# Patient Record
Sex: Male | Born: 1960 | Race: White | Hispanic: No | Marital: Married | Smoking: Never smoker
Health system: Southern US, Community
[De-identification: ages and names within clinical notes are randomized; demographics above are authoritative.]

## PROBLEM LIST (undated history)

## (undated) DIAGNOSIS — M1990 Unspecified osteoarthritis, unspecified site: Secondary | ICD-10-CM

## (undated) DIAGNOSIS — F439 Reaction to severe stress, unspecified: Secondary | ICD-10-CM

## (undated) DIAGNOSIS — F419 Anxiety disorder, unspecified: Secondary | ICD-10-CM

## (undated) DIAGNOSIS — I10 Essential (primary) hypertension: Secondary | ICD-10-CM

## (undated) DIAGNOSIS — G4730 Sleep apnea, unspecified: Secondary | ICD-10-CM

## (undated) DIAGNOSIS — F32A Depression, unspecified: Secondary | ICD-10-CM

## (undated) DIAGNOSIS — I252 Old myocardial infarction: Secondary | ICD-10-CM

## (undated) DIAGNOSIS — E669 Obesity, unspecified: Secondary | ICD-10-CM

## (undated) DIAGNOSIS — F329 Major depressive disorder, single episode, unspecified: Secondary | ICD-10-CM

## (undated) DIAGNOSIS — E785 Hyperlipidemia, unspecified: Secondary | ICD-10-CM

## (undated) DIAGNOSIS — C61 Malignant neoplasm of prostate: Secondary | ICD-10-CM

## (undated) DIAGNOSIS — G4733 Obstructive sleep apnea (adult) (pediatric): Secondary | ICD-10-CM

## (undated) DIAGNOSIS — I2510 Atherosclerotic heart disease of native coronary artery without angina pectoris: Secondary | ICD-10-CM

## (undated) HISTORY — PX: TONSILLECTOMY: SUR1361

## (undated) HISTORY — DX: Essential (primary) hypertension: I10

## (undated) HISTORY — DX: Sleep apnea, unspecified: G47.30

## (undated) HISTORY — DX: Old myocardial infarction: I25.2

## (undated) HISTORY — PX: CORONARY STENT PLACEMENT: SHX1402

## (undated) HISTORY — DX: Obesity, unspecified: E66.9

## (undated) HISTORY — DX: Major depressive disorder, single episode, unspecified: F32.9

## (undated) HISTORY — DX: Unspecified osteoarthritis, unspecified site: M19.90

## (undated) HISTORY — DX: Atherosclerotic heart disease of native coronary artery without angina pectoris: I25.10

## (undated) HISTORY — DX: Hyperlipidemia, unspecified: E78.5

## (undated) HISTORY — DX: Obstructive sleep apnea (adult) (pediatric): G47.33

## (undated) HISTORY — DX: Depression, unspecified: F32.A

## (undated) HISTORY — DX: Anxiety disorder, unspecified: F41.9

## (undated) HISTORY — DX: Reaction to severe stress, unspecified: F43.9

---

## 1980-02-19 HISTORY — PX: KNEE ARTHROSCOPY: SUR90

## 1988-02-19 HISTORY — PX: ANTERIOR CRUCIATE LIGAMENT REPAIR: SHX115

## 2000-06-18 ENCOUNTER — Encounter: Admission: RE | Admit: 2000-06-18 | Discharge: 2000-06-18 | Payer: Self-pay | Admitting: Sports Medicine

## 2000-06-18 ENCOUNTER — Encounter: Payer: Self-pay | Admitting: Sports Medicine

## 2000-06-30 ENCOUNTER — Encounter: Payer: Self-pay | Admitting: Sports Medicine

## 2000-08-14 ENCOUNTER — Encounter: Admission: RE | Admit: 2000-08-14 | Discharge: 2000-08-14 | Payer: Self-pay | Admitting: Sports Medicine

## 2000-08-14 ENCOUNTER — Encounter: Payer: Self-pay | Admitting: Sports Medicine

## 2000-09-01 ENCOUNTER — Encounter: Payer: Self-pay | Admitting: Sports Medicine

## 2000-09-01 ENCOUNTER — Encounter: Admission: RE | Admit: 2000-09-01 | Discharge: 2000-09-01 | Payer: Self-pay | Admitting: Sports Medicine

## 2001-07-09 ENCOUNTER — Encounter: Payer: Self-pay | Admitting: Emergency Medicine

## 2001-07-09 ENCOUNTER — Emergency Department (HOSPITAL_COMMUNITY): Admission: EM | Admit: 2001-07-09 | Discharge: 2001-07-09 | Payer: Self-pay | Admitting: Emergency Medicine

## 2003-02-19 DIAGNOSIS — I252 Old myocardial infarction: Secondary | ICD-10-CM

## 2003-02-19 HISTORY — DX: Old myocardial infarction: I25.2

## 2003-02-23 ENCOUNTER — Inpatient Hospital Stay (HOSPITAL_COMMUNITY): Admission: EM | Admit: 2003-02-23 | Discharge: 2003-02-28 | Payer: Self-pay | Admitting: Emergency Medicine

## 2003-03-14 HISTORY — PX: US ECHOCARDIOGRAPHY: HXRAD669

## 2003-03-21 ENCOUNTER — Encounter (HOSPITAL_COMMUNITY): Admission: RE | Admit: 2003-03-21 | Discharge: 2003-04-19 | Payer: Self-pay | Admitting: Cardiology

## 2004-04-09 ENCOUNTER — Observation Stay (HOSPITAL_COMMUNITY): Admission: EM | Admit: 2004-04-09 | Discharge: 2004-04-10 | Payer: Self-pay | Admitting: Emergency Medicine

## 2006-07-23 ENCOUNTER — Inpatient Hospital Stay (HOSPITAL_BASED_OUTPATIENT_CLINIC_OR_DEPARTMENT_OTHER): Admission: RE | Admit: 2006-07-23 | Discharge: 2006-07-23 | Payer: Self-pay | Admitting: Cardiology

## 2006-07-29 ENCOUNTER — Observation Stay (HOSPITAL_COMMUNITY): Admission: RE | Admit: 2006-07-29 | Discharge: 2006-07-30 | Payer: Self-pay | Admitting: Cardiology

## 2007-10-08 ENCOUNTER — Inpatient Hospital Stay (HOSPITAL_COMMUNITY): Admission: AD | Admit: 2007-10-08 | Discharge: 2007-10-10 | Payer: Self-pay | Admitting: Cardiology

## 2007-10-08 ENCOUNTER — Inpatient Hospital Stay (HOSPITAL_BASED_OUTPATIENT_CLINIC_OR_DEPARTMENT_OTHER): Admission: RE | Admit: 2007-10-08 | Discharge: 2007-10-08 | Payer: Self-pay | Admitting: Cardiology

## 2007-10-09 HISTORY — PX: CARDIAC CATHETERIZATION: SHX172

## 2008-11-23 HISTORY — PX: CARDIOVASCULAR STRESS TEST: SHX262

## 2010-07-03 NOTE — H&P (Signed)
NAME:  [name] [name], LAUNER             ACCOUNT NO.:  [id number]   MEDICAL RECORD NO.:  [id number]           PATIENT TYPE:   LOCATION:                                 FACILITY:   PHYSICIAN:  Colleen Can. [name], M.D.    DATE OF BIRTH:   DATE OF ADMISSION:  10/08/2007  DATE OF DISCHARGE:                              HISTORY & PHYSICAL   CHIEF COMPLAINT:  Chest pain.   HISTORY OF PRESENT ILLNESS:  Mr. Methot is a 50 year old white male who  has known ischemic heart disease.  He has severe single vessel disease  that has been known and has had previous unsuccessful attempts at  revascularization.  He presents to our office after an approximate 14-  month absence with recurrent chest pain.  It is basically been occurring  over the course of the past week.  It is exertional in nature.  For the  past month or so, he has been off all of his medicines, primarily due to  cost reasons.  He has been under more in the way of situational stress.  His discomfort is described as tightness in the mid sternal region that  radiates throughout the precordium.  It is actually worse than his  previous chest pain syndrome.  He has used nitroglycerin with  questionable relief.  He was seen in the office.  Blood pressure was  grossly elevated.  Antihypertensives were reinitiated.  He was seen back  in followup on September 30, 2007 and had had some improvement in blood  pressure control, however, he continued to have some exertional angina.  In light of his ongoing symptomatology and known history of ischemic  heart disease, he is now referred for repeat cardiac catheterization.   PAST MEDICAL HISTORY:  1. Inferior MI in January 2005 and at that time he had stenting of the      right coronary with the 3.5 x 18 mm Cypher stent.  His last cath      was in June 2008 and at that time he had unsuccessful attempts at      angioplasty of the right coronary.  Other findings at that time      showed normal LV function and  30% narrowing in the LAD and left      circumflex.  In the right coronary, he does have left-to-right      collaterals for a large posterior descending and posterolateral      branch.  The right coronary artery itself is somewhat tortuous and      has a severe segmental stenosis prior to the stent and there is in-      stent stenosis.  The distal stent in the posterior descending      appears to be occluded at the origin of the posterior descending      and there were diffuse irregularities and tortuosity in the vessel      overall.  2. Hyperlipidemia.  3. Hypertension.  4. Obesity.  5. Significant situational stress.  6. Degenerative joint disease.  7. History of arthroscopy of the knee.  8. Suspected sleep apnea.  9. Anxiety and depression.   ALLERGIES:  None.   CURRENT MEDICATIONS:  1. Caduet 5/40 daily.  2. Metoprolol 50 mg b.i.d.  3. Aspirin 325 a day.  4. Tricor 145 a day.   FAMILY HISTORY:  Mother has had hypertension.  There are no reports of  myocardial infarction.   SOCIAL HISTORY:  He is married.  He does not smoke and only has rare  alcohol use.  Two of his children have had extensive drug problems.   REVIEW OF SYSTEMS:  Basically as noted above and is otherwise  unremarkable.   PHYSICAL EXAMINATION:  VITAL SIGNS:  His weight is 255, blood pressure  132/90 sitting, 130/90 standing, heart rate 64 and regular, respirations  18, and he is afebrile.  SKIN:  Warm and dry.  Color is unremarkable.  LUNGS:  Basically clear.  HEART:  Regular rhythm.  ABDOMEN:  Obese, soft, positive bowel sounds, and nontender.  EXTREMITIES:  Without edema.  NEUROLOGIC:  No gross focal deficits.   Pertinent labs are pending.   OVERALL IMPRESSION:  1. Unstable angina.  2. Known single-vessel coronary disease with previous unsuccessful      attempts at angioplasty in June 2008.  3. Hypertension.  4. Hyperlipidemia.  5. Obesity.  6. Significant situational stress.   PLAN:  We  will proceed on with diagnostic cardiac catheterization.  Procedure risks and benefits have all been reviewed with the patient and  he is willing to proceed on Thursday, October 08, 2007.      Sharlee Blew, N.P.      Colleen Can. [name], M.D.  Electronically Signed    LC/MEDQ  D:  09/30/2007  T:  10/01/2007  Job:  [id number]

## 2010-07-03 NOTE — Discharge Summary (Signed)
[name] [name], [name] [name]             ACCOUNT NO.:  [id number]   MEDICAL RECORD NO.:  [id number]          PATIENT TYPE:  INP   LOCATION:  2807                         FACILITY:  [facility]   PHYSICIAN:  Colleen Can. [name], M.D.DATE OF BIRTH:  Jan 05, 1961   DATE OF ADMISSION:  10/08/2007  DATE OF DISCHARGE:  10/10/2007                               DISCHARGE SUMMARY   DISCHARGE DIAGNOSES:  1. Chest pain with elective cardiac catheterization performed on      October 08, 2007, showing 95% severe mid lesion in the LAD,      irregularities of the left circumflex with left and right      collaterals, 100% occluded right coronary with an ejection fraction      of 45%-50% and mild inferior and anterior hyperkinesia.  Left main      was normal.  He subsequently was referred for stent placement of      the left anterior descending, which was performed on October 09, 2007.  He has a 3.0 x 15 mm PROMUS stent placed with an overall      satisfactory result obtained.  2. Known atherosclerotic cardiovascular disease with a previous      inferior myocardial infarction in January 2005, and at that time,      he had stenting of the right coronary.  This was subsequently      occluded.  3. Hyperlipidemia.  4. Hypertension.  5. Obesity.  6. Significant situational stress.  7. Degenerative joint disease.  8. Suspected sleep apnea.  9. Anxiety and depression.   HISTORY OF PRESENT ILLNESS:  The patient is a 50 year old white male who  has known ischemic heart disease.  He had severe single-vessel disease  that was known, and he had had prior unsuccessful attempts of  revascularization approximately 14 months ago.  He is visiting our  office after an approximate 1 year absence with recurrent chest pain  that had been occurring over the course of 1 week, it was exertional in  nature.  He had been off all of his medicines primarily due to cost  reasons and had been under more in the way of significant  situational  stress with family issues.  He was subsequently referred for outpatient  cardiac catheterization once his blood pressure was controlled.  He did  continue to have ongoing symptomatology.   Please see the history and physical for further patient presentation and  profile.   LABORATORY DATA:  On admission, CBC was normal.  PT and PTT were  unremarkable.  Chemistry showed a glucose of 106, total cholesterol of  168, triglycerides 235, HDL 43, and LDL of 77.   HOSPITAL COURSE:  The patient was admitted originally as an outpatient  in order to undergo diagnostic cardiac catheterization.  That procedure  was performed on October 08, 2007, without any no known complications.  Ejection fraction was noted to be 45%-50%.  The left main was normal.  The LAD had a severe 95% lesion, mid way.  The left circumflex has  irregularities and the right coronary artery is 100% occluded.  Given  the severity of the nature, the patient was subsequently admitted  overnight, and we proceeded on with attempted stenting of the LAD, the  following morning.  That procedure was also tolerated well without any  no known complications.  A 3.0 x 15 mm PROMUS stent was placed with an  overall excellent result obtained.  Postprocedure, he will be  transferred to 6500, and if he remains stable, he will be able to be  discharged in the morning with further outpatient followup care.   DISCHARGE CONDITION:  Stable.   DISCHARGE DIET:  Low-salt and heart healthy.   WOUND CARE:  He is to use an ice pack as needed to the __________.   DISCHARGE MEDICINES:  1. Plavix 75 mg a day, he is committed to this now for 1 year.  2. Caduet 5/40.  3. Metoprolol 50 mg a day.  4. TriCor 145 a day.  5. Nitroglycerin p.r.n.   We will plan on see him back in the office early next week; certainly,  sooner if any problems arise in the interim.      Sharlee Blew, N.P.      Colleen Can. [name], M.D.   Electronically Signed    LC/MEDQ  D:  10/09/2007  T:  10/09/2007  Job:  [id number]

## 2010-07-03 NOTE — Cardiovascular Report (Signed)
NAMEWILLFORD, [name]             ACCOUNT NO.:  [id number]   MEDICAL RECORD NO.:  [id number]          PATIENT TYPE:  INP   LOCATION:  6525                         FACILITY:  [facility]   PHYSICIAN:  Colleen Can. [name], M.D.DATE OF BIRTH:  27-Dec-1960   DATE OF PROCEDURE:  DATE OF DISCHARGE:                            CARDIAC CATHETERIZATION   PROCEDURE:  Unsuccessful angioplasty attempt of the right coronary  artery.   HISTORY OF PRESENT ILLNESS:  Mr. [name] [name] had had previous inferior  myocardial infarction in January of 2005 with stents to the right  coronary artery and ostial posterior descending.  He had an abnormal  stress Cardiolite study that led to catheterization which showed severe  stenosis in the right coronary artery with the left-to-right collaterals  distally.  He is referred for attempted recanalization of the vessel  leading into the stent.   PROCEDURE:  Attempted angioplasty of the right coronary artery  (unsuccessful).   TYPE AND SITE OF ENTRY:  Percutaneous right femoral artery.   Catheters JR-4 guide, initially without side holes and subsequent with  side holes, initially a Prowater guidewire, and then [employer]acle [employer] 3,  then an extra-support floppy, and subsequently a [place].   MEDICATIONS:  1. Valium 10 mg p.o.  2. Plavix.   MEDICATIONS GIVEN DURING PROCEDURE:  1. Versed total 5 mg IV.  2. AngioMax.  3. Atropine.  4. Dopamine.   COMMENTS:  In general, the patient tolerated the procedure well.  At the  end of the procedure, we had hypotension and bradycardia treated with  atropine and dopamine with resolution.  This was felt to be a  combination of slow flow in the right coronary artery versus possible  vagal reaction.  She did have a vagal reaction while starting an IV  prior to the procedure.   PROCEDURE:  The JR-4 guide provided satisfactory backup.  There was some  damping as procedure went on and as we were changing guidewires we did  change  the JR-4 guide with side holes.  That also provided adequate  backup.  We started with a Prowater guidewire.  However, we did not have  enough pushability to enter into the proximal part of the segmental  stenosis.  We then went with the Mir[employer] 3.  This provided  satisfactory stiffness and torqued ability.  However, as we entered into  the midportion of the stent which had approximately a right angle to it,  we were unable to make the distal turn.  We returned with an extra  support floppy, but that also had difficulty in passing into the more  proximal portions of the lesion with enough strength and stiffness to be  advanced distally.  We did use a 2.5 Maverick balloon to provide support  that did not result in any extra ability to advance guide wires.  We  finished with a mailman guidewire.  As we were advancing that wire and  having difficulty again with the right angle within the stented segment,  Mr. [name] [name] began to have bradycardia, hypotension and diaphoresis.  Angiographically, he had slow flow.  There was no perforation  noted.  We  terminated the procedure at this point in time.  The final  angiographic images show persistence of antegrade flow in a TIMI grade  II manner with distal collaterals present.  The right femoral artery  site was closed with AngioSeal.  The patient was transported to the  holding area in stable condition.      Colleen Can. [name], M.D.  Electronically Signed     SNT/MEDQ  D:  07/29/2006  T:  07/30/2006  Job:  [id number]

## 2010-07-03 NOTE — H&P (Signed)
NAME:  [name] [name], [name] [name]             ACCOUNT NO.:  [id number]   MEDICAL RECORD NO.:  [id number]           PATIENT TYPE:   LOCATION:                               FACILITY:  [facility]   PHYSICIAN:  Colleen Can. [name], M.D.DATE OF BIRTH:  [birth year]   DATE OF ADMISSION:  07/29/2006  DATE OF DISCHARGE:                              HISTORY & PHYSICAL   CHIEF COMPLAINT:  None.   HISTORY OF PRESENT ILLNESS:  Mr. Kudrna is a 50 year old white male who  has known ischemic heart disease.  He has underwent recent diagnostic  cardiac catheterization following an abnormal Cardiolite study.  This  demonstrated severe single vessel disease with severe stenosis in the  right coronary with left-to-right collaterals.  He is now presenting for  elective intervention.  Clinically he has had no complaints of chest  pain.   PAST MEDICAL HISTORY:  1. Inferior MI in January 2005, at that time he had stent placement to      the right coronary with a 3.5 x 18 mm Cypher stent.  His last      catheterization was on July 23, 2006 which showed essentially normal      LV function, severe single vessel disease with a severe stenosis in      the right coronary with left-to-right collaterals distally, but      with severe stenosis within the stented segment of the right      coronary, but also severe stenosis just prior to the origin of the      stent.  2. Hyperlipidemia.  3. Significant situational stress.  4. Obesity.  5. Hypertension.  6. Degenerative joint disease.  7. History of knee arthroscopy.  8. Suspected sleep apnea.   ALLERGIES:  Are none.   CURRENT MEDICATIONS:  1. Metoprolol 50 mg a day.  2. Lisinopril 20 mg a day.  3. Zocor 80 a day.  4. Hydrochlorothiazide 25 a day.  5. Wellbutrin 300 a day.  6. Tricor 145 a day.  7. Multivitamin daily.  8. Lexapro 10 mg a day.  9. Aspirin daily.   FAMILY HISTORY:  Unchanged.   SOCIAL HISTORY:  Unchanged.   REVIEW OF SYSTEMS:  Is unchanged.   PHYSICAL EXAMINATION:  VITAL SIGNS:  His weight 253-1/2, blood pressure  140/90, heart rate 84, respirations 18, afebrile.  Skin is warm and dry.  Color is unremarkable.  LUNGS: Clear.  HEART:  Shows a regular rhythm.  ABDOMEN:  Obese and soft.  Positive bowel sounds, nontender.  EXTREMITIES: Without edema.  NEUROLOGY:  Shows no gross focal deficit.   Repeat labs are pending.   OVERALL IMPRESSION:  1. Severe stenosis in the right coronary artery.  2. Recent abnormal stress Cardiolite.  3. Known ischemic heart disease with previous inferior myocardial      infarction in January 2005.  4. Hypertension.  5. Hyperlipidemia.  6. Obesity.  7. Situational stress.   PLAN:  We will proceed on with attempts at percutaneous coronary  intervention to the right coronary.  The patient has been started on  Plavix 75 mg a day.  Risks, procedure  and benefits have all been  explained and he is willing to proceed on Tuesday July 29, 2006.      Sharlee Blew, N.P.      Colleen Can. [name], M.D.  Electronically Signed    LC/MEDQ  D:  07/25/2006  T:  07/25/2006  Job:  [id number]

## 2010-07-03 NOTE — Cardiovascular Report (Signed)
NAMEAHMET, SCHANK             ACCOUNT NO.:  [id number]   MEDICAL RECORD NO.:  [id number]          PATIENT TYPE:  INP   LOCATION:  2807                         FACILITY:  [facility]   PHYSICIAN:  Colleen Can. [name], M.D.DATE OF BIRTH:  [birth year]   DATE OF PROCEDURE:  DATE OF DISCHARGE:                            CARDIAC CATHETERIZATION   PROCEDURE:  Stent placement in the mid left anterior descending  coronary.   TYPE AND SITE OF ENTRY:  Percutaneous right femoral artery.   CATHETERS:  6-French JL-4 left coronary catheter, 2.5 x 15 mm apex  balloon, Prowater guidewire, 3.0 x 15 mm Promus stent (drug-eluting).   ANTICOAGULANTS:  Aspirin, Plavix, and Angiomax.   SEDATION:  Valium 10 mg p.o. preop and 4 mg of Versed during the  procedure.   COMMENTS:  The patient tolerated the procedure well.  He did have his  classical angina after only short balloon inflations relieved with  deflating the balloon.   PROCEDURE:  Guidewires passed through the stenosis in the left anterior  descending.  We predilated with a 15-mm apex balloon and thought that a  15-mm stent would be satisfactory.  We returned with a Promus stent in  position and inflated it to 16 atmospheres maximum.  This resulted in  full expansion with what appeared to be an excellent and relatively  smooth angiographic result.  The stent was just a fraction of a  millimeter bigger than the native vessel on either end of the stent and  appeared to be fully expanded and so we did not return with post-  dilatation balloon.   OVERALL IMPRESSION:  1. Successful stent placement in the mid left anterior descending with      a drug-eluting stent.      Colleen Can. [name], M.D.  Electronically Signed     SNT/MEDQ  D:  10/09/2007  T:  10/09/2007  Job:  [id number]

## 2010-07-03 NOTE — H&P (Signed)
NAME:  [name] [name], [name] [name]          ACCOUNT NO.:  [id number]   MEDICAL RECORD NO.:  [id number]           PATIENT TYPE:   LOCATION:                               FACILITY:  [facility]   PHYSICIAN:  Colleen Can. [name], M.D.    DATE OF BIRTH:   DATE OF ADMISSION:  07/23/2006  DATE OF DISCHARGE:                              HISTORY & PHYSICAL   CHIEF COMPLAINT:  None.   HISTORY OF PRESENT ILLNESS:  [name] [name] is a 50 year old white male who  has known ischemic heart disease.  He is referred for diagnostic cardiac  catheterization due an abnormal stress Cardiolite study.  He was seen  for his 1-year follow-up visit on Jul 07, 2006.  At that time he was  under a significant amount of stress.  He has gained weight.  He has had  no significant episodes of chest pain.  Stress Cardiolite study was  performed on Jul 18, 2006.  He was able to exercise on the standard  Bruce protocol for a total of 11 minutes and 15 seconds.  He had good  exercise tolerance, however, he was limited by the grade.  His blood  pressure response was adequate.  Clinically, he had no complaints of  chest pain.  His EKG was unremarkable.  His Cardiolite images showed  inferolateral ischemia with mild reduction of ejection fraction at 47%.  He is now referred for repeat catheterization.   PAST MEDICAL HISTORY:  1. Inferior myocardial infarction in January 2005.  At that time he      had stent placement to the right coronary with a 3.5 x 18 mm Cypher      stent.  He had follow-up catheterization in February 2006 which      showed normal LV function.  The stent in the right coronary was      patent.  There were minimal irregularities throughout all three      vessels.  2. Hyperlipidemia.  3. Significant situational stress.  4. Obesity.  5. Hypertension.  6. Degenerative joint disease.  7. History of knee arthroscopy.   ALLERGIES:  NONE.   CURRENT MEDICINES INCLUDE:  Metoprolol 50 mg extended release daily, 20  mg of  lisinopril daily, Zocor 80 mg a day, hydrochlorothiazide 25 mg a  day, Wellbutrin 300 mg a day, TriCor 45 a day, multivitamin daily,  Lexapro 10 mg a day and aspirin daily.   FAMILY HISTORY:  His mother has had hypertension.  There is no previous  report of heart attack.   SOCIAL HISTORY:  He is married.  He has three children.  He has no  tobacco use.  He does have social alcohol use.   REVIEW OF SYSTEMS:  As noted above and is otherwise unremarkable.   PHYSICAL EXAMINATION:  VITAL SIGNS:  His weight is 253-1/2.  Blood  pressure is 140/90 sitting, 130/90 standing.  Heart rate is 84 and  regular, respirations 18, afebrile.  SKIN:  Warm and dry.  Color is unremarkable.  LUNGS:  Basically clear.  HEART:  Shows a regular rhythm.  ABDOMEN:  Obese.  EXTREMITIES: Without edema.  NEUROLOGIC:  No gross focal deficits.   LABORATORIES:  Pertinent labs are pending.   OVERALL IMPRESSION:  1. Abnormal stress Cardiolite study.  2. Known ischemic heart disease with previous inferior myocardial      infarction in January 2005 with subsequent stent placement to the      right coronary.  3. Hypertension.  4. Hyperlipidemia.  5. Significant situational stress.   PLAN:  We will proceed on with repeat cardiac catheterization.  Procedure risks and benefits have all been explained and he is willing  to proceed on Wednesday, June 4.      Sharlee Blew, N.P.      Colleen Can. [name], M.D.  Electronically Signed    LC/MEDQ  D:  07/22/2006  T:  07/22/2006  Job:  [id number]

## 2010-07-03 NOTE — Cardiovascular Report (Signed)
NAME:  [name] [name], [name] [name]             ACCOUNT NO.:  [id number]   MEDICAL RECORD NO.:  [id number]          PATIENT TYPE:  OIB   LOCATION:  1961                         FACILITY:  [facility]   PHYSICIAN:  Colleen Can. [name], M.D.DATE OF BIRTH:  [birth year]   DATE OF PROCEDURE:  07/23/2006  DATE OF DISCHARGE:                            CARDIAC CATHETERIZATION   HISTORY:  Mr. Paparella had an inferior myocardial infarction in January  2005 with stents in the right coronary artery.  He presented for  followup but had an abnormal stress Cardiolite study.  He is referred  for evaluation.   PROCEDURE:  Left heart catheterization with selective coronary  angiography, left ventricular angiography.   TYPE AND SITE OF ENTRY:  Percutaneous right femoral artery.   CATHETERS:  Four-French four curved Judkins right and left coronary  catheters, 4-French pigtail ventriculographic catheter.   CONTRAST MATERIAL:  Omnipaque.   MEDICATIONS GIVEN PRIOR TO PROCEDURE:  Valium 10 mg p.o.   MEDICATIONS GIVEN DURING THE PROCEDURE:  Versed 3 mg IV.   COMMENTS:  The patient tolerated the procedure well.   HEMODYNAMIC DATA:  Aortic pressure of 114/78, LV pressure of 121/0-7.   ANGIOGRAPHIC DATA:  Left ventricular angiography performed in the RAO  position.  Overall cardiac size and silhouette were normal.  Global left  ventricular function showed an ejection fraction of 55-60%.   CORONARY ARTERIES:  1. Left main coronary artery is normal.  2. Left anterior descending had somewhat diffuse 30% narrowing      proximally.  The first and second diagonal vessels were large and      were satisfactory.  3. Left circumflex:  The left circumflex had ostial 30% narrowing.  It      was a moderate-sized vessel.  4. Right coronary artery had left-to-right collaterals for a large      posterior descending and posterolateral branch.  The right coronary      artery itself was somewhat tortuous.  There was severe segmental  stenosis prior to the stent and then there was in-stent stenosis.      There was a distal stent in the posterior descending vessel which      appeared to be occluded at the origin of the posterior descending      vessel.  There was diffuse irregularities and tortuosity in this      vessel.   OVERALL IMPRESSION:  1. Essentially normal left ventricular function.  2. Severe single-vessel coronary disease with severe stenosis in the      right coronary artery with left-to-right collaterals distally but      with severe stenosis within the stented segment of the right      coronary artery but also severe stenosis prior to the origin of the      stent.   DISCUSSION:  We will arrange for elective percutaneous coronary  intervention of the right coronary artery and possible posterior  descending vessel.      Colleen Can. [name], M.D.  Electronically Signed     SNT/MEDQ  D:  07/23/2006  T:  07/23/2006  Job:  [id number]

## 2010-07-03 NOTE — Discharge Summary (Signed)
NAMECLIFFTON, SPRADLEY             ACCOUNT NO.:  [id number]   MEDICAL RECORD NO.:  [id number]          PATIENT TYPE:  INP   LOCATION:  6525                         FACILITY:  [facility]   PHYSICIAN:  Colleen Can. [name], M.D.DATE OF BIRTH:  [birth year]   DATE OF ADMISSION:  07/29/2006  DATE OF DISCHARGE:  07/30/2006                               DISCHARGE SUMMARY   PRIMARY DISCHARGE DIAGNOSIS:  Failed attempts at angioplasty to the  right coronary artery.   SECONDARY DISCHARGE DIAGNOSES:  1. Known severe single-vessel coronary disease with a severe stenosis      in the right coronary artery with left-to-right collaterals.  He      had his last catheterization on July 23, 2006.  He has normal left      ventricular function.  2. Hyperlipidemia.  3. Obesity.  4. Hypertension.  5. Suspected sleep apnea.  6. Significant situational stress.  7. 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   HISTORY OF PRESENT ILLNESS:  The patient is 50 year old white male who  was referred for cardiac catheterization following an abnormal  Cardiolite study.  He has clinically remained without symptoms.  His  catheterization demonstrated severe single vessel disease.  He was  brought in for attempts at angioplasty and revascularization.   Please see the dictated history and physical for further patient  presentation and profile.   LABORATORY DATA:  PT and PTT were unremarkable.  Chemistries were  normal.  CBC was normal.   HOSPITAL COURSE:  The patient was admitted electively.  He had already  been started on Plavix as an outpatient.  We proceeded on with attempts  at angioplasty to the right coronary, however, this was unsuccessful.  The guidewire was unable to the passed across the distal curve in the  right coronary artery after multiple attempts and multiple guidewires.  Procedure was  subsequently aborted.  He did remain stable and was  subsequently watched overnight.  His CK and troponin are negative.  His  groin is unremarkable and he is felt to be a satisfactory candidate for  discharge today.  Further treatment plan to follow for Dr. Ronnald Nian  discretion.   DISCHARGE CONDITION:  Stable.   DISCHARGE MEDICINES:  Will be all as he was taking before which include  1. Metoprolol ER 50 mg a day.  2. Lisinopril 20 mg a day.  3. Zocor 80 mg a day.  4. Hydrochlorothiazide 25 mg a day.  5. Wellbutrin 300 a day.  6. Tricor 145 a day.  7. Multivitamin daily.  8. Lexapro 10 mg a day.  9. Aspirin daily.  10.Plavix daily.   He is to place an ice pack if needed to the groin.   His activity will be increased as tolerated.  He is to return to work on  Monday.  He is to call our office if any problems arise in the interim.      Sharlee Blew, N.P.      Duffy Rhody  Roslynn Amble, M.D.  Electronically Signed    LC/MEDQ  D:  07/30/2006  T:  07/30/2006  Job:  [id number]

## 2010-07-03 NOTE — Cardiovascular Report (Signed)
NAME:  [name] [name], [name] [name]             ACCOUNT NO.:  [id number]   MEDICAL RECORD NO.:  [id number]          PATIENT TYPE:  OIB   LOCATION:  1965                         FACILITY:  [facility]   PHYSICIAN:  Colleen Can. [name], M.D.DATE OF BIRTH:  09-14-1960   DATE OF PROCEDURE:  10/08/2007  DATE OF DISCHARGE:  10/08/2007                            CARDIAC CATHETERIZATION   HISTORY:  Mr. Yoak has had progressive angina over the last 2 weeks.  He has a history of a known occluded right coronary artery with left-to-  right collaterals.   PROCEDURE:  Left heart catheterization with selective coronary  angiography and left ventricular angiography.   TYPE AND SITE OF ENTRY:  Percutaneous right femoral artery.   CATHETERS:  A 4-French 4 curved Judkins left coronary catheter, 4-French  3D RC right coronary catheter, and 4-French pigtail ventriculographic  catheter.   CONTRAST MATERIAL:  Omnipaque.   MEDICATIONS:  Given prior procedure Valium 10 mg p.o.   MEDICATIONS GIVEN PRIOR TO PROCEDURE:  Valium 10 mg p.o.   MEDICATIONS GIVEN DURING THE PROCEDURE:  Versed 4 mg IV.   COMMENTS:  The patient tolerated the procedure well.   HEMODYNAMIC DATA:  The aortic pressure was 123/85, LV was 122/16.  There  is no aortic valve gradient noted on pullback.   ANGIOGRAPHIC DATA:  The left ventricular ejection fraction approximately  45-50%.  There is mild inferior and mild anterior hypokinesis.   CORONARY ARTERIES:  1. Left main coronary artery is normal.  2. Left anterior descending.  The left anterior descending has a      severe 95% stenosis before first diagonal vessel.  It is reasonably      focal and would be well-suited for angioplasty.  3. Intermediate.  Large intermediate vessel free of significant      obstructive disease.  4. Left circumflex.  Left circumflex is moderate size vessel.  It has      irregularities.  5. Right coronary artery.  Right coronary artery is totally occluded  proximally.  There is extensive left-to-right collaterals mainly to      the left circumflex system.   OVERALL IMPRESSION:  1. Severe stenosis in the left anterior descending.  2. Totally occluded right coronary artery with left-to-right      collaterals.  3. Satisfactory intermediate and left circumflex systems.   DISCUSSION:  In light of these findings, it would appear that Mr. Kitchings  should do well with angioplasty of left anterior descending.  This will  be arranged as an elective procedure.      Colleen Can. [name], M.D.  Electronically Signed     SNT/MEDQ  D:  10/08/2007  T:  10/09/2007  Job:  [id number]

## 2010-07-06 NOTE — Discharge Summary (Signed)
NAME:  [name] [name], [name] [name]             ACCOUNT NO.:  [id number]   MEDICAL RECORD NO.:  [id number]          PATIENT TYPE:  INP   LOCATION:  2030                         FACILITY:  [facility]   PHYSICIAN:  Colleen Can. [name], M.D.DATE OF BIRTH:  [birth year]   DATE OF ADMISSION:  04/09/2004  DATE OF DISCHARGE:  04/10/2004                                 DISCHARGE SUMMARY   PRIMARY DISCHARGE DIAGNOSIS:  Atypical chest pain with negative cardiac  enzymes with subsequent elective cardiac catheterization showing normal LV  function, persistent patency in the stent in the right coronary with minimal  irregularities scattered throughout all three vessels with the greatest  lesion being approximately 40% ostial left circumflex.  In light of these  findings, he will continue with medical management.  It was doubtful that  his chest pain syndrome was related to coronary disease.   SECONDARY DISCHARGE DIAGNOSES:  1.  Known history of atherosclerotic cardiovascular disease with a previous      history of inferior wall MI in January 2005.  At that time, he had      stents placed to the right coronary and posterior descending.  2.  Hypertension.  3.  Obesity.  4.  Hyperlipidemia.   HISTORY OF PRESENT ILLNESS:  The patient is a 50 year old white male who  presented to our office after prolonged and steady episode of chest  discomfort.  It began at approximately 11:30 a.m.  Initially, it was  somewhat sharp and stabby but then became somewhat dull and persistent.  He  had no other associated symptoms.  He was subsequently seen in the office  and referred on to the hospital for further evaluation.   Please see the dictated H&P for further patient presentation and profile.   LABORATORY DATA ON ADMISSION:  Chest x-ray showed no active disease.  There  was no evidence of pulmonary edema.  Cardiac enzymes were negative with  negative cardiac markers.  CBC was normal.  Chemistries were normal.  SGPT  slightly  elevated at 44.   Total cholesterol showed a total cholesterol of 147, triglycerides 252.  LDL  was 55 and HDL was 42.  PT and PTT were unremarkable.   A 12-lead electrocardiogram showed no acute changes with evidence of an old  inferior MI.   HOSPITAL COURSE:  The patient was admitted electively.  He was placed on IV  heparin.  His home medicines were continued.  We proceeded on with cardiac  catheterization the following afternoon.  He had no reoccurrence of chest  pain during this admission.  Cardiac catheterization was carried out and  tolerated without any known complications.  The results are as noted above.  It was felt that the chest pain he had presented with was not cardiac in  nature, and he will be continued to be followed along clinically.   DISCHARGE CONDITION:  Stable.   DISCHARGE MEDICATIONS:  He will resume all of his previous medicines at this  point in time.  No changes are made.   DISCHARGE INSTRUCTIONS:  Will plan on seeing him back in the office in  approximately two  weeks, certainly sooner if problems would arise.  His  activity is to be progressive in nature and appropriate wound care  information was given.      LC/MEDQ  D:  04/11/2004  T:  04/11/2004  Job:  [id number]

## 2010-07-06 NOTE — Discharge Summary (Signed)
NAME:  [name] [name], [name] [name]                       ACCOUNT NO.:  [id number]   MEDICAL RECORD NO.:  [id number]                   PATIENT TYPE:  INP   LOCATION:  2009                                 FACILITY:  [facility]   PHYSICIAN:  Colleen Can. [name], M.D.            DATE OF BIRTH:  18-Aug-1960   DATE OF ADMISSION:  02/23/2003  DATE OF DISCHARGE:  02/28/2003                                 DISCHARGE SUMMARY   PRIMARY DISCHARGE DIAGNOSES:  1. Chest pain in the setting of an inferior myocardial infarction with     subsequent stent placement to the right coronary with a 3.5 x 18 mm as     well as a 2.5 x 9 mm Cypher stent.  2. Hyperlipidemia with current initiation of statin therapy.  3. Hypertension, currently controlled.   HISTORY OF PRESENT ILLNESS:  Mr. Lezcano is a 50 year old nonsmoker, white  male who presents with the onset of chest pain. He was seen and evaluated in  the emergency room and noted to have abnormal 12-lead echocardiogram. His  chest pain occurred while he was participating in [name] jousting. He was  subsequently seen and evaluated and taken emergently to the cardiac  catheterization lab.   Please see the dictated history and physical for further patient  presentation and profile.   LABORATORY DATA:  Peak MB was 158, troponin peak was 30. Chemistries were  normal except for glucose of 135. CBC was normal.   HOSPITAL COURSE:  The patient was admitted to the coronary care unit after  undergoing emergent cardiac catheterization. That procedure was performed  without any noted complication. There was an ulcerated plaque in the mid  portion of the right coronary. There was a 95+ percent narrowing in the  posterior descending. A subsequent 3.5 x 18 mm as well as 2.5 x 9 mm Cypher  stents were placed with overall satisfactory results obtained. The left main  was normal. The LAD has irregularities with a high bifurcating diagonal. The  left circumflex has irregularities. The  patient subsequently received  Angioseal and was transferred to the coronary care unit. He basically  progressed quite nicely throughout the remainder of his hospitalization. He  has been up and ambulatory without problems. He has had no further chest  pain. The groin has remained unremarkable, and today on February 28, 2003, he  is doing well and is felt to be a stable candidate for discharge.   DISCHARGE CONDITION:  Stable.   DISCHARGE MEDICATIONS:  1. Plavix 75 mg a day for a total of 6 months.  2. Aspirin daily.  3. Lopressor 50 mg a half a tablet b.i.d.  4. Lipitor 10 q.d.  5. Altace 2.5 daily.  6. Nitroglycerin p.r.n.   ACTIVITY:  His activity is to be light with no driving and no sexual  intercourse. He may walk 10 minutes twice a day.   FOLLOW UP:  We will see him back in  the office in approximately 10 days. He  is asked to call to schedule that appointment.      Juanell Fairly C. Earl Gala, N.P.                 Colleen Can. [name], M.D.    LCO/MEDQ  D:  02/28/2003  T:  02/28/2003  Job:  [id number]

## 2010-07-06 NOTE — H&P (Signed)
NAME:  [name] [name]                       ACCOUNT NO.:  [id number]   MEDICAL RECORD NO.:  [id number]                   PATIENT TYPE:  INP   LOCATION:  1829                                 FACILITY:  [facility]   PHYSICIAN:  Colleen Can. [name], M.D.            DATE OF BIRTH:  08-10-1960   DATE OF ADMISSION:  02/23/2003  DATE OF DISCHARGE:                                HISTORY & PHYSICAL   HISTORY:  Mr. [name] [name] is a 50 year old male who presents with sudden onset of  substernal chest pain.  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  He had onset of substernal chest pain  with shortness of breath and mild diaphoresis.  He presents to the emergency  room after having approximately 30 minutes of chest discomfort.  He has not  had similar symptoms to this before but has had some intermittent episodes  of sweats over the last few weeks.  He has not had previous chest pain.  Chest pain has been partially relieved in the emergency room and clearly is  much improved.  Initially ST segments were elevated, but have improved  somewhat with administration of nitroglycerin.   PAST MEDICAL HISTORY:  1. Mild hypertension.  2. Knee arthroscopy.   ALLERGIES:  None.   CURRENT MEDICATIONS:  None.   SOCIAL HISTORY:  He is married and has three children.  The oldest is 57  Sharlet Salina).  He is a nonsmoker, rare social alcohol.   FAMILY HISTORY:  Mother has had hypertension.  There is no previous  myocardial infarction.   REVIEW OF SYSTEMS:  Essentially negative otherwise.  He does get vagal with  medical procedures quite easily.   PHYSICAL EXAMINATION:  VITAL SIGNS:  Heart rate 50, blood pressure 120/70.  SKIN:  Warm and dry, mildly diaphoretic.  HEENT:  Negative.  LUNGS:  Clear.  HEART:  Regular rate and rhythm.  ABDOMEN: Soft, nontender.  EXTREMITIES:  Peripheral p pulses are intact.   LABORATORY DATA:  EKG shows changes compatible with acute  inferior  myocardial infarction with reciprocal ST changes.   OVERALL IMPRESSION:  1. Acute inferior myocardial infarction.  2. History of mild hypertension.  3. Considerable medical history currently unknown.   PLAN:  We will proceed to the cardiac catheterization lab emergently.  Procedure, risks, and benefits have been explained, and the patient is  willing to proceed.                                                Colleen Can. [name], M.D.    SNT/MEDQ  D:  02/23/2003  T:  02/23/2003  Job:  [id number]

## 2010-07-06 NOTE — Cardiovascular Report (Signed)
NAME:  [name] [name], [name] [name]             ACCOUNT NO.:  [id number]   MEDICAL RECORD NO.:  [id number]          PATIENT TYPE:  INP   LOCATION:  2030                         FACILITY:  [facility]   PHYSICIAN:  Colleen Can. [name], M.D.DATE OF BIRTH:  1961-02-09   DATE OF PROCEDURE:  04/10/2004  DATE OF DISCHARGE:                              CARDIAC CATHETERIZATION   HISTORY:  Mr. Hardgrove presents with atypical chest pain.  He had previous  stents one year ago.  He is referred for catheterization.  Myocardial  infarction was ruled out.   PROCEDURE:  Left heart catheterization with selective coronary angiography,  left ventricular angiography with Angio-Seal.   TYPE AND SITE OF ENTRY:  Percutaneous, right femoral artery.   CATHETERS:  6 French 4 curved Judkins right and left coronary catheters; 6  French pigtail ventriculographic catheter.   CONTRAST MATERIAL:  Omnipaque.   MEDICATIONS GIVEN PRIOR TO PROCEDURE:  Valium 10 mg p.o.   MEDICATIONS GIVEN DURING THE PROCEDURE:  Versed 5 mg IV, Ancef 1 g IV.   COMMENTS:  The patient tolerated the procedure well.   HEMODYNAMIC DATA:  1.  The aortic pressure was 110/70.  2.  LV was 110/0-10.  3.  There are no aortic valve gradient noted on pullback.   ANGIOGRAPHIC DATA:  1.  The right coronary artery is a large dominant vessel.  Stents are      visible.  There is mild irregularities with less than 10-20% narrowing      of the right coronary artery.  Stents were patent.  2.  The left main coronary artery is normal.  3.  The left circumflex had a 40% ostial narrowing of the left circumflex.      He has a relatively large vessel and otherwise free of significant      obstructive disease.  4.  Left anterior descending:  The left anterior descending has a high      diagonal vessel and two other diagonal vessels in the mid portion.      There are irregularities in the proximal mid portion of the left      anterior descending, but no obstructive disease  is present.   Left ventricular angiogram was performed in  RAO position.  Overall cardiac  size and silhouette are normal.  The global ejection is estimated to be at  70%.  Regional wall motion is normal.   OVERALL IMPRESSION:  1.  Normal left ventricular function.  2.  Persistent patency of the stent in the right coronary artery with      minimal irregularities scattered throughout all three vessels with the      greatest lesion being approximately a 40% ostial left circumflex      narrowing.   DISCUSSION:  In light of these, will continue medical  management.  It is  doubtful that Mr. Krauss's chest pain syndrome was in fact related to  coronary disease.      SNT/MEDQ  D:  04/10/2004  T:  04/10/2004  Job:  [id number]

## 2010-07-06 NOTE — H&P (Signed)
NAME:  [name] [name], [name] [name]             ACCOUNT NO.:  [id number]   MEDICAL RECORD NO.:  [id number]          PATIENT TYPE:  EMS   LOCATION:  MAJO                         FACILITY:  [facility]   PHYSICIAN:  Colleen Can. [name], M.D.DATE OF BIRTH:  Sep 28, 1960   DATE OF ADMISSION:  04/09/2004  DATE OF DISCHARGE:                                HISTORY & PHYSICAL   CHIEF COMPLAINT:  Chest pain.   HISTORY OF PRESENT ILLNESS:  Mr. Platten is a 50 year old white male who has  known history of ischemic heart disease.  He presents to the office as a  work-in appointment this afternoon.  He has had a constant steady-type  discomfort since 11:30 this morning.  It is questionable if it is similar to  his previous chest pain syndrome.  Initially it started out as a stabbing  pain over the right side of his chest.  He took nitroglycerin which only  gave him a headache.  He has not been short of breath or diaphoretic after  that.  He was able to eat lunch but then his discomfort was somewhat dull.  It has been lingering on.  He was seen in the office at approximately 3  o'clock p.m.  He was placed on the monitor which was unremarkable.  He  continued to have persistent and dull discomfort which was really difficult  to sort out his symptomatology.  He has a known history of ischemic heart  disease with previous MI one years ago.  He is subsequently admitted for  further evaluation.   PAST MEDICAL HISTORY:  1.  Atherosclerotic cardiovascular disease, inferior MI January of 2005,      with stent placement to the right coronary artery/posterior descending.      He had minimal disease in the left system at that time.  He had a 3.5 x      18 mm CYPHER stent placed in the right coronary and then a subsequent      2.5 x 9 mm CYPHER stent to the posterior descending.  2.  Hypertension.  3.  Obesity.  4.  Hyperlipidemia.  5.  History of knee surgery.   ALLERGIES:  No known drug allergies.   CURRENT MEDICATIONS:  1.  Multivitamin daily.  2.  Lexapro 10 mg a day.  3.  Lopressor 25 b.i.d.  4.  Aspirin daily.  5.  Altace 5 mg a day.  6.  Lipitor 20 mg a day.   FAMILY HISTORY:  Unchanged.   SOCIAL HISTORY:  Unchanged.   REVIEW OF SYMPTOMS:  As noted above and otherwise unremarkable.  He has had  no recent fever or flu.  No cough, no injury.  He is very active with  [name] jousting.  He has had no frank syncope.   PHYSICAL EXAMINATION:  GENERAL APPEARANCE:  He is a pleasant young white  male in no acute distress.  VITAL SIGNS:  Blood pressure 130/90 sitting, 130/100 standing.  Heart rate  60 and regular.  Weight 241 pounds.  SKIN:  Warm and dry.  Color unremarkable.  LUNGS:  Lungs are basically clear.  CARDIOVASCULAR:  Heart shows regular rhythm.  He does have chest wall  tenderness on the right side.  ABDOMEN:  Soft, positive bowel sounds, nontender.  EXTREMITIES:  Without edema.  NEUROLOGIC:  Intact.   PERTINENT LABORATORY DATA:  Pending.   EKG shows no acute changes.   OVERALL IMPRESSION:  1.  Persistent episode of chest pain.  2.  Known ischemic heart disease.  Previous inferior myocardial infarction      with stents to the right coronary artery/posterior descending.  3.  Hypertension.  4.  Hyperlipidemia.  5.  Obesity.   PLAN:  Will proceed on with admission to the hospital.  He is placed on IV  heparin.  His home medications are continued.  We may need to proceed on  with cardiac catheterization on Tuesday.  He will be ruled out for cardiac  enzymes.  Further treatment plan to follow per Dr. Ronnald Nian discretion.      LC/MEDQ  D:  04/09/2004  T:  04/09/2004  Job:  [id number]

## 2010-07-06 NOTE — Cardiovascular Report (Signed)
NAME:  [name] [name], [name] [name]                       ACCOUNT NO.:  [id number]   MEDICAL RECORD NO.:  [id number]                   PATIENT TYPE:  INP   LOCATION:  2309                                 FACILITY:  [facility]   PHYSICIAN:  Colleen Can. [name], M.D.            DATE OF BIRTH:  [birth year]   DATE OF PROCEDURE:  02/23/2003  DATE OF DISCHARGE:                              CARDIAC CATHETERIZATION   HISTORY:  Mr. Delane Ginger presents with an acute inferior myocardial infarction.   PROCEDURE:  Left heart catheterization with selective coronary angiography,  left ventricular angiography and stent placement sequentially in the right  coronary artery and posterior descending.   TYPE AND SITE OF ENTRY:  Percutaneous, right femoral artery with AngioSeal.   CATHETERS:  6-French 4 curved Judkins right and left coronary catheters, 6-  French pigtail ventriculographic catheter, 6-French JR4 guide, high-torque  floppy guidewire, subsequent Luge wire, initially a 3.5 x 18 mm Cypher stent  with follow-up angioplasty proximally with a 4.0 x 15 mm Maverick balloon,  distally a 2.5 x 9 mm Cypher stent after initial angioplasty with a 2.25 x 9  mm Maverick balloon.   CONTRAST MATERIAL:  Omnipaque.   MEDICATIONS GIVEN PRIOR TO THE PROCEDURE:  Valium 10 mg p.o., heparin, IV  nitroglycerin.   MEDICATIONS GIVEN DURING THE PROCEDURE:  Versed, Integrilin, Plavix, Ancef.   COMMENTS:  The patient in general tolerated the procedure well.   HEMODYNAMIC DATA:  1. The aortic pressure was 130/70.  2. LV was 130/10.  3. There was no aortic valve gradient noted on pullback.   ANGIOGRAPHIC DATA:  1. Left main coronary artery is normal.  2. Left circumflex had irregularities, is moderate size vessel.  3. Left anterior descending had diffuse irregularities, no greater than 20-     30% narrowing.  There was a large bifurcating proximal diagonal branch.  4. Right coronary artery:  The right coronary artery is a very large  vessel.     There is an ulcerated plaque in the mid portion.  Posterior descending     vessel had a 95%+ stenosis in proximal portions.   LEFT VENTRICULOGRAM:  Left ventricular angiogram was performed after the  angioplasty and stents.  It showed very minimal inferior hypokinesia.   ANGIOPLASTY PROCEDURE:  We initially changed for a JR4 guide and high-torque  floppy guidewire.  This passed across the initial lesion.  This was in the  mid portion of the right coronary artery before the acute margin.  The 3.5 x  18 mm Cypher stent was appropriate length.  It was dilated initially to 18  atmospheres, subsequently returned with a 4.0 x 15 mm Maverick balloon with  full inflation and dilatation of the stent.  Subsequently, an excellent  result was noted.  We then turned our attention to the posterior descending.  It was difficult to get the guidewire to pass into the main body of the  posterior descending.  Instead I selected a side branch.  However, this was  adequate.  We initially pre dilated with a 2.25 x 9 mm Maverick balloon,  then returned with a 2.5 x 9 mm Cypher stent inflated to a maximum of 18  atmospheres.  The final angiographic result was felt to be excellent.   We then performed AngioSeal after the procedure.  The patient tolerated  well.   OVERALL IMPRESSION:  1. Acute inferior myocardial infarction with disrupted plaque in the large     mid portion of the right coronary artery and a 99% stenosis distally.  2. Mild inferior hypokinesis.  3. Mild coronary atherosclerosis in the left system.                                               Colleen Can. [name], M.D.    SNT/MEDQ  D:  02/23/2003  T:  02/24/2003  Job:  [id number]

## 2010-10-09 ENCOUNTER — Telehealth: Payer: Self-pay | Admitting: Nurse Practitioner

## 2010-10-09 DIAGNOSIS — I2510 Atherosclerotic heart disease of native coronary artery without angina pectoris: Secondary | ICD-10-CM

## 2010-10-09 NOTE — Telephone Encounter (Signed)
Former Dr. [name] pt called to re-establish and wants to see [name] [name], last seen 11/14/2008.  Pt not sure if he needs to schedule for fasting labs prior to his 8/29 appt.  Please call pt back to confirm whether or not he needs labs to be scheduled.  Chart in box.  Pt aware will not receive call until tomorrow.

## 2010-10-11 ENCOUNTER — Encounter: Payer: Self-pay | Admitting: Nurse Practitioner

## 2010-10-11 NOTE — Telephone Encounter (Signed)
Lm that we will get fasting lab work. Needs to arrive at 8:30 for lab on 8/29

## 2010-10-17 ENCOUNTER — Other Ambulatory Visit: Payer: Self-pay | Admitting: Nurse Practitioner

## 2010-10-17 ENCOUNTER — Encounter: Payer: Self-pay | Admitting: Nurse Practitioner

## 2010-10-17 ENCOUNTER — Ambulatory Visit (INDEPENDENT_AMBULATORY_CARE_PROVIDER_SITE_OTHER): Payer: 59 | Admitting: Nurse Practitioner

## 2010-10-17 ENCOUNTER — Other Ambulatory Visit (INDEPENDENT_AMBULATORY_CARE_PROVIDER_SITE_OTHER): Payer: 59 | Admitting: *Deleted

## 2010-10-17 VITALS — BP 180/100 | HR 80 | Ht 70.0 in | Wt 253.0 lb

## 2010-10-17 DIAGNOSIS — F32A Depression, unspecified: Secondary | ICD-10-CM

## 2010-10-17 DIAGNOSIS — I2510 Atherosclerotic heart disease of native coronary artery without angina pectoris: Secondary | ICD-10-CM

## 2010-10-17 DIAGNOSIS — F3289 Other specified depressive episodes: Secondary | ICD-10-CM

## 2010-10-17 DIAGNOSIS — E785 Hyperlipidemia, unspecified: Secondary | ICD-10-CM

## 2010-10-17 DIAGNOSIS — I10 Essential (primary) hypertension: Secondary | ICD-10-CM

## 2010-10-17 DIAGNOSIS — F439 Reaction to severe stress, unspecified: Secondary | ICD-10-CM

## 2010-10-17 DIAGNOSIS — Z733 Stress, not elsewhere classified: Secondary | ICD-10-CM

## 2010-10-17 DIAGNOSIS — F329 Major depressive disorder, single episode, unspecified: Secondary | ICD-10-CM

## 2010-10-17 LAB — BASIC METABOLIC PANEL
BUN: 18 mg/dL (ref 6–23)
CO2: 31 mEq/L (ref 19–32)
Calcium: 9.8 mg/dL (ref 8.4–10.5)
Chloride: 103 mEq/L (ref 96–112)
Creatinine, Ser: 1.2 mg/dL (ref 0.4–1.5)
GFR: 69.67 mL/min (ref >60.00)
Glucose, Bld: 98 mg/dL (ref 70–99)
Potassium: 5.2 mEq/L — ABNORMAL HIGH (ref 3.5–5.1)
Sodium: 143 mEq/L (ref 135–145)

## 2010-10-17 LAB — LIPID PANEL
Cholesterol: 260 mg/dL — ABNORMAL HIGH (ref 0–200)
HDL: 48.5 mg/dL (ref >39.00)
Total CHOL/HDL Ratio: 5
Triglycerides: 436 mg/dL — ABNORMAL HIGH (ref 0.0–149.0)
VLDL: 87.2 mg/dL — ABNORMAL HIGH (ref 0.0–40.0)

## 2010-10-17 LAB — HEPATIC FUNCTION PANEL
ALT: 44 U/L (ref 0–53)
AST: 42 U/L — ABNORMAL HIGH (ref 0–37)
Albumin: 4.8 g/dL (ref 3.5–5.2)
Alkaline Phosphatase: 92 U/L (ref 39–117)
Bilirubin, Direct: 0.1 mg/dL (ref 0.0–0.3)
Total Bilirubin: 0.7 mg/dL (ref 0.3–1.2)
Total Protein: 7.9 g/dL (ref 6.0–8.3)

## 2010-10-17 LAB — LDL CHOLESTEROL, DIRECT: Direct LDL: 136.6 mg/dL

## 2010-10-17 MED ORDER — HYDROCHLOROTHIAZIDE 25 MG PO TABS: 25.0000 mg | ORAL_TABLET | Freq: Every day | ORAL | Status: DC

## 2010-10-17 MED ORDER — SIMVASTATIN 40 MG PO TABS: 40.0000 mg | ORAL_TABLET | Freq: Every evening | ORAL | Status: DC

## 2010-10-17 MED ORDER — LISINOPRIL 10 MG PO TABS: 10.0000 mg | ORAL_TABLET | Freq: Every day | ORAL | Status: DC

## 2010-10-17 NOTE — Assessment & Plan Note (Signed)
Currently asymptomatic 

## 2010-10-17 NOTE — Assessment & Plan Note (Signed)
Blood pressure is grossly elevated. Lisinopril 10 and HCTZ 25 mg is started back. I will see him again in 10 days. We will need to try and use $4 meds.

## 2010-10-17 NOTE — Progress Notes (Signed)
    [name] [name] Date of Birth: 05/08/1960   History of Present Illness: [name] [name] is seen back today after a 2 year absence. He is a former patient of Dr. Ronnald Nian. He will be followed by Dr. Shirlee Latch. He is currently not having chest pain. He stopped all of his medicines about 1 year ago due to cost and "not caring". He has had a lot of stress with his kids, wife and job. His kids are addicted to drugs. His wife had an affair. He now wants to get back on track. He has started back walking some. 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 He is in counseling. He does not check his blood pressure but knows it is back up.   Current Outpatient Prescriptions on File Prior to Visit  Medication Sig Dispense Refill  . aspirin 325 MG tablet Take 325 mg by mouth daily.          No Known Allergies  Past Medical History  Diagnosis Date  . Hyperlipidemia   . Hypertension   . Coronary artery disease   . Obesity   . 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 Stress   . DJD (degenerative joint disease)   . Sleep apnea   . Anxiety   . Depression     Past Surgical History  Procedure Date  . Cardiac catheterization 10/09/2007    EF 45-50%  . Coronary stent placement     RIGHT CORONARY THAT SUBSEQUENTLY OCCLUDED.  [name] Coronary stent placement     LAD WITH A 3.0X15MM PROMUS DRUG-ELUTING STENT PLACED.  [name] Knee arthroscopy   . US echocardiography 03/14/2003    EF 55-60%  . Cardiovascular stress test 11/23/2008    EF 55%    History  Smoking status  . Never Smoker   Smokeless tobacco  . Not on file    History  Alcohol Use No    Family History  Problem Relation Age of Onset  . Hypertension Mother     Review of Systems: The review of systems is as above.  All other systems were reviewed and are negative.  Physical Exam: BP 164/108  Pulse 80  Ht 5\' 10"  (1.778 m)  Wt 253 lb (114.76 kg)  BMI 36.30 kg/m2 Patient is very pleasant and in no acute distress. He is  obese.  Skin is warm and dry. Color is normal.  HEENT is unremarkable. Normocephalic/atraumatic. PERRL. Sclera are nonicteric. Neck is supple. No masses. No JVD. Lungs are clear. Cardiac exam shows a regular rate and rhythm. Abdomen is obese but soft. Extremities are without edema. Gait and ROM are intact. No gross neurologic deficits noted.   LABORATORY DATA: PENDING   Assessment / Plan:

## 2010-10-17 NOTE — Patient Instructions (Signed)
Lets get back on some blood pressure and cholesterol medicines I have sent prescriptions for the drug store for Lisinopril 10 mg daily, HCTZ 25 mg daily and Zocor 40 mg daily I will see you back in about 10 to 14 days Call for any problems

## 2010-10-17 NOTE — Assessment & Plan Note (Signed)
Seems to be back on track. I don't get the feeling that he is depressed at this time.

## 2010-10-17 NOTE — Assessment & Plan Note (Signed)
Labs are checked today. Zocor is restarted.

## 2010-10-18 ENCOUNTER — Telehealth: Payer: Self-pay | Admitting: *Deleted

## 2010-10-18 NOTE — Telephone Encounter (Signed)
Message copied by Lorayne Bender on Thu Oct 18, 2010  4:08 PM ------      Message from: Rosalio Macadamia      Created: Wed Oct 17, 2010  4:24 PM       Ok to report. He was started back on Zocor at his office visit.

## 2010-10-18 NOTE — Telephone Encounter (Signed)
Lm w/lab results. Will send copy to Dr. Collins Scotland.

## 2010-10-23 NOTE — Progress Notes (Signed)
Agree with getting him back on meds if he will agree to this.  Jeet Shough [employer]

## 2010-10-30 ENCOUNTER — Other Ambulatory Visit (INDEPENDENT_AMBULATORY_CARE_PROVIDER_SITE_OTHER): Payer: 59 | Admitting: *Deleted

## 2010-10-30 ENCOUNTER — Encounter: Payer: Self-pay | Admitting: Nurse Practitioner

## 2010-10-30 ENCOUNTER — Ambulatory Visit (INDEPENDENT_AMBULATORY_CARE_PROVIDER_SITE_OTHER): Payer: 59 | Admitting: Nurse Practitioner

## 2010-10-30 DIAGNOSIS — I10 Essential (primary) hypertension: Secondary | ICD-10-CM

## 2010-10-30 DIAGNOSIS — E785 Hyperlipidemia, unspecified: Secondary | ICD-10-CM

## 2010-10-30 DIAGNOSIS — I2510 Atherosclerotic heart disease of native coronary artery without angina pectoris: Secondary | ICD-10-CM

## 2010-10-30 MED ORDER — SIMVASTATIN 40 MG PO TABS: 40.0000 mg | ORAL_TABLET | Freq: Every evening | ORAL | Status: DC

## 2010-10-30 MED ORDER — HYDROCHLOROTHIAZIDE 25 MG PO TABS: 25.0000 mg | ORAL_TABLET | Freq: Every day | ORAL | Status: DC

## 2010-10-30 MED ORDER — LISINOPRIL 20 MG PO TABS: 20.0000 mg | ORAL_TABLET | Freq: Every day | ORAL | Status: DC

## 2010-10-30 MED ORDER — METOPROLOL TARTRATE 25 MG PO TABS: 25.0000 mg | ORAL_TABLET | Freq: Two times a day (BID) | ORAL | Status: DC

## 2010-10-30 NOTE — Assessment & Plan Note (Signed)
No symptoms 

## 2010-10-30 NOTE — Progress Notes (Signed)
    [name] [name] Date of Birth: 1960/06/02   History of Present Illness: [name] [name] is seen today for a 2 week check. He is seen for Dr. Shirlee Latch. We are getting him back on his medicines. He feels ok. He is not able to check his blood pressure at home. No chest pain. No shortness of breath. He is tolerating his current regimen. He is back on his statin. He needs to try and stay on $4 meds for compliance.   Current Outpatient Prescriptions on File Prior to Visit  Medication Sig Dispense Refill  . aspirin 325 MG tablet Take 325 mg by mouth daily.        [name] DISCONTD: hydrochlorothiazide 25 MG tablet Take 1 tablet (25 mg total) by mouth daily.  30 tablet  6  . DISCONTD: simvastatin (ZOCOR) 40 MG tablet Take 1 tablet (40 mg total) by mouth every evening.  30 tablet  11    No Known Allergies  Past Medical History  Diagnosis Date  . Hyperlipidemia   . Hypertension   . Coronary artery disease     Prior inferior MI in 2005 with stent to RCA that subsequently occluded. Last cath in 2009 with stent to to LAD. EF 45 to 50%  . Obesity   . 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 Stress   . DJD (degenerative joint disease)   . Sleep apnea   . Anxiety   . Depression     Past Surgical History  Procedure Date  . Cardiac catheterization 10/09/2007    EF 45-50%  . Coronary stent placement     RIGHT CORONARY THAT SUBSEQUENTLY OCCLUDED.  [name] Coronary stent placement     LAD WITH A 3.0X15MM PROMUS DRUG-ELUTING STENT PLACED.  [name] Knee arthroscopy   . US echocardiography 03/14/2003    EF 55-60%  . Cardiovascular stress test 11/23/2008    EF 55%    History  Smoking status  . Never Smoker   Smokeless tobacco  . Not on file    History  Alcohol Use No    Family History  Problem Relation Age of Onset  . Hypertension Mother     Review of Systems: The review of systems is as above.  All other systems were reviewed and are negative.  Physical Exam: BP 150/100  Pulse 86  Ht 5' 10.5" (1.791 m)  Wt  256 lb 9.6 oz (116.393 kg)  BMI 36.30 kg/m2 Patient is very pleasant and in no acute distress. He is obese. Skin is warm and dry. Color is normal.  HEENT is unremarkable. Normocephalic/atraumatic. PERRL. Sclera are nonicteric. Neck is supple. No masses. No JVD. Lungs are clear. Cardiac exam shows a regular rate and rhythm. Abdomen is soft. Extremities are without edema. Gait and ROM are intact. No gross neurologic deficits noted.   LABORATORY DATA: BMET is pending   Assessment / Plan:

## 2010-10-30 NOTE — Assessment & Plan Note (Signed)
Back on statin therapy.

## 2010-10-30 NOTE — Assessment & Plan Note (Signed)
Blood pressure remains up. Need to try and stay with $4 meds. I have added Metoprolol 25 mg BID and doubled his Lisinopril to 20mg . Need to recheck BMET today and look at his potassium level. Will see him back in 3 weeks. Patient is agreeable to this plan and will call if any problems develop in the interim.

## 2010-10-30 NOTE — Patient Instructions (Signed)
We are going to increase your Lisinopril to 20 mg per day. Use 2 of your 10 mg tablets to make this dose.  All your prescriptions are at the drug store We are going to start you on Metoprolol 25 mg take two times a day I will see you back in about 3 weeks. Call for any problems.

## 2010-10-31 LAB — BASIC METABOLIC PANEL
BUN: 15 mg/dL (ref 6–23)
CO2: 33 mEq/L — ABNORMAL HIGH (ref 19–32)
Calcium: 9.3 mg/dL (ref 8.4–10.5)
Chloride: 100 mEq/L (ref 96–112)
Creatinine, Ser: 1.1 mg/dL (ref 0.4–1.5)
GFR: 71.78 mL/min (ref >60.00)
Glucose, Bld: 117 mg/dL — ABNORMAL HIGH (ref 70–99)
Potassium: 4.6 mEq/L (ref 3.5–5.1)
Sodium: 140 mEq/L (ref 135–145)

## 2010-11-01 ENCOUNTER — Telehealth: Payer: Self-pay | Admitting: *Deleted

## 2010-11-01 NOTE — Telephone Encounter (Signed)
Notified of lab results. Will send to [name] [name]. Was wanting to know when fasting labs would be rechecked. [name] [name] will probably order them when she sees him in Oct. To follow up w/[name] [name].

## 2010-11-01 NOTE — Telephone Encounter (Signed)
Message copied by Lorayne Bender on Thu Nov 01, 2010 11:21 AM ------      Message from: Rosalio Macadamia      Created: Wed Oct 31, 2010  3:53 PM       Ok to report. Labs are satisfactory. Stay on same medicines.

## 2010-11-04 NOTE — Progress Notes (Signed)
Agree with note.  [name] [name]  

## 2010-11-21 ENCOUNTER — Encounter: Payer: Self-pay | Admitting: Nurse Practitioner

## 2010-11-21 ENCOUNTER — Ambulatory Visit (INDEPENDENT_AMBULATORY_CARE_PROVIDER_SITE_OTHER): Payer: 59 | Admitting: Nurse Practitioner

## 2010-11-21 VITALS — BP 142/102 | HR 70 | Ht 70.0 in | Wt 254.0 lb

## 2010-11-21 DIAGNOSIS — E785 Hyperlipidemia, unspecified: Secondary | ICD-10-CM

## 2010-11-21 DIAGNOSIS — I10 Essential (primary) hypertension: Secondary | ICD-10-CM

## 2010-11-21 DIAGNOSIS — I2510 Atherosclerotic heart disease of native coronary artery without angina pectoris: Secondary | ICD-10-CM

## 2010-11-21 MED ORDER — NITROGLYCERIN 0.4 MG SL SUBL: 0.4000 mg | SUBLINGUAL_TABLET | SUBLINGUAL | Status: DC | PRN

## 2010-11-21 MED ORDER — PRAVASTATIN SODIUM 40 MG PO TABS: 40.0000 mg | ORAL_TABLET | Freq: Every evening | ORAL | Status: DC

## 2010-11-21 MED ORDER — AMLODIPINE BESYLATE 5 MG PO TABS: 5.0000 mg | ORAL_TABLET | Freq: Every day | ORAL | Status: DC

## 2010-11-21 NOTE — Patient Instructions (Signed)
We are going to add Norvasc (amlodipine) at 5 mg daily Stop your Zocor. We are going to use Pravachol 40 mg daily for your cholesterol Stay on your other medicines We will see you in month.  We will check your labs on your next visit that needs to be fasting.  Call for any problems

## 2010-11-21 NOTE — Progress Notes (Signed)
    [name] [name] Date of Birth: [birth year]   History of Present Illness: [name] [name] is seen back today for a 3 week check. He is seen for Dr. Shirlee Latch. He is a former patient of Dr. Ronnald Nian. We are putting him back on his blood pressure medicines. He feels good and really has no complaint. Blood pressure is coming down but still not at goal. No headaches or dizziness. He will have some fleeting chest pain, nothing exertional. He remains active with his [name] fighting.   Current Outpatient Prescriptions on File Prior to Visit  Medication Sig Dispense Refill  . aspirin 325 MG tablet Take 325 mg by mouth daily.        . hydrochlorothiazide 25 MG tablet Take 1 tablet (25 mg total) by mouth daily.  30 tablet  6  . lisinopril (PRINIVIL,ZESTRIL) 20 MG tablet Take 1 tablet (20 mg total) by mouth daily.  30 tablet  11  . metoprolol tartrate (LOPRESSOR) 25 MG tablet Take 1 tablet (25 mg total) by mouth 2 (two) times daily.  60 tablet  11  . amLODipine (NORVASC) 5 MG tablet Take 1 tablet (5 mg total) by mouth daily.  30 tablet  11    No Known Allergies  Past Medical History  Diagnosis Date  . Hyperlipidemia   . Hypertension   . Coronary artery disease     Prior inferior MI in 2005 with stent to RCA that subsequently occluded. Last cath in 2009 with stent to to LAD. EF 45 to 50%  . Obesity   . 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 Stress   . DJD (degenerative joint disease)   . Sleep apnea   . Anxiety   . Depression     Past Surgical History  Procedure Date  . Cardiac catheterization 10/09/2007    EF 45-50%  . Coronary stent placement     RIGHT CORONARY THAT SUBSEQUENTLY OCCLUDED.  [name] Coronary stent placement     LAD WITH A 3.0X15MM PROMUS DRUG-ELUTING STENT PLACED.  [name] Knee arthroscopy   . US echocardiography 03/14/2003    EF 55-60%  . Cardiovascular stress test 11/23/2008    EF 55%    History  Smoking status  . Never Smoker   Smokeless tobacco  . Not on file    History  Alcohol  Use No    Family History  Problem Relation Age of Onset  . Hypertension Mother     Review of Systems: The review of systems is per the HPI.  All other systems were reviewed and are negative.  Physical Exam: BP 142/102  Pulse 70  Ht 5\' 10"  (1.778 m)  Wt 254 lb (115.214 kg)  BMI 36.45 kg/m2 Patient is very pleasant and in no acute distress. He is overweight. Skin is warm and dry. Color is normal.  HEENT is unremarkable. Normocephalic/atraumatic. PERRL. Sclera are nonicteric. Neck is supple. No masses. No JVD. Lungs are clear. Cardiac exam shows a regular rate and rhythm. Abdomen is obese but soft. Extremities are without edema. Gait and ROM are intact. No gross neurologic deficits noted.   LABORATORY DATA: N/A   Assessment / Plan:

## 2010-11-21 NOTE — Assessment & Plan Note (Signed)
I have added Norvasc 5 mg daily. Will see again in one month.

## 2010-11-21 NOTE — Assessment & Plan Note (Signed)
Zocor will be switched over to Pravachol due to the addition of Norvasc. We will recheck his labs on return.

## 2010-11-21 NOTE — Assessment & Plan Note (Signed)
His last stress test was 2 years ago. We will plan on repeating when we have a little better blood pressure control. No exertional symptoms reported.

## 2010-11-25 NOTE — Progress Notes (Signed)
Agree with note.  Michaela Shankel  

## 2010-12-06 LAB — BASIC METABOLIC PANEL
BUN: 17
CO2: 30
Calcium: 9.1
Chloride: 103
Creatinine, Ser: 1.42
GFR calc Af Amer: >60
GFR calc non Af Amer: 53 — ABNORMAL LOW
Glucose, Bld: 105 — ABNORMAL HIGH
Potassium: 4.2
Sodium: 138

## 2010-12-06 LAB — CARDIAC PANEL(CRET KIN+CKTOT+MB+TROPI)
CK, MB: 1.7
Relative Index: 1.5
Total CK: 116
Troponin I: 0.03

## 2010-12-06 LAB — CBC
HCT: 44.8
Hemoglobin: 15.6
MCHC: 34.9
MCV: 89.9
Platelets: 209
RBC: 4.98
RDW: 13.2
WBC: 7.9

## 2010-12-21 ENCOUNTER — Other Ambulatory Visit: Payer: 59 | Admitting: *Deleted

## 2010-12-21 ENCOUNTER — Ambulatory Visit: Payer: 59 | Admitting: Nurse Practitioner

## 2011-01-04 ENCOUNTER — Telehealth: Payer: Self-pay | Admitting: Cardiology

## 2011-01-04 MED ORDER — PRAVASTATIN SODIUM 40 MG PO TABS: 40.0000 mg | ORAL_TABLET | Freq: Every evening | ORAL | Status: DC

## 2011-01-04 MED ORDER — AMLODIPINE BESYLATE 5 MG PO TABS: 5.0000 mg | ORAL_TABLET | Freq: Every day | ORAL | Status: DC

## 2011-01-04 MED ORDER — HYDROCHLOROTHIAZIDE 25 MG PO TABS: 25.0000 mg | ORAL_TABLET | Freq: Every day | ORAL | Status: DC

## 2011-01-04 NOTE — Telephone Encounter (Signed)
New Problem:  Called needig a refill of hydrochlorothiazide 25 MG tablet, amLODipine (NORVASC) 5 MG tablet, amLODipine (NORVASC) 5 MG tablet, pravastatin (PRAVACHOL) 40 MG tablet.

## 2011-01-07 ENCOUNTER — Other Ambulatory Visit: Payer: 59 | Admitting: *Deleted

## 2011-01-07 ENCOUNTER — Ambulatory Visit: Payer: 59 | Admitting: Nurse Practitioner

## 2011-01-21 ENCOUNTER — Ambulatory Visit (INDEPENDENT_AMBULATORY_CARE_PROVIDER_SITE_OTHER): Payer: 59 | Admitting: Nurse Practitioner

## 2011-01-21 ENCOUNTER — Encounter: Payer: Self-pay | Admitting: Nurse Practitioner

## 2011-01-21 ENCOUNTER — Other Ambulatory Visit: Payer: 59 | Admitting: *Deleted

## 2011-01-21 VITALS — BP 120/100 | HR 74 | Ht 70.0 in | Wt 251.8 lb

## 2011-01-21 DIAGNOSIS — I10 Essential (primary) hypertension: Secondary | ICD-10-CM

## 2011-01-21 DIAGNOSIS — I2510 Atherosclerotic heart disease of native coronary artery without angina pectoris: Secondary | ICD-10-CM

## 2011-01-21 DIAGNOSIS — E785 Hyperlipidemia, unspecified: Secondary | ICD-10-CM

## 2011-01-21 DIAGNOSIS — F439 Reaction to severe stress, unspecified: Secondary | ICD-10-CM

## 2011-01-21 DIAGNOSIS — Z733 Stress, not elsewhere classified: Secondary | ICD-10-CM

## 2011-01-21 MED ORDER — LISINOPRIL 40 MG PO TABS: 40.0000 mg | ORAL_TABLET | Freq: Every day | ORAL | Status: DC

## 2011-01-21 NOTE — Assessment & Plan Note (Signed)
He is not fasting today. We will recheck his labs this Friday.

## 2011-01-21 NOTE — Assessment & Plan Note (Signed)
No symptoms at the present time. Need to consider repeat stress testing on return.

## 2011-01-21 NOTE — Assessment & Plan Note (Signed)
Blood pressure is a little better by me today. Down to 120/100. Still not at goal. I have increased his Lisinopril to 40 mg. I will have him see Dr. Shirlee Latch as a new patient in 6 weeks. Patient is agreeable to this plan and will call if any problems develop in the interim.

## 2011-01-21 NOTE — Assessment & Plan Note (Signed)
Has issues with stress at work and at home. Has had 2 sons with substance abuse.

## 2011-01-21 NOTE — Patient Instructions (Signed)
Increase the Lisinopril to 40 mg daily. You can use two of your 20 mg tablets to equal this dose. I have sent a new prescription to [facility] for the 40 mg  Check labs on Friday.  I will have you see Dr. Marca Ancona in 6 weeks.   Call the [facility] office at [phone] if you have any questions, problems or concerns.

## 2011-01-21 NOTE — Progress Notes (Signed)
[name] [name] Date of Birth: Aug 14, 1960 Medical Record #[id number]  History of Present Illness: [name] [name] is seen back today for a one month check. He is seen for Dr. Shirlee [name]. He is a former patient of Dr. Ronnald [name]. He has known CAD, HTN and hyperlipidemia. We have gotten him back on his blood pressure medicines. He is doing ok. Feels ok. No chest pain. Remains active with his [name] fighting. Tolerating his medicines ok.   Current Outpatient Prescriptions on File Prior to Visit  Medication Sig Dispense Refill  . amLODipine (NORVASC) 5 MG tablet Take 1 tablet (5 mg total) by mouth daily.  30 tablet  2  . aspirin 325 MG tablet Take 325 mg by mouth daily.        . hydrochlorothiazide (HYDRODIURIL) 25 MG tablet Take 1 tablet (25 mg total) by mouth daily.  30 tablet  2  . metoprolol tartrate (LOPRESSOR) 25 MG tablet Take 1 tablet (25 mg total) by mouth 2 (two) times daily.  60 tablet  11  . nitroGLYCERIN (NITROSTAT) 0.4 MG SL tablet Place 1 tablet (0.4 mg total) under the tongue every 5 (five) minutes as needed for chest pain.  25 tablet  3  . pravastatin (PRAVACHOL) 40 MG tablet Take 1 tablet (40 mg total) by mouth every evening.  30 tablet  2  . DISCONTD: lisinopril (PRINIVIL,ZESTRIL) 20 MG tablet Take 1 tablet (20 mg total) by mouth daily.  30 tablet  11  . DISCONTD: lisinopril (PRINIVIL,ZESTRIL) 10 MG tablet Take 1 tablet (10 mg total) by mouth daily.  30 tablet  11    No Known Allergies  Past Medical History  Diagnosis Date  . Hyperlipidemia   . Hypertension   . Coronary artery disease     Prior inferior MI in 2005 with stent to RCA that subsequently occluded. Has left to right collaterals. Last cath in 2009 with stent to to LAD. EF 45 to 50%  . Obesity   . 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 Stress   . DJD (degenerative joint disease)   . Sleep apnea   . Anxiety   . Depression     Past Surgical History  Procedure Date  . Cardiac catheterization 10/09/2007    EF 45-50%  .  Coronary stent placement     RIGHT CORONARY THAT SUBSEQUENTLY OCCLUDED.  [name] Coronary stent placement     LAD WITH A 3.0X15MM PROMUS DRUG-ELUTING STENT PLACED.  [name] Knee arthroscopy   . US echocardiography 03/14/2003    EF 55-60%  . Cardiovascular stress test 11/23/2008    EF 55%    History  Smoking status  . Never Smoker   Smokeless tobacco  . Not on file    History  Alcohol Use No    Family History  Problem Relation Age of Onset  . Hypertension Mother     Review of Systems: The review of systems is per the HPI. Weight is down 3 pounds.  He is not fasting today. All other systems were reviewed and are negative.  Physical Exam: BP 120/100  Pulse 74  Ht 5\' 10"  (1.778 m)  Wt 251 lb 12.8 oz (114.216 kg)  BMI 36.13 kg/m2 Patient is very pleasant and in no acute distress. Skin is warm and dry. Color is normal.  HEENT is unremarkable. Normocephalic/atraumatic. PERRL. Sclera are nonicteric. Neck is supple. No masses. No JVD. Lungs are clear. Cardiac exam shows a regular rate and rhythm. Abdomen is obese but soft.  Extremities are without edema. Gait and ROM are intact. No gross neurologic deficits noted.   LABORATORY DATA:   Assessment / Plan:

## 2011-01-25 ENCOUNTER — Other Ambulatory Visit (INDEPENDENT_AMBULATORY_CARE_PROVIDER_SITE_OTHER): Payer: 59 | Admitting: *Deleted

## 2011-01-25 DIAGNOSIS — E785 Hyperlipidemia, unspecified: Secondary | ICD-10-CM

## 2011-01-25 LAB — HEPATIC FUNCTION PANEL
ALT: 38 U/L (ref 0–53)
AST: 27 U/L (ref 0–37)
Albumin: 4.4 g/dL (ref 3.5–5.2)
Alkaline Phosphatase: 82 U/L (ref 39–117)
Bilirubin, Direct: 0 mg/dL (ref 0.0–0.3)
Total Bilirubin: 0.9 mg/dL (ref 0.3–1.2)
Total Protein: 7.7 g/dL (ref 6.0–8.3)

## 2011-01-25 LAB — BASIC METABOLIC PANEL
BUN: 14 mg/dL (ref 6–23)
CO2: 27 mEq/L (ref 19–32)
Calcium: 9 mg/dL (ref 8.4–10.5)
Chloride: 105 mEq/L (ref 96–112)
Creatinine, Ser: 1.1 mg/dL (ref 0.4–1.5)
GFR: 74.73 mL/min (ref >60.00)
Glucose, Bld: 105 mg/dL — ABNORMAL HIGH (ref 70–99)
Potassium: 3.9 mEq/L (ref 3.5–5.1)
Sodium: 139 mEq/L (ref 135–145)

## 2011-01-25 LAB — LIPID PANEL
Cholesterol: 185 mg/dL (ref 0–200)
HDL: 46.8 mg/dL (ref >39.00)
LDL Cholesterol: 101 mg/dL — ABNORMAL HIGH (ref 0–99)
Total CHOL/HDL Ratio: 4
Triglycerides: 187 mg/dL — ABNORMAL HIGH (ref 0.0–149.0)
VLDL: 37.4 mg/dL (ref 0.0–40.0)

## 2011-01-27 NOTE — Progress Notes (Signed)
Agree with note. 

## 2011-03-04 ENCOUNTER — Ambulatory Visit: Payer: 59 | Admitting: Cardiology

## 2011-03-19 ENCOUNTER — Ambulatory Visit (INDEPENDENT_AMBULATORY_CARE_PROVIDER_SITE_OTHER): Payer: 59 | Admitting: Cardiology

## 2011-03-19 ENCOUNTER — Encounter: Payer: Self-pay | Admitting: Cardiology

## 2011-03-19 DIAGNOSIS — E785 Hyperlipidemia, unspecified: Secondary | ICD-10-CM

## 2011-03-19 DIAGNOSIS — I10 Essential (primary) hypertension: Secondary | ICD-10-CM

## 2011-03-19 DIAGNOSIS — E7800 Pure hypercholesterolemia, unspecified: Secondary | ICD-10-CM

## 2011-03-19 DIAGNOSIS — I2510 Atherosclerotic heart disease of native coronary artery without angina pectoris: Secondary | ICD-10-CM

## 2011-03-19 MED ORDER — PRAVASTATIN SODIUM 80 MG PO TABS: 80.0000 mg | ORAL_TABLET | Freq: Every evening | ORAL | Status: DC

## 2011-03-19 NOTE — Progress Notes (Signed)
PCP: Dr. Collins Scotland  51 yo with history of CAD s/p inferior MI in 2005.  Now with chronically occluded RCA and L=>R collaterals.  He had Promus DES to the proximal LAD in 8/09 due to unstable angina.  Since then, he has done well symptomatically.  No chest pain, no exertional dyspnea.  He does [place] fighting twice a week.  This is fairly strenuous.  He has had a fair amount of stress at home with addiction issues in his children.  BP initially high today but recheck was 130/87, which is around what it runs at home.  He snores but denies daytime sleepiness.  At some point, he was told he has OSA but has never had a sleep study that he can remember.    ECG: NSR, old inferior MI  Labs (12/12): LDL 101, HDL 47, K 3.9, creatinine 1.1  PMH: 1. CAD: Inferior MI in 2005 with RCA stent.  This stent subsequently occluded and he was found to have L => R collaterals.[name]  Unstable angina developed in 8/09.  LHC showed a 95% proximal LAD stenosis.  He had a Promus DES to the proximal LAD.  LV-gram showed EF 45-50%.  2. HTN 3. Hyperlipidemia 4. Obesity 5. OA 6. OSA: not on CPAP 7. Depression  SH: Married, 3 sons, lives in [place].  Emergency planning/management officer for [employer], former Patent attorney, now does Nurse, adult for exercise.   FH: CAD  ROS: All systems reviewed and negative except as per HPI.   Current Outpatient Prescriptions  Medication Sig Dispense Refill  . amLODipine (NORVASC) 5 MG tablet Take 1 tablet (5 mg total) by mouth daily.  30 tablet  2  . hydrochlorothiazide (HYDRODIURIL) 25 MG tablet Take 1 tablet (25 mg total) by mouth daily.  30 tablet  2  . lisinopril (PRINIVIL,ZESTRIL) 40 MG tablet Take 1 tablet (40 mg total) by mouth daily.  30 tablet  11  . metoprolol tartrate (LOPRESSOR) 25 MG tablet Take 1 tablet (25 mg total) by mouth 2 (two) times daily.  60 tablet  11  . DISCONTD: aspirin 325 MG tablet Take 325 mg by mouth daily.        [name] DISCONTD: pravastatin (PRAVACHOL) 40  MG tablet Take 1 tablet (40 mg total) by mouth every evening.  30 tablet  2  . aspirin EC 81 MG tablet Take 1 tablet (81 mg total) by mouth daily.      . nitroGLYCERIN (NITROSTAT) 0.4 MG SL tablet Place 1 tablet (0.4 mg total) under the tongue every 5 (five) minutes as needed for chest pain.  25 tablet  3  . pravastatin (PRAVACHOL) 80 MG tablet Take 1 tablet (80 mg total) by mouth every evening.  30 tablet  6  . DISCONTD: lisinopril (PRINIVIL,ZESTRIL) 10 MG tablet Take 1 tablet (10 mg total) by mouth daily.  30 tablet  11  . DISCONTD: lisinopril (PRINIVIL,ZESTRIL) 20 MG tablet Take 1 tablet (20 mg total) by mouth daily.  30 tablet  11    BP 130/87  Pulse 63  Ht 5\' 10"  (1.778 m)  Wt 115.667 kg (255 lb)  BMI 36.59 kg/m2 General: NAD, obese Neck: Thick, no JVD, no thyromegaly or thyroid nodule.  Lungs: Clear to auscultation bilaterally with normal respiratory effort. CV: Nondisplaced PMI.  Heart regular S1/S2, no S3/S4, no murmur.  No peripheral edema.  No carotid bruit.  Normal pedal pulses.  Abdomen: Soft, nontender, no hepatosplenomegaly, no distention.  Neurologic: Alert and oriented x 3.  Psych: Normal affect. Extremities: No clubbing or cyanosis.

## 2011-03-19 NOTE — Assessment & Plan Note (Addendum)
No ischemic symptoms.  Continue ASA (can decrease to 81 mg daily), statin, ACEI, beta blocker.  EF 45-50% on LV-gram in 2009.  I will get an echo to reassess LV EF.

## 2011-03-19 NOTE — Assessment & Plan Note (Signed)
BP initially high but normal with recheck.  He will check his BP daily and call us with readings in 2 wks.

## 2011-03-19 NOTE — Assessment & Plan Note (Signed)
Goal LDL < 70.  He was above goal in 12/12.  Will increase pravastatin to 80 mg daily with lipids/LFTs in 2 months.

## 2011-03-19 NOTE — Patient Instructions (Addendum)
Decrease aspirin to 81mg  daily.   Increase pravachol to 80mg  daily in the evening.  Your physician has requested that you have an echocardiogram. Echocardiography is a painless test that uses sound waves to create images of your heart. It provides your doctor with information about the size and shape of your heart and how well your heart's chambers and valves are working. This procedure takes approximately one hour. There are no restrictions for this procedure.  Your physician recommends that you return for a FASTING lipid profile /liver profile 414.01  272.0  401.9   Take and record your blood pressure every other day. Call me in 2-3 weeks and give me the readings. Luana Shu [phone]  Your physician wants you to follow-up in: 6 months with Dr Shirlee Latch. (July 2013). You will receive a reminder letter in the mail two months in advance. If you don't receive a letter, please call our office to schedule the follow-up appointment.

## 2011-04-01 ENCOUNTER — Ambulatory Visit (HOSPITAL_COMMUNITY): Payer: 59 | Admitting: Radiology

## 2011-05-20 ENCOUNTER — Other Ambulatory Visit: Payer: 59

## 2011-06-16 ENCOUNTER — Other Ambulatory Visit: Payer: Self-pay | Admitting: Nurse Practitioner

## 2011-06-17 ENCOUNTER — Other Ambulatory Visit: Payer: Self-pay

## 2011-06-17 MED ORDER — AMLODIPINE BESYLATE 5 MG PO TABS: 5.0000 mg | ORAL_TABLET | Freq: Every day | ORAL | Status: DC

## 2011-06-17 MED ORDER — HYDROCHLOROTHIAZIDE 25 MG PO TABS: 25.0000 mg | ORAL_TABLET | Freq: Every day | ORAL | Status: DC

## 2011-11-05 ENCOUNTER — Telehealth: Payer: Self-pay | Admitting: Cardiology

## 2011-11-05 NOTE — Telephone Encounter (Signed)
New Problem:     I called the patient and was unable to reach them. I left a message on their voicemail with my name, the reason I called, the name of his physician, and a number to call back to schedule their appointment. 

## 2012-05-13 ENCOUNTER — Other Ambulatory Visit: Payer: Self-pay | Admitting: Nurse Practitioner

## 2012-05-13 ENCOUNTER — Other Ambulatory Visit: Payer: Self-pay | Admitting: Cardiology

## 2013-11-25 ENCOUNTER — Encounter: Payer: Self-pay | Admitting: *Deleted

## 2013-12-01 ENCOUNTER — Ambulatory Visit (INDEPENDENT_AMBULATORY_CARE_PROVIDER_SITE_OTHER): Payer: BC Managed Care – PPO | Admitting: Cardiology

## 2013-12-01 ENCOUNTER — Encounter: Payer: Self-pay | Admitting: *Deleted

## 2013-12-01 ENCOUNTER — Encounter: Payer: Self-pay | Admitting: Cardiology

## 2013-12-01 VITALS — BP 160/100 | HR 93 | Ht 70.0 in | Wt 261.0 lb

## 2013-12-01 DIAGNOSIS — E785 Hyperlipidemia, unspecified: Secondary | ICD-10-CM

## 2013-12-01 DIAGNOSIS — I2510 Atherosclerotic heart disease of native coronary artery without angina pectoris: Secondary | ICD-10-CM

## 2013-12-01 DIAGNOSIS — I255 Ischemic cardiomyopathy: Secondary | ICD-10-CM

## 2013-12-01 DIAGNOSIS — I10 Essential (primary) hypertension: Secondary | ICD-10-CM

## 2013-12-01 DIAGNOSIS — R0683 Snoring: Secondary | ICD-10-CM

## 2013-12-01 MED ORDER — METOPROLOL TARTRATE 25 MG PO TABS: 25.0000 mg | ORAL_TABLET | Freq: Two times a day (BID) | ORAL | Status: DC

## 2013-12-01 MED ORDER — AMLODIPINE BESYLATE 5 MG PO TABS: 5.0000 mg | ORAL_TABLET | Freq: Every day | ORAL | Status: DC

## 2013-12-01 MED ORDER — PRAVASTATIN SODIUM 80 MG PO TABS: 80.0000 mg | ORAL_TABLET | Freq: Every day | ORAL | Status: DC

## 2013-12-01 MED ORDER — LISINOPRIL 20 MG PO TABS: 20.0000 mg | ORAL_TABLET | Freq: Every day | ORAL | Status: DC

## 2013-12-01 NOTE — Patient Instructions (Addendum)
Take pravastatin 80mg  daily in the evening.  Take amlodipine 5mg  daily.   Take metoprolol 25mg  two times a day.  Take lisinopril 20mg  daily.  Take aspirin 81mg  daily.   Your physician has requested that you have an echocardiogram. Echocardiography is a painless test that uses sound waves to create images of your heart. It provides your doctor with information about the size and shape of your heart and how well your heart's chambers and valves are working. This procedure takes approximately one hour. There are no restrictions for this procedure.  Your physician recommends that you return for lab work in: about 2 weeks--BMET/CBCd/Liver profile.  Your physician recommends that you return for a FASTING lipid profile /liver profile in about 2 months.     Your physician has recommended that you have a sleep study. This test records several body functions during sleep, including: brain activity, eye movement, oxygen and carbon dioxide blood levels, heart rate and rhythm, breathing rate and rhythm, the flow of air through your mouth and nose, snoring, body muscle movements, and chest and belly movement.  Your physician recommends that you schedule a follow-up appointment with Kathrene Alu in 2 weeks.

## 2013-12-02 NOTE — Progress Notes (Signed)
Patient ID: [name] [name], male   DOB: 1960-09-27, 53 y.o.   MRN: [id number] PCP: Dr. Modena Morrow  53 yo with history of CAD s/p inferior MI in 2005.  Now with chronically occluded RCA and L=>R collaterals.  He had Promus DES to the proximal LAD in 8/09 due to unstable angina.  I have not sen him for a couple of years.  He lost his job and insurance but now has a new job.  He has been off all his meds x 1 year.  BP has been running high.    Despite being off his meds, he has minimal symptoms.  He can walk up steps without problems.  No exertional dyspnea.  No chest pain.  No lightheadedness or palpitations.  He snores loudly but has not had a sleep study.    ECG: NSR, old inferior MI  Labs (12/12): LDL 101, HDL 47, K 3.9, creatinine 1.1  PMH: 1. CAD: Inferior MI in 2005 with RCA stent.  This stent subsequently occluded and he was found to have L => R collaterals.[name] [name]  Unstable angina developed in 8/09.  LHC showed a 95% proximal LAD stenosis.  He had a Promus DES to the proximal LAD.  LV-gram showed EF 45-50%.  2. HTN 3. Hyperlipidemia 4. Obesity 5. OA 6. OSA: not on CPAP 7. Depression  SH: Married, 3 sons, lives in [place].  Government social research officer for [employer], former Hospital doctor, now does Database administrator for exercise.   FH: CAD  ROS: All systems reviewed and negative except as per HPI.   Current Outpatient Prescriptions  Medication Sig Dispense Refill  . amLODipine (NORVASC) 5 MG tablet Take 1 tablet (5 mg total) by mouth daily.  30 tablet  2  . aspirin EC 81 MG tablet Take 1 tablet (81 mg total) by mouth daily.      [name] [name] lisinopril (PRINIVIL,ZESTRIL) 20 MG tablet Take 1 tablet (20 mg total) by mouth daily.  30 tablet  2  . metoprolol tartrate (LOPRESSOR) 25 MG tablet Take 1 tablet (25 mg total) by mouth 2 (two) times daily.  60 tablet  2  . nitroGLYCERIN (NITROSTAT) 0.4 MG SL tablet Place 1 tablet (0.4 mg total) under the tongue every 5 (five) minutes as needed for chest  pain.  25 tablet  3  . pravastatin (PRAVACHOL) 80 MG tablet Take 1 tablet (80 mg total) by mouth daily.  30 tablet  2   No current facility-administered medications for this visit.    BP 160/100  Pulse 93  Ht 5\' 10"  (1.778 m)  Wt 261 lb (118.389 kg)  BMI 37.45 kg/m2 General: NAD, obese Neck: Thick, no JVD, no thyromegaly or thyroid nodule.  Lungs: Clear to auscultation bilaterally with normal respiratory effort. CV: Nondisplaced PMI.  Heart regular S1/S2, no S3/S4, no murmur.  No peripheral edema.  No carotid bruit.  Normal pedal pulses.  Abdomen: Soft, nontender, no hepatosplenomegaly, no distention.  Neurologic: Alert and oriented x 3.  Psych: Normal affect. Extremities: No clubbing or cyanosis.   Assessment/Plan: 1. HTN: BP is running quite high.  Today, I will have him restart amlodipine 5 mg daily, lisinopril 20 mg daily, and metoprolol 25 mg bid.  He will need BMET in two weeks. 2. Hyperlipidemia: Restart pravastatin and check lipids/LFTs in 2 months.  3. OSA: I strongly suspect OSA.  This potentiates HTN.  I will get sleep study.  4. CAD: No ischemic symptoms.  Restart ASA 81, statin, metoprolol, lisinopril.  5. Ischemic cardiomyopathy: EF 45-50% on LV-gram with last cath.  I will get an echo to reassess EF.   Followup in 2 wks with Cecille Rubin and 3 wks with me.    [name] [name] 12/02/2013

## 2013-12-16 ENCOUNTER — Ambulatory Visit (HOSPITAL_COMMUNITY): Payer: BC Managed Care – PPO | Attending: Cardiology

## 2013-12-16 ENCOUNTER — Other Ambulatory Visit (INDEPENDENT_AMBULATORY_CARE_PROVIDER_SITE_OTHER): Payer: BC Managed Care – PPO | Admitting: *Deleted

## 2013-12-16 DIAGNOSIS — R0683 Snoring: Secondary | ICD-10-CM

## 2013-12-16 DIAGNOSIS — I255 Ischemic cardiomyopathy: Secondary | ICD-10-CM | POA: Diagnosis not present

## 2013-12-16 LAB — HEPATIC FUNCTION PANEL
ALK PHOS: 86 U/L (ref 39–117)
ALT: 34 U/L (ref 0–53)
AST: 28 U/L (ref 0–37)
Albumin: 3.9 g/dL (ref 3.5–5.2)
BILIRUBIN DIRECT: 0 mg/dL (ref 0.0–0.3)
Total Bilirubin: 0.9 mg/dL (ref 0.2–1.2)
Total Protein: 7.9 g/dL (ref 6.0–8.3)

## 2013-12-16 LAB — CBC WITH DIFFERENTIAL/PLATELET
BASOS ABS: 0 10*3/uL (ref 0.0–0.1)
Basophils Relative: 0.4 % (ref 0.0–3.0)
Eosinophils Absolute: 0.2 10*3/uL (ref 0.0–0.7)
Eosinophils Relative: 1.9 % (ref 0.0–5.0)
HCT: 50.2 % (ref 39.0–52.0)
Hemoglobin: 17 g/dL (ref 13.0–17.0)
LYMPHS PCT: 23.7 % (ref 12.0–46.0)
Lymphs Abs: 2.1 10*3/uL (ref 0.7–4.0)
MCHC: 33.8 g/dL (ref 30.0–36.0)
MCV: 92.3 fl (ref 78.0–100.0)
Monocytes Absolute: 1 10*3/uL (ref 0.1–1.0)
Monocytes Relative: 11.7 % (ref 3.0–12.0)
Neutro Abs: 5.5 10*3/uL (ref 1.4–7.7)
Neutrophils Relative %: 62.3 % (ref 43.0–77.0)
Platelets: 206 10*3/uL (ref 150.0–400.0)
RBC: 5.44 Mil/uL (ref 4.22–5.81)
RDW: 13.2 % (ref 11.5–15.5)
WBC: 8.8 10*3/uL (ref 4.0–10.5)

## 2013-12-16 LAB — BASIC METABOLIC PANEL
BUN: 14 mg/dL (ref 6–23)
CALCIUM: 9.4 mg/dL (ref 8.4–10.5)
CO2: 29 mEq/L (ref 19–32)
Chloride: 101 mEq/L (ref 96–112)
Creatinine, Ser: 1.2 mg/dL (ref 0.4–1.5)
GFR: 70.22 mL/min (ref >60.00)
GLUCOSE: 116 mg/dL — AB (ref 70–99)
Potassium: 4.3 mEq/L (ref 3.5–5.1)
Sodium: 135 mEq/L (ref 135–145)

## 2013-12-16 NOTE — Progress Notes (Signed)
2D Echo completed. 12/16/2013  

## 2013-12-22 ENCOUNTER — Ambulatory Visit (INDEPENDENT_AMBULATORY_CARE_PROVIDER_SITE_OTHER): Payer: BC Managed Care – PPO | Admitting: Nurse Practitioner

## 2013-12-22 ENCOUNTER — Encounter: Payer: Self-pay | Admitting: Nurse Practitioner

## 2013-12-22 VITALS — BP 140/100 | HR 67 | Ht 70.5 in | Wt 258.0 lb

## 2013-12-22 DIAGNOSIS — I10 Essential (primary) hypertension: Secondary | ICD-10-CM

## 2013-12-22 DIAGNOSIS — I255 Ischemic cardiomyopathy: Secondary | ICD-10-CM

## 2013-12-22 DIAGNOSIS — E785 Hyperlipidemia, unspecified: Secondary | ICD-10-CM

## 2013-12-22 LAB — BASIC METABOLIC PANEL
BUN: 10 mg/dL (ref 6–23)
CO2: 29 mEq/L (ref 19–32)
Calcium: 9.2 mg/dL (ref 8.4–10.5)
Chloride: 102 mEq/L (ref 96–112)
Creatinine, Ser: 1.1 mg/dL (ref 0.4–1.5)
GFR: 70.92 mL/min (ref >60.00)
Glucose, Bld: 121 mg/dL — ABNORMAL HIGH (ref 70–99)
Potassium: 3.9 mEq/L (ref 3.5–5.1)
Sodium: 136 mEq/L (ref 135–145)

## 2013-12-22 MED ORDER — HYDROCHLOROTHIAZIDE 25 MG PO TABS: 25.0000 mg | ORAL_TABLET | Freq: Every day | ORAL | Status: DC

## 2013-12-22 NOTE — Progress Notes (Signed)
[name] [name] Date of Birth: 07/21/1960 Medical Record #[id number]  History of Present Illness: [name] [name] is seen back today for a 2 week check. Seen for Dr. Aundra Dubin. Former patient of Dr. Susa Simmonds. He has a history of CAD s/p inferior MI in 2005. Now with chronically occluded RCA and L=>R collaterals. He had Promus DES to the proximal LAD in 8/09 due to unstable angina. He has had noncompliance with medicines due to losing his job and insurance but now has a new job.  Seen here about 2 weeks ago. He had been off all his meds x 1 year. BP was been running high. Despite being off his meds, he had minimal symptoms. His medicines were restarted. Echo updated.   Comes back today. Here alone. Doing well. No complaints. He says he had soreness over his left lateral chest after the echo that he could not lay down for 3 days. No actual bruising noted. Has resolved now. Feels good. No chest pain. Not short of breath. Energy level ok.   Current Outpatient Prescriptions  Medication Sig Dispense Refill  . amLODipine (NORVASC) 5 MG tablet Take 1 tablet (5 mg total) by mouth daily. 30 tablet 2  . aspirin EC 81 MG tablet Take 1 tablet (81 mg total) by mouth daily.    [name] [name] lisinopril (PRINIVIL,ZESTRIL) 20 MG tablet Take 1 tablet (20 mg total) by mouth daily. 30 tablet 2  . metoprolol tartrate (LOPRESSOR) 25 MG tablet Take 1 tablet (25 mg total) by mouth 2 (two) times daily. 60 tablet 2  . pravastatin (PRAVACHOL) 80 MG tablet Take 1 tablet (80 mg total) by mouth daily. 30 tablet 2  . nitroGLYCERIN (NITROSTAT) 0.4 MG SL tablet Place 1 tablet (0.4 mg total) under the tongue every 5 (five) minutes as needed for chest pain. 25 tablet 3   No current facility-administered medications for this visit.    No Known Allergies   PMH: 1. CAD: Inferior MI in 2005 with RCA stent. This stent subsequently occluded and he was found to have L => R collaterals.[name] [name] Unstable angina developed in 8/09. LHC showed a 95%  proximal LAD stenosis. He had a Promus DES to the proximal LAD. LV-gram showed EF 45-50%.  2. HTN 3. Hyperlipidemia 4. Obesity 5. OA 6. OSA: not on CPAP 7. Depression   Past Medical History  Diagnosis Date  . Hyperlipidemia   . Hypertension   . Coronary artery disease     Prior inferior MI in 2005 with stent to RCA that subsequently occluded. Has left to right collaterals. Last cath in 2009 with stent to to LAD. EF 45 to 50%  . Obesity   . 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 Stress   . DJD (degenerative joint disease)   . Sleep apnea   . Anxiety   . Depression     Past Surgical History  Procedure Laterality Date  . Cardiac catheterization  10/09/2007    EF 45-50%  . Coronary stent placement      RIGHT CORONARY THAT SUBSEQUENTLY OCCLUDED.  [name] [name] Coronary stent placement      LAD WITH A 3.0X15MM PROMUS DRUG-ELUTING STENT PLACED.  [name] [name] Knee arthroscopy    . US echocardiography  03/14/2003    EF 55-60%  . Cardiovascular stress test  11/23/2008    EF 55%    History  Smoking status  . Never Smoker   Smokeless tobacco  . Not on file    History  Alcohol Use No  Family History  Problem Relation Age of Onset  . Hypertension Mother   . Lung cancer Father     Review of Systems: The review of systems is per the HPI.  All other systems were reviewed and are negative.  Physical Exam: BP 140/100 mmHg  Pulse 67  Ht 5' 10.5" (1.791 m)  Wt 258 lb (117.028 kg)  BMI 36.48 kg/m2  SpO2 98% Patient is very pleasant and in no acute distress. Skin is warm and dry. Color is normal.  HEENT is unremarkable. Normocephalic/atraumatic. PERRL. Sclera are nonicteric. Neck is supple. No masses. No JVD. Lungs are clear. Cardiac exam shows a regular rate and rhythm. Abdomen is soft. Extremities are without edema. Gait and ROM are intact. No gross neurologic deficits noted.  Wt Readings from Last 3 Encounters:  12/22/13 258 lb (117.028 kg)  12/01/13 261 lb (118.389 kg)  03/19/11 255 lb  (115.667 kg)    LABORATORY DATA/PROCEDURES:  Lab Results  Component Value Date   WBC 8.8 12/16/2013   HGB 17.0 12/16/2013   HCT 50.2 12/16/2013   PLT 206.0 12/16/2013   GLUCOSE 116* 12/16/2013   CHOL 185 01/25/2011   TRIG 187.0* 01/25/2011   HDL 46.80 01/25/2011   LDLDIRECT 136.6 10/17/2010   LDLCALC 101* 01/25/2011   ALT 34 12/16/2013   AST 28 12/16/2013   NA 135 12/16/2013   K 4.3 12/16/2013   CL 101 12/16/2013   CREATININE 1.2 12/16/2013   BUN 14 12/16/2013   CO2 29 12/16/2013    BNP (last 3 results) No results for input(s): PROBNP in the last 8760 hours.  Echo Study Conclusions from October 2015  - Left ventricle: The cavity size was normal. Systolic function was normal. The estimated ejection fraction was in the range of 50% to 55%. Wall motion was normal; there were no regional wall motion abnormalities. Doppler parameters are consistent with abnormal left ventricular relaxation (grade 1 diastolic dysfunction). There was no evidence of elevated ventricular filling pressure by Doppler parameters. - Aortic valve: Trileaflet; normal thickness leaflets. There was no regurgitation. - Mitral valve: There was no regurgitation. - Right ventricle: Systolic function was normal. - Right atrium: The atrium was normal in size. - Tricuspid valve: There was no regurgitation. - Pulmonic valve: There was trivial regurgitation. - Pulmonary arteries: Systolic pressure couldn&'t be assessed. - Pericardium, extracardiac: There was no pericardial effusion.  Impressions:  - Normal biventricular size and systolic function. Abnormal relaxation with normal filling pressures. No significant valvular abnormality.  Assessment / Plan:  1. HTN: Adding HCTZ 25 mg a day to his current regimen. BMET today. See back in a month with his fasting labs.   2. Hyperlipidemia: back onpravastatin and for repeat fasting labs on return   3. OSA: I strongly suspect OSA. This  potentiates HTN.Sleep study has been ordered by Dr. Aundra Dubin -  For January 2016  4. CAD: No ischemic symptoms. Back on ASA 81, statin, metoprolol, lisinopril.   5. Ischemic cardiomyopathy: EF 45-50% on LV-gram with last cath. Most recent echo shows improvement in his EF - continue with medical therapy  Patient is agreeable to this plan and will call if any problems develop in the interim.   Burtis Junes, RN, [facility] [address] [place], [place]  [phone]

## 2013-12-22 NOTE — Patient Instructions (Addendum)
We will be checking the following labs today BMET  Stay on your current medicines but I am adding HCTZ 25 mg a day  I will see you in a month with fasting labs (we will change the 02/03/14 appointment)  Call the [place] office at [phone] if you have any questions, problems or concerns.

## 2014-01-19 ENCOUNTER — Encounter: Payer: Self-pay | Admitting: Nurse Practitioner

## 2014-01-19 ENCOUNTER — Ambulatory Visit (INDEPENDENT_AMBULATORY_CARE_PROVIDER_SITE_OTHER): Payer: BC Managed Care – PPO | Admitting: Nurse Practitioner

## 2014-01-19 VITALS — BP 130/90 | HR 85 | Ht 70.5 in | Wt 256.4 lb

## 2014-01-19 DIAGNOSIS — I255 Ischemic cardiomyopathy: Secondary | ICD-10-CM

## 2014-01-19 DIAGNOSIS — I2510 Atherosclerotic heart disease of native coronary artery without angina pectoris: Secondary | ICD-10-CM

## 2014-01-19 DIAGNOSIS — E785 Hyperlipidemia, unspecified: Secondary | ICD-10-CM

## 2014-01-19 DIAGNOSIS — I10 Essential (primary) hypertension: Secondary | ICD-10-CM

## 2014-01-19 NOTE — Progress Notes (Signed)
[name] [name] Date of Birth: Sep 12, 1960 Medical Record #[id number]  History of Present Illness: [name] [name] is seen back today for a 4 week check. Seen for Dr. Aundra Dubin. Former patient of Dr. Susa Simmonds. He has a history of CAD s/p inferior MI in 2005. Now with chronically occluded RCA and L=>R collaterals. He had Promus DES to the proximal LAD in 8/09 due to unstable angina. He has had noncompliance with medicines due to losing his job and insurance but now has a new job.  Seen here about 6 weeks ago. He had been off all his meds x 1 year. BP was been running high. Despite being off his meds, he had minimal symptoms. His medicines were restarted. Echo updated. EF is ok. I saw him a month ago and added HCTZ to his regimen for better BP control.   Comes back today. Here alone. Doing well. No complaints.  Feels good. No chest pain. Not short of breath. Energy level ok. Losing weight. Tolerating his medicines.    Current Outpatient Prescriptions  Medication Sig Dispense Refill  . amLODipine (NORVASC) 5 MG tablet Take 1 tablet (5 mg total) by mouth daily. 30 tablet 2  . aspirin EC 81 MG tablet Take 1 tablet (81 mg total) by mouth daily.    . hydrochlorothiazide (HYDRODIURIL) 25 MG tablet Take 1 tablet (25 mg total) by mouth daily. 90 tablet 3  . lisinopril (PRINIVIL,ZESTRIL) 20 MG tablet Take 1 tablet (20 mg total) by mouth daily. 30 tablet 2  . metoprolol tartrate (LOPRESSOR) 25 MG tablet Take 1 tablet (25 mg total) by mouth 2 (two) times daily. 60 tablet 2  . pravastatin (PRAVACHOL) 80 MG tablet Take 1 tablet (80 mg total) by mouth daily. 30 tablet 2  . nitroGLYCERIN (NITROSTAT) 0.4 MG SL tablet Place 1 tablet (0.4 mg total) under the tongue every 5 (five) minutes as needed for chest pain. 25 tablet 3   No current facility-administered medications for this visit.    No Known Allergies  Past Medical History  Diagnosis Date  . Hyperlipidemia   . Hypertension   . Coronary artery  disease     Prior inferior MI in 2005 with stent to RCA that subsequently occluded. Has left to right collaterals. Last cath in 2009 with stent to to LAD. EF 45 to 50%  . Obesity   . 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 Stress   . DJD (degenerative joint disease)   . Sleep apnea   . Anxiety   . Depression     PMH: 1. CAD: Inferior MI in 2005 with RCA stent. This stent subsequently occluded and he was found to have L => R collaterals.[name] Unstable angina developed in 8/09. LHC showed a 95% proximal LAD stenosis. He had a Promus DES to the proximal LAD. LV-gram showed EF 45-50%.  2. HTN 3. Hyperlipidemia 4. Obesity 5. OA 6. OSA: not on CPAP 7. Depression    Past Surgical History  Procedure Laterality Date  . Cardiac catheterization  10/09/2007    EF 45-50%  . Coronary stent placement      RIGHT CORONARY THAT SUBSEQUENTLY OCCLUDED.  [name] Coronary stent placement      LAD WITH A 3.0X15MM PROMUS DRUG-ELUTING STENT PLACED.  [name] Knee arthroscopy    . US echocardiography  03/14/2003    EF 55-60%  . Cardiovascular stress test  11/23/2008    EF 55%    History  Smoking status  . Never Smoker  Smokeless tobacco  . Not on file    History  Alcohol Use No    Family History  Problem Relation Age of Onset  . Hypertension Mother   . Lung cancer Father     Review of Systems: The review of systems is per the HPI.  All other systems were reviewed and are negative.  Physical Exam: BP 130/90 mmHg  Pulse 85  Ht 5' 10.5" (1.791 m)  Wt 256 lb 6.4 oz (116.302 kg)  BMI 36.26 kg/m2  SpO2 97% Patient is very pleasant and in no acute distress. Weight is down 2 more pounds. Skin is warm and dry. Color is normal.  HEENT is unremarkable. Normocephalic/atraumatic. PERRL. Sclera are nonicteric. Neck is supple. No masses. No JVD. Lungs are clear. Cardiac exam shows a regular rate and rhythm. Abdomen is soft. Extremities are without edema. Gait and ROM are intact. No gross neurologic deficits  noted.  Wt Readings from Last 3 Encounters:  01/19/14 256 lb 6.4 oz (116.302 kg)  12/22/13 258 lb (117.028 kg)  12/01/13 261 lb (118.389 kg)    LABORATORY DATA/PROCEDURES:  Lab Results  Component Value Date   WBC 8.8 12/16/2013   HGB 17.0 12/16/2013   HCT 50.2 12/16/2013   PLT 206.0 12/16/2013   GLUCOSE 121* 12/22/2013   CHOL 185 01/25/2011   TRIG 187.0* 01/25/2011   HDL 46.80 01/25/2011   LDLDIRECT 136.6 10/17/2010   LDLCALC 101* 01/25/2011   ALT 34 12/16/2013   AST 28 12/16/2013   NA 136 12/22/2013   K 3.9 12/22/2013   CL 102 12/22/2013   CREATININE 1.1 12/22/2013   BUN 10 12/22/2013   CO2 29 12/22/2013    BNP (last 3 results) No results for input(s): PROBNP in the last 8760 hours.  Echo Study Conclusions from October 2015  - Left ventricle: The cavity size was normal. Systolic function was normal. The estimated ejection fraction was in the range of 50% to 55%. Wall motion was normal; there were no regional wall motion abnormalities. Doppler parameters are consistent with abnormal left ventricular relaxation (grade 1 diastolic dysfunction). There was no evidence of elevated ventricular filling pressure by Doppler parameters. - Aortic valve: Trileaflet; normal thickness leaflets. There was no regurgitation. - Mitral valve: There was no regurgitation. - Right ventricle: Systolic function was normal. - Right atrium: The atrium was normal in size. - Tricuspid valve: There was no regurgitation. - Pulmonic valve: There was trivial regurgitation. - Pulmonary arteries: Systolic pressure couldn&'t be assessed. - Pericardium, extracardiac: There was no pericardial effusion.  Impressions:  - Normal biventricular size and systolic function. Abnormal relaxation with normal filling pressures. No significant valvular abnormality.  Assessment / Plan:  1. HTN: BP has improved despite no medicines today.  BMET today. See back in 4 months.  2.  Hyperlipidemia: back on pravastatin and for repeat fasting labs today  3. HYI:FOYDX study has been ordered by Dr. Aundra Dubin - For January 2016  4. CAD: No ischemic symptoms. Back on ASA 81, statin, metoprolol, lisinopril.   5. Ischemic cardiomyopathy: EF 45-50% on LV-gram with last cath. Most recent echo shows improvement in his EF - continue with medical therapy. He is doing well clinically.   I will see back in 4 months.   Patient is agreeable to this plan and will call if any problems develop in the interim.   Burtis Junes, RN, [facility] [address] [place], [place]  [phone]

## 2014-01-19 NOTE — Patient Instructions (Signed)
We will be checking the following labs today BMET, Lipids and HPF  Stay on your current medicines  Keep up the good work with exercise, diet and weight loss  I will see you in 4 months  Call the [place] office at [phone] if you have any questions, problems or concerns.

## 2014-01-20 LAB — LIPID PANEL
Cholesterol: 186 mg/dL (ref 0–200)
HDL: 38.3 mg/dL — ABNORMAL LOW (ref >39.00)
NonHDL: 147.7
Total CHOL/HDL Ratio: 5
Triglycerides: 257 mg/dL — ABNORMAL HIGH (ref 0.0–149.0)
VLDL: 51.4 mg/dL — ABNORMAL HIGH (ref 0.0–40.0)

## 2014-01-20 LAB — HEPATIC FUNCTION PANEL
ALT: 45 U/L (ref 0–53)
AST: 31 U/L (ref 0–37)
Albumin: 4.5 g/dL (ref 3.5–5.2)
Alkaline Phosphatase: 89 U/L (ref 39–117)
Bilirubin, Direct: 0.1 mg/dL (ref 0.0–0.3)
Total Bilirubin: 0.7 mg/dL (ref 0.2–1.2)
Total Protein: 7.6 g/dL (ref 6.0–8.3)

## 2014-01-20 LAB — BASIC METABOLIC PANEL
BUN: 15 mg/dL (ref 6–23)
CO2: 25 mEq/L (ref 19–32)
Calcium: 8.9 mg/dL (ref 8.4–10.5)
Chloride: 104 mEq/L (ref 96–112)
Creatinine, Ser: 1.1 mg/dL (ref 0.4–1.5)
GFR: 70.9 mL/min (ref >60.00)
Glucose, Bld: 110 mg/dL — ABNORMAL HIGH (ref 70–99)
Potassium: 4 mEq/L (ref 3.5–5.1)
Sodium: 139 mEq/L (ref 135–145)

## 2014-01-22 LAB — LDL CHOLESTEROL, DIRECT: Direct LDL: 95.2 mg/dL

## 2014-02-03 ENCOUNTER — Other Ambulatory Visit: Payer: BC Managed Care – PPO

## 2014-02-24 ENCOUNTER — Ambulatory Visit (HOSPITAL_BASED_OUTPATIENT_CLINIC_OR_DEPARTMENT_OTHER): Payer: BLUE CROSS/BLUE SHIELD | Attending: Cardiology

## 2014-02-24 VITALS — Ht 70.0 in | Wt 256.0 lb

## 2014-02-24 DIAGNOSIS — G4733 Obstructive sleep apnea (adult) (pediatric): Secondary | ICD-10-CM | POA: Diagnosis not present

## 2014-02-24 DIAGNOSIS — R0683 Snoring: Secondary | ICD-10-CM | POA: Insufficient documentation

## 2014-02-24 DIAGNOSIS — I255 Ischemic cardiomyopathy: Secondary | ICD-10-CM | POA: Diagnosis not present

## 2014-03-01 ENCOUNTER — Encounter (HOSPITAL_BASED_OUTPATIENT_CLINIC_OR_DEPARTMENT_OTHER): Payer: Self-pay

## 2014-03-01 ENCOUNTER — Telehealth: Payer: Self-pay | Admitting: Cardiology

## 2014-03-01 DIAGNOSIS — G4733 Obstructive sleep apnea (adult) (pediatric): Secondary | ICD-10-CM

## 2014-03-01 HISTORY — DX: Obstructive sleep apnea (adult) (pediatric): G47.33

## 2014-03-01 NOTE — Addendum Note (Signed)
Addended by: Fransico Him R on: 03/01/2014 11:00 PM   Modules accepted: Orders

## 2014-03-01 NOTE — Sleep Study (Signed)
   NAME: [name] [name] DATE OF BIRTH:  September 15, 1960 MEDICAL RECORD NUMBER [id number]  LOCATION: [facility]  PHYSICIAN: Janny Crute R  DATE OF STUDY: 02/24/2014  SLEEP STUDY TYPE: split night Nocturnal Polysomnogram with CPAP titration               REFERRING PHYSICIAN: Larey Dresser, MD  INDICATION FOR STUDY: excessive daytime sleepiness and restless sleep  EPWORTH SLEEPINESS SCORE: 7 HEIGHT: 5\' 10"  (177.8 cm)  WEIGHT: 256 lb (116.121 kg)    Body mass index is 36.73 kg/(m^2).  NECK SIZE: 18 in.  MEDICATIONS: Reviewed in the chart  SLEEP ARCHITECTURE: During the diagnostic portion of the study, the patient slept for a total of 120 minutes with no slow wave or REM sleep.  The onset to sleep latency was 70 minutes and sleep efficiency was reduced at 62%.  During the CPAP portion of the study the total sleep time was 194 minutes with 41 minutes of REM sleep.  The onset to sleep latency was was immediate and onset to REM latency was normal at 94 minutes.  The sleep efficiency was normal at 90%.    RESPIRATORY DATA: During the diagnostic study there were 63 obstructive apneas and 82 hypopneas, all occurring during NREM sleep and mainly in the nonsupine position.  The overall AHI was 72.5 events per hour consistent with severe Obstructive sleep apnea.  The patient was started on CPAP at 4cm H2O and titrated to 14cm H2O due to ongoing respiratory events and snoring.  The AHI at a pressure of 14cm H2O was 0.  The patient was able to sleep for prolonged periods of time at a CPAP of 14cm H2O in the REM supine position without further respiratory events.  The patient had severe snoring during the diagnostic study which resolved with CPAP therapy.  OXYGEN DATA: The nadir O2 saturation during the diagnostic study was 82% and during the CPAP titration was 84%.  During the CPAP titration there were 2 minutes of O2 sats < 88%.    CARDIAC DATA: The patient maintained NSR with  PVC's.  The heart rate ranged from 36 to 139bpm during the diagnostic study and 35 to 169bpm during the CPAP titration.    MOVEMENT/PARASOMNIA: There were no periodic limb movement disorders or REM behavior disorders during the study  IMPRESSION/ RECOMMENDATION:   1.  Severe obstructive sleep apnea/hypopnea syndrome with an AHI of 72.5 events per hour.  Most events occurred in the nonsupine position and all occurred during NREM sleep.   2.  Significant O2 desaturations with respiratory events. 3.  Reduced sleep efficiency with increased frequency of arousals.   4.  Successful CPAP titration to 14cm H2O during REM supine sleep with AHI of 0 and resolution of snoring.  Sleep efficiency improved to 97%.   Signed: Sueanne Margarita Diplomate, American Board of Sleep Medicine  ELECTRONICALLY SIGNED ON:  03/01/2014, 10:42 PM [place] PH: [phone]   FX: [phone] [place]

## 2014-03-01 NOTE — Telephone Encounter (Signed)
Please let patient know that he has severe OSA with AHI 72/hr and had successful CPAP titration to 14cm H2O.  Please let [facility] know that order is in EPIC for CPAP and to set up patient on CPAP.  Please set up OV with me in 10 weeks

## 2014-03-02 ENCOUNTER — Telehealth: Payer: Self-pay | Admitting: Cardiology

## 2014-03-02 NOTE — Telephone Encounter (Signed)
See phone note 03/01/14-

## 2014-03-02 NOTE — Telephone Encounter (Signed)
Left message to call back  

## 2014-03-02 NOTE — Telephone Encounter (Signed)
New Msg ° ° ° ° ° ° °Pt returning call. ° ° °Please call back. °

## 2014-03-02 NOTE — Telephone Encounter (Signed)
Patient informed of results and verbal understanding expressed.  OV scheduled for 4/7. Lindsay notified orders are in.

## 2014-03-24 ENCOUNTER — Other Ambulatory Visit: Payer: Self-pay | Admitting: Cardiology

## 2014-05-09 ENCOUNTER — Telehealth: Payer: Self-pay | Admitting: *Deleted

## 2014-05-09 NOTE — Telephone Encounter (Signed)
pts appointment needs to be moved, Cecille Rubin will be out of town that week

## 2014-05-09 NOTE — Telephone Encounter (Signed)
Pt schedule appointment with Dr. Radford Pax

## 2014-05-10 ENCOUNTER — Telehealth: Payer: Self-pay | Admitting: Nurse Practitioner

## 2014-05-10 NOTE — Telephone Encounter (Signed)
New Msg        Pt returning call from yesterday.    Please return call.

## 2014-05-10 NOTE — Telephone Encounter (Signed)
Pt called back to change appointment Cecille Rubin will be out of the office that day.  Appointment reschedule

## 2014-05-12 ENCOUNTER — Encounter: Payer: Self-pay | Admitting: Cardiology

## 2014-05-25 DIAGNOSIS — E669 Obesity, unspecified: Secondary | ICD-10-CM | POA: Insufficient documentation

## 2014-05-25 NOTE — Progress Notes (Signed)
Cardiology Office Note   Date:  05/26/2014   ID:  [name] [name], DOB 1960-04-07, MRN [id number]  PCP:  Florina Ou, MD    Chief Complaint  Patient presents with  . Sleep Apnea  . Hypertension  . Obesity      History of Present Illness: [name] [name] is a 54 y.o. male who presents for evaluation of obstructive sleep apnea.  The patient was referred for sleep study by Dr. Aundra Dubin.  This showed severe OSA with an AHI of 72 events per hour and he underwent successful CPAP titration to 14cm H2O.  He is doing well with his device.  He tolerates the nasal pillow mask and feels the pressure is adequate and actually he would like to have it turned down some if possible.  He no longer snores.  He feels rested in the am and has no daytime sleepiness.    Past Medical History  Diagnosis Date  . Hyperlipidemia   . Hypertension   . Coronary artery disease     Prior inferior MI in 2005 with stent to RCA that subsequently occluded. Has left to right collaterals. Last cath in 2009 with stent to to LAD. EF 45 to 50%  . Obesity   . 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 Stress   . DJD (degenerative joint disease)   . Sleep apnea   . Anxiety   . Depression   . OSA (obstructive sleep apnea) 03/01/2014    Severe with AHI 72.5 events/hr    Past Surgical History  Procedure Laterality Date  . Cardiac catheterization  10/09/2007    EF 45-50%  . Coronary stent placement      RIGHT CORONARY THAT SUBSEQUENTLY OCCLUDED.  [name] Coronary stent placement      LAD WITH A 3.0X15MM PROMUS DRUG-ELUTING STENT PLACED.  [name] Knee arthroscopy    . US echocardiography  03/14/2003    EF 55-60%  . Cardiovascular stress test  11/23/2008    EF 55%     Current Outpatient Prescriptions  Medication Sig Dispense Refill  . amLODipine (NORVASC) 5 MG tablet TAKE 1 TABLET BY MOUTH EVERY DAY 30 tablet 2  . aspirin EC 81 MG tablet Take 1 tablet (81 mg total) by mouth daily.    . hydrochlorothiazide (HYDRODIURIL) 25 MG  tablet Take 1 tablet (25 mg total) by mouth daily. 90 tablet 3  . lisinopril (PRINIVIL,ZESTRIL) 20 MG tablet TAKE 1 TABLET BY MOUTH EVERY DAY 30 tablet 2  . metoprolol tartrate (LOPRESSOR) 25 MG tablet Take 1 tablet (25 mg total) by mouth 2 (two) times daily. 60 tablet 2  . nitroGLYCERIN (NITROSTAT) 0.4 MG SL tablet Place 1 tablet (0.4 mg total) under the tongue every 5 (five) minutes as needed for chest pain. 25 tablet 3  . pravastatin (PRAVACHOL) 80 MG tablet TAKE 1 TABLET BY MOUTH EVERY DAY 30 tablet 2   No current facility-administered medications for this visit.    Allergies:   Review of patient's allergies indicates no known allergies.    Social History:  The patient  reports that he has never smoked. He does not have any smokeless tobacco history on file. He reports that he does not drink alcohol or use illicit drugs.   Family History:  The patient's family history includes Hypertension in his mother; Lung cancer in his father.    ROS:  Please see the history of present illness.   Otherwise, review of systems are positive for  none.   All other systems are reviewed and negative.    PHYSICAL EXAM: VS:  BP 130/88 mmHg  Pulse 73  Ht 5' 10.5" (1.791 m)  Wt 255 lb 6.4 oz (115.849 kg)  BMI 36.12 kg/m2  SpO2 96% , BMI Body mass index is 36.12 kg/(m^2). GEN: Well nourished, well developed, in no acute distress HEENT: normal Neck: no JVD, carotid bruits, or masses Cardiac: RRR; no murmurs, rubs, or gallops,no edema  Respiratory:  clear to auscultation bilaterally, normal work of breathing GI: soft, nontender, nondistended, + BS MS: no deformity or atrophy Skin: warm and dry, no rash Neuro:  Strength and sensation are intact Psych: euthymic mood, full affect   EKG:  EKG is not ordered today.    Recent Labs: 12/16/2013: Hemoglobin 17.0; Platelets 206.0 01/19/2014: ALT 45; BUN 15; Creatinine 1.1; Potassium 4.0; Sodium 139    Lipid Panel    Component Value Date/Time   CHOL  186 01/19/2014 0824   TRIG 257.0* 01/19/2014 0824   HDL 38.30* 01/19/2014 0824   CHOLHDL 5 01/19/2014 0824   VLDL 51.4* 01/19/2014 0824   LDLCALC 101* 01/25/2011 0926   LDLDIRECT 95.2 01/19/2014 0824      Wt Readings from Last 3 Encounters:  05/26/14 255 lb 6.4 oz (115.849 kg)  02/25/14 256 lb (116.121 kg)  01/19/14 256 lb 6.4 oz (116.302 kg)       ASSESSMENT AND PLAN:  1.  Severe obstructive sleep apnea with AHI 72 events per hour now on CPAP and tolerating well.  His download today showed an AHI of and % compliance in using more than 4 hours nightly.  I have counseled him to avoid sleeping in the supine position. 2.  HTN well controlled - continue amlodipine/HCTZ/ACE I/BB 3.  Obesity - I have encouraged him to continue with his exercise program that he just started after getting a fit bit   Current medicines are reviewed at length with the patient today.  The patient does not have concerns regarding medicines.  The following changes have been made:  no change  Labs/ tests ordered today include: see above assessment and plan No orders of the defined types were placed in this encounter.     Disposition:   FU with me  in 6 months   Signed, Sueanne Margarita, MD  05/26/2014 8:57 AM    [facility] [place], [place], [place]  [address] Phone: [phone]; Fax: [phone]

## 2014-05-26 ENCOUNTER — Encounter: Payer: Self-pay | Admitting: Cardiology

## 2014-05-26 ENCOUNTER — Ambulatory Visit (INDEPENDENT_AMBULATORY_CARE_PROVIDER_SITE_OTHER): Payer: BLUE CROSS/BLUE SHIELD | Admitting: Cardiology

## 2014-05-26 VITALS — BP 130/88 | HR 73 | Ht 70.5 in | Wt 255.4 lb

## 2014-05-26 DIAGNOSIS — I10 Essential (primary) hypertension: Secondary | ICD-10-CM

## 2014-05-26 DIAGNOSIS — G4733 Obstructive sleep apnea (adult) (pediatric): Secondary | ICD-10-CM

## 2014-05-26 DIAGNOSIS — E669 Obesity, unspecified: Secondary | ICD-10-CM

## 2014-05-26 NOTE — Patient Instructions (Signed)
Your physician wants you to follow-up in: 6 months with Dr Turner. (October 2016).  You will receive a reminder letter in the mail two months in advance. If you don't receive a letter, please call our office to schedule the follow-up appointment.  

## 2014-05-27 ENCOUNTER — Ambulatory Visit: Payer: BC Managed Care – PPO | Admitting: Nurse Practitioner

## 2014-06-02 ENCOUNTER — Other Ambulatory Visit: Payer: Self-pay | Admitting: Internal Medicine

## 2014-06-02 ENCOUNTER — Other Ambulatory Visit: Payer: Self-pay | Admitting: Cardiology

## 2014-06-06 ENCOUNTER — Other Ambulatory Visit (HOSPITAL_COMMUNITY): Payer: BLUE CROSS/BLUE SHIELD

## 2014-06-06 ENCOUNTER — Telehealth: Payer: Self-pay | Admitting: Cardiology

## 2014-06-06 ENCOUNTER — Other Ambulatory Visit: Payer: Self-pay | Admitting: Cardiology

## 2014-06-06 NOTE — Telephone Encounter (Signed)
Patient requesting refills for Norvasc. Informed patient that refill is already placed and the request has been received by the pharmacy and should be ready for pick-up. Patient also asking about his CPAP download and whether or not any settings should be changed. Informed patient he will get a call back once Dr. Radford Pax reviews download.  Remote download obtained and printed for Dr. Theodosia Blender review.

## 2014-06-06 NOTE — Telephone Encounter (Signed)
Per Dr. Radford Pax, informed the patient that his download and settings are fine. Patient st that it feels like there is too much air coming out once he starts to rest and calm down. He st it is uncomfortable and wakes him up occasionally.  To Dr. Radford Pax for recommendations.

## 2014-06-06 NOTE — Telephone Encounter (Signed)
Pt c/o medication issue:  1. Name of Medication: amLODipine  2. How are you currently taking this medication (dosage and times per day)?   3. Are you having a reaction (difficulty breathing--STAT)?  4. What is your medication issue? The medication wasn't refilled and he wants to know if he should continue to take the medication. Pt would also like to discuss results and pressure.

## 2014-06-07 NOTE — Telephone Encounter (Signed)
Follow up ° ° ° ° °Returned Katy's call °

## 2014-06-07 NOTE — Telephone Encounter (Signed)
Please have him take his device to the DME - he may need to set ramp for a longer period of time

## 2014-06-07 NOTE — Telephone Encounter (Signed)
Left message to call back  

## 2014-06-07 NOTE — Telephone Encounter (Signed)
Left message for patient that he will need to take his device to [facility]. Instructed patient to wait to hear from them to make an appointment to bring his device in. Instructed patient to call if he has any questions or concerns.

## 2014-06-10 ENCOUNTER — Encounter: Payer: Self-pay | Admitting: Nurse Practitioner

## 2014-06-10 ENCOUNTER — Ambulatory Visit (INDEPENDENT_AMBULATORY_CARE_PROVIDER_SITE_OTHER): Payer: BLUE CROSS/BLUE SHIELD | Admitting: Nurse Practitioner

## 2014-06-10 VITALS — BP 112/80 | HR 72 | Ht 70.0 in | Wt 255.0 lb

## 2014-06-10 DIAGNOSIS — G4733 Obstructive sleep apnea (adult) (pediatric): Secondary | ICD-10-CM | POA: Diagnosis not present

## 2014-06-10 DIAGNOSIS — I10 Essential (primary) hypertension: Secondary | ICD-10-CM

## 2014-06-10 DIAGNOSIS — E669 Obesity, unspecified: Secondary | ICD-10-CM

## 2014-06-10 DIAGNOSIS — E785 Hyperlipidemia, unspecified: Secondary | ICD-10-CM | POA: Diagnosis not present

## 2014-06-10 DIAGNOSIS — I2510 Atherosclerotic heart disease of native coronary artery without angina pectoris: Secondary | ICD-10-CM | POA: Diagnosis not present

## 2014-06-10 NOTE — Progress Notes (Signed)
CARDIOLOGY OFFICE NOTE  Date:  06/10/2014    [name] [name] Date of Birth: Aug 01, 1960 Medical Record #[id number]  PCP:  Florina Ou, MD  Cardiologist:  Aundra Dubin    Chief Complaint  Patient presents with  . Coronary Artery Disease    Follow up visit - seen for Dr. Aundra Dubin  . Hypertension     History of Present Illness: [name] [name] is a 54 y.o. male who presents today for a follow up visit. Seen for Dr. Aundra Dubin. Former patient of Dr. Susa Simmonds. He has a history of CAD s/p inferior MI in 2005. Now with chronically occluded RCA and L=>R collaterals. He had Promus DES to the proximal LAD in 8/09 due to unstable angina. He has had noncompliance with medicines due to losing his job and insurance but now has a new job.  Seen here back in the fall of 2015. He had been off all his meds x 1 year. BP was been running high. Despite being off his meds, he had minimal symptoms. His medicines were restarted. Echo updated. EF is ok. I last saw him in December - he was doing well. Has seen Dr. Radford Pax for sleep evaluation.  Comes back today. Here alone.  Tolerating his CPAP. Really does not feel any different. No chest pain. Not short of breath. Continues to enjoy his [place] fighting. BP is great. He is taking his medicines. 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 He is eating better. Has not really lost any more weight but has not gained. He is happy with how he is doing.  Past Medical History  Diagnosis Date  . Hyperlipidemia   . Hypertension   . Coronary artery disease     Prior inferior MI in 2005 with stent to RCA that subsequently occluded. Has left to right collaterals. Last cath in 2009 with stent to to LAD. EF 45 to 50%  . Obesity   . 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 Stress   . DJD (degenerative joint disease)   . Sleep apnea   . Anxiety   . Depression   . OSA (obstructive sleep apnea) 03/01/2014    Severe with AHI 72.5  events/hr    Past Surgical History  Procedure Laterality Date  . Cardiac catheterization  10/09/2007    EF 45-50%  . Coronary stent placement      RIGHT CORONARY THAT SUBSEQUENTLY OCCLUDED.  [name] Coronary stent placement      LAD WITH A 3.0X15MM PROMUS DRUG-ELUTING STENT PLACED.  [name] Knee arthroscopy    . US echocardiography  03/14/2003    EF 55-60%  . Cardiovascular stress test  11/23/2008    EF 55%     Medications: Current Outpatient Prescriptions  Medication Sig Dispense Refill  . amLODipine (NORVASC) 5 MG tablet TAKE 1 TABLET BY MOUTH EVERY DAY 30 tablet 2  . aspirin EC 81 MG tablet Take 1 tablet (81 mg total) by mouth daily.    . hydrochlorothiazide (HYDRODIURIL) 25 MG tablet Take 1 tablet (25 mg total) by mouth daily. 90 tablet 3  . lisinopril (PRINIVIL,ZESTRIL) 20 MG tablet TAKE 1 TABLET BY MOUTH EVERY DAY 30 tablet 2  . metoprolol tartrate (LOPRESSOR) 25 MG tablet Take 1 tablet (25 mg total) by mouth 2 (two) times daily. 60 tablet 2  . pravastatin (PRAVACHOL) 80 MG tablet TAKE 1 TABLET BY MOUTH EVERY DAY 30 tablet 2  . nitroGLYCERIN (  NITROSTAT) 0.4 MG SL tablet Place 1 tablet (0.4 mg total) under the tongue every 5 (five) minutes as needed for chest pain. 25 tablet 3   No current facility-administered medications for this visit.    Allergies: No Known Allergies  Social History: The patient  reports that he has never smoked. He does not have any smokeless tobacco history on file. He reports that he does not drink alcohol or use illicit drugs.   Family History: The patient's family history includes Hypertension in his mother; Lung cancer in his father.   Review of Systems: Please see the history of present illness.    All other systems are reviewed and negative.   Physical Exam: VS:  BP 112/80 mmHg  Pulse 72  Ht 5\' 10"  (1.778 m)  Wt 255 lb (115.667 kg)  BMI 36.59 kg/m2 .  BMI Body mass index is 36.59 kg/(m^2).  Wt Readings from Last 3 Encounters:  06/10/14 255 lb  (115.667 kg)  05/26/14 255 lb 6.4 oz (115.849 kg)  02/25/14 256 lb (116.121 kg)    General: Pleasant. Well developed, well nourished and in no acute distress.  HEENT: Normal. Neck: Supple, no JVD, carotid bruits, or masses noted.  Cardiac: Regular rate and rhythm. No murmurs, rubs, or gallops. No edema.  Respiratory:  Lungs are clear to auscultation bilaterally with normal work of breathing.  GI: Soft and nontender.  MS: No deformity or atrophy. Gait and ROM intact. Skin: Warm and dry. Color is normal.  Neuro:  Strength and sensation are intact and no gross focal deficits noted.  Psych: Alert, appropriate and with normal affect.   LABORATORY DATA:  EKG:  EKG is not ordered today.   Lab Results  Component Value Date   WBC 8.8 12/16/2013   HGB 17.0 12/16/2013   HCT 50.2 12/16/2013   PLT 206.0 12/16/2013   GLUCOSE 110* 01/19/2014   CHOL 186 01/19/2014   TRIG 257.0* 01/19/2014   HDL 38.30* 01/19/2014   LDLDIRECT 95.2 01/19/2014   LDLCALC 101* 01/25/2011   ALT 45 01/19/2014   AST 31 01/19/2014   NA 139 01/19/2014   K 4.0 01/19/2014   CL 104 01/19/2014   CREATININE 1.1 01/19/2014   BUN 15 01/19/2014   CO2 25 01/19/2014    BNP (last 3 results) No results for input(s): BNP in the last 8760 hours.  ProBNP (last 3 results) No results for input(s): PROBNP in the last 8760 hours.   Other Studies Reviewed Today:   Echo Study Conclusions from October 2015  - Left ventricle: The cavity size was normal. Systolic function was normal. The estimated ejection fraction was in the range of 50% to 55%. Wall motion was normal; there were no regional wall motion abnormalities. Doppler parameters are consistent with abnormal left ventricular relaxation (grade 1 diastolic dysfunction). There was no evidence of elevated ventricular filling pressure by Doppler parameters. - Aortic valve: Trileaflet; normal thickness leaflets. There was no regurgitation. - Mitral  valve: There was no regurgitation. - Right ventricle: Systolic function was normal. - Right atrium: The atrium was normal in size. - Tricuspid valve: There was no regurgitation. - Pulmonic valve: There was trivial regurgitation. - Pulmonary arteries: Systolic pressure couldn&'t be assessed. - Pericardium, extracardiac: There was no pericardial effusion.  Impressions:  - Normal biventricular size and systolic function. Abnormal relaxation with normal filling pressures. No significant valvular abnormality.  Assessment / Plan:  1. HTN: BP looks good on current regimen.   2. Hyperlipidemia: back on statin -  needs labs on return.   3. BBU:[id number] CPAP - followed by Dr. Radford Pax.   4. CAD: No ischemic symptoms. Back on ASA 81, statin, metoprolol, lisinopril.   5. Ischemic cardiomyopathy: EF 45-50% on LV-gram with last cath. Most recent echo shows improvement in his EF - continue with medical therapy. He is doing well clinically.   Current medicines are reviewed with the patient today.  The patient does not have concerns regarding medicines other than what has been noted above.  The following changes have been made:  See above.  Labs/ tests ordered today include:   No orders of the defined types were placed in this encounter.     Disposition:   FU with me in 6 months.   Patient is agreeable to this plan and will call if any problems develop in the interim.   Signed: Burtis Junes, RN, ANP-C 06/10/2014 8:06 AM  [facility] [address] [place], [place]  [address] Phone: [phone] Fax: [phone]

## 2014-06-10 NOTE — Patient Instructions (Addendum)
We will be checking the following labs today - NONE  Medication Instructions:    Continue with your current medicines.     Testing/Procedures To Be Arranged:  N/A  Follow-Up:  We will see you back in 6 months with fasting labs.    Other Special Instructions:   Stay active and keep working on your weight.  Call the [place] office at [phone] if you have any questions, problems or concerns.

## 2014-06-21 ENCOUNTER — Encounter: Payer: Self-pay | Admitting: Cardiology

## 2014-06-24 ENCOUNTER — Telehealth: Payer: Self-pay | Admitting: Cardiology

## 2014-06-24 DIAGNOSIS — G4733 Obstructive sleep apnea (adult) (pediatric): Secondary | ICD-10-CM

## 2014-06-24 NOTE — Telephone Encounter (Signed)
New Message  Pt called in regards to a Rx for CPAP machine. No further details. Please call back to discuss

## 2014-06-24 NOTE — Telephone Encounter (Signed)
Left message to call back  

## 2014-06-29 NOTE — Telephone Encounter (Signed)
Pt stated that he spoke to a tech to let him know that there is a lot of pressure in his machine. He said that the tech agreed that 14

## 2014-06-29 NOTE — Telephone Encounter (Signed)
Decrease CPAP to 12cm H2O and get a d/l from DME in 2 weeks

## 2014-06-29 NOTE — Telephone Encounter (Signed)
Left message to call back  

## 2014-06-29 NOTE — Telephone Encounter (Signed)
Pt stated that he spoke to a tech with [facility] to tell him that there is too much pressure in his nasal pillow device. He said that it is set on 14cm H2O and the tech said that he felt it was too much as well because the tech said that typically 12 is the highest setting for the nasal pillows.  Pt is asking if there is anything that can be done about the settings.

## 2014-06-30 NOTE — Telephone Encounter (Signed)
Order placed to DECREASE PAP to 12 cm H2O and get a download in 2 weeks.

## 2014-07-02 ENCOUNTER — Other Ambulatory Visit: Payer: Self-pay | Admitting: Cardiology

## 2014-07-08 ENCOUNTER — Encounter: Payer: Self-pay | Admitting: Cardiology

## 2014-07-21 ENCOUNTER — Encounter: Payer: Self-pay | Admitting: Cardiology

## 2014-08-04 ENCOUNTER — Other Ambulatory Visit: Payer: Self-pay | Admitting: Cardiology

## 2014-10-11 ENCOUNTER — Encounter: Payer: Self-pay | Admitting: *Deleted

## 2014-12-16 ENCOUNTER — Ambulatory Visit: Payer: BLUE CROSS/BLUE SHIELD | Admitting: Nurse Practitioner

## 2014-12-20 ENCOUNTER — Ambulatory Visit: Payer: BLUE CROSS/BLUE SHIELD | Admitting: Nurse Practitioner

## 2014-12-30 ENCOUNTER — Encounter: Payer: Self-pay | Admitting: Nurse Practitioner

## 2015-01-09 ENCOUNTER — Other Ambulatory Visit: Payer: Self-pay | Admitting: Nurse Practitioner

## 2017-07-01 DIAGNOSIS — M25562 Pain in left knee: Secondary | ICD-10-CM | POA: Insufficient documentation

## 2017-09-03 ENCOUNTER — Ambulatory Visit: Payer: BLUE CROSS/BLUE SHIELD | Admitting: Nurse Practitioner

## 2017-09-24 DIAGNOSIS — R9430 Abnormal result of cardiovascular function study, unspecified: Secondary | ICD-10-CM | POA: Insufficient documentation

## 2017-09-24 DIAGNOSIS — R931 Abnormal findings on diagnostic imaging of heart and coronary circulation: Secondary | ICD-10-CM | POA: Insufficient documentation

## 2017-10-02 ENCOUNTER — Encounter (HOSPITAL_COMMUNITY): Payer: Self-pay | Admitting: *Deleted

## 2017-10-03 ENCOUNTER — Ambulatory Visit: Payer: Self-pay | Admitting: Orthopedic Surgery

## 2017-10-07 DIAGNOSIS — M1712 Unilateral primary osteoarthritis, left knee: Secondary | ICD-10-CM | POA: Insufficient documentation

## 2017-10-14 NOTE — Progress Notes (Signed)
LAST EKG REQUESTED [place]

## 2017-10-14 NOTE — Progress Notes (Signed)
Clearance Dr Nicholes Mango and Fonnie Jarvis, NP on chart   LOV with Fonnie Jarvis, NP on chart   CARDIOLITE STRESS TEST 09-17-17 ON CHART  From [facility]  [id number] 09-01-17 ON CHART  From [facility]

## 2017-10-14 NOTE — Patient Instructions (Addendum)
[name] [name]  10/14/2017   Your procedure is scheduled on: 10-23-17   Report to [facility] Main  Entrance    Report to admitting at 7:30AM    Call this number if you have problems the morning of surgery [phone]       Remember: Do not eat food or drink liquids :After Midnight.     Take these medicines the morning of surgery with A SIP OF WATER: METOPROLOL, PRAVASTATIN                                 You may not have any metal on your body including hair pins and              piercings  Do not wear jewelry, make-up, lotions, powders or perfumes, deodorant              Men may shave face and neck.   Do not bring valuables to the hospital. [name] [name].  Contacts, dentures or bridgework may not be worn into surgery.  Leave suitcase in the car. After surgery it may be brought to your room.                 Please read over the following fact sheets you were given: _____________________________________________________________________             [name] - Preparing for Surgery Before surgery, you can play an important role.  Because skin is not sterile, your skin needs to be as free of germs as possible.  You can reduce the number of germs on your skin by washing with CHG (chlorahexidine gluconate) soap before surgery.  CHG is an antiseptic cleaner which kills germs and bonds with the skin to continue killing germs even after washing. Please DO NOT use if you have an allergy to CHG or antibacterial soaps.  If your skin becomes reddened/irritated stop using the CHG and inform your nurse when you arrive at Short Stay. Do not shave (including legs and underarms) for at least 48 hours prior to the first CHG shower.  You may shave your face/neck. Please follow these instructions carefully:  1.  Shower with CHG Soap the night before surgery and the  morning of Surgery.  2.  If you choose to wash your  hair, wash your hair first as usual with your  normal  shampoo.  3.  After you shampoo, rinse your hair and body thoroughly to remove the  shampoo.                           4.  Use CHG as you would any other liquid soap.  You can apply chg directly  to the skin and wash                       Gently with a scrungie or clean washcloth.  5.  Apply the CHG Soap to your body ONLY FROM THE NECK DOWN.   Do not use on face/ open                           Wound or open  sores. Avoid contact with eyes, ears mouth and genitals (private parts).                       Wash face,  Genitals (private parts) with your normal soap.             6.  Wash thoroughly, paying special attention to the area where your surgery  will be performed.  7.  Thoroughly rinse your body with warm water from the neck down.  8.  DO NOT shower/wash with your normal soap after using and rinsing off  the CHG Soap.                9.  Pat yourself dry with a clean towel.            10.  Wear clean pajamas.            11.  Place clean sheets on your bed the night of your first shower and do not  sleep with pets. Day of Surgery : Do not apply any lotions/deodorants the morning of surgery.  Please wear clean clothes to the hospital/surgery center.  FAILURE TO FOLLOW THESE INSTRUCTIONS MAY RESULT IN THE CANCELLATION OF YOUR SURGERY PATIENT SIGNATURE_________________________________  NURSE SIGNATURE__________________________________  ________________________________________________________________________   [name]  An incentive spirometer is a tool that can help keep your lungs clear and active. This tool measures how well you are filling your lungs with each breath. Taking long deep breaths may help reverse or decrease the chance of developing breathing (pulmonary) problems (especially infection) following:  A long period of time when you are unable to move or be active. BEFORE THE PROCEDURE   If the spirometer  includes an indicator to show your best effort, your nurse or respiratory therapist will set it to a desired goal.  If possible, sit up straight or lean slightly forward. Try not to slouch.  Hold the incentive spirometer in an upright position. INSTRUCTIONS FOR USE  1. Sit on the edge of your bed if possible, or sit up as far as you can in bed or on a chair. 2. Hold the incentive spirometer in an upright position. 3. Breathe out normally. 4. Place the mouthpiece in your mouth and seal your lips tightly around it. 5. Breathe in slowly and as deeply as possible, raising the piston or the ball toward the top of the column. 6. Hold your breath for 3-5 seconds or for as long as possible. Allow the piston or ball to fall to the bottom of the column. 7. Remove the mouthpiece from your mouth and breathe out normally. 8. Rest for a few seconds and repeat Steps 1 through 7 at least 10 times every 1-2 hours when you are awake. Take your time and take a few normal breaths between deep breaths. 9. The spirometer may include an indicator to show your best effort. Use the indicator as a goal to work toward during each repetition. 10. After each set of 10 deep breaths, practice coughing to be sure your lungs are clear. If you have an incision (the cut made at the time of surgery), support your incision when coughing by placing a pillow or rolled up towels firmly against it. Once you are able to get out of bed, walk around indoors and cough well. You may stop using the incentive spirometer when instructed by your caregiver.  RISKS AND COMPLICATIONS  Take your time so you do not get dizzy or light-headed.  If you are in pain, you may need to take or ask for pain medication before doing incentive spirometry. It is harder to take a deep breath if you are having pain. AFTER USE  Rest and breathe slowly and easily.  It can be helpful to keep track of a log of your progress. Your caregiver can provide you with a  simple table to help with this. If you are using the spirometer at home, follow these instructions: [name] [name] IF:   You are having difficultly using the spirometer.  You have trouble using the spirometer as often as instructed.  Your pain medication is not giving enough relief while using the spirometer.  You develop fever of 100.5 F (38.1 C) or higher. SEEK IMMEDIATE MEDICAL CARE IF:   You cough up bloody sputum that had not been present before.  You develop fever of 102 F (38.9 C) or greater.  You develop worsening pain at or near the incision site. MAKE SURE YOU:   Understand these instructions.  Will watch your condition.  Will get help right away if you are not doing well or get worse. Document Released: 06/17/2006 Document Revised: 04/29/2011 Document Reviewed: 08/18/2006 Exit[employer] Patient Information 2014 [employer], [place].   ________________________________________________________________________  WHAT IS A BLOOD TRANSFUSION? Blood Transfusion Information  A transfusion is the replacement of blood or some of its parts. Blood is made up of multiple cells which provide different functions.  Red blood cells carry oxygen and are used for blood loss replacement.  White blood cells fight against infection.  Platelets control bleeding.  Plasma helps clot blood.  Other blood products are available for specialized needs, such as hemophilia or other clotting disorders. BEFORE THE TRANSFUSION  Who gives blood for transfusions?   Healthy volunteers who are fully evaluated to make sure their blood is safe. This is blood bank blood. Transfusion therapy is the safest it has ever been in the practice of medicine. Before blood is taken from a donor, a complete history is taken to make sure that person has no history of diseases nor engages in risky social behavior (examples are intravenous drug use or sexual activity with multiple partners). The donor's travel history  is screened to minimize risk of transmitting infections, such as malaria. The donated blood is tested for signs of infectious diseases, such as HIV and hepatitis. The blood is then tested to be sure it is compatible with you in order to minimize the chance of a transfusion reaction. If you or a relative donates blood, this is often done in anticipation of surgery and is not appropriate for emergency situations. It takes many days to process the donated blood. RISKS AND COMPLICATIONS Although transfusion therapy is very safe and saves many lives, the main dangers of transfusion include:   Getting an infectious disease.  Developing a transfusion reaction. This is an allergic reaction to something in the blood you were given. Every precaution is taken to prevent this. The decision to have a blood transfusion has been considered carefully by your caregiver before blood is given. Blood is not given unless the benefits outweigh the risks. AFTER THE TRANSFUSION  Right after receiving a blood transfusion, you will usually feel much better and more energetic. This is especially true if your red blood cells have gotten low (anemic). The transfusion raises the level of the red blood cells which carry oxygen, and this usually causes an energy increase.  The nurse administering the transfusion will monitor you carefully for  complications. HOME CARE INSTRUCTIONS  No special instructions are needed after a transfusion. You may find your energy is better. Speak with your caregiver about any limitations on activity for underlying diseases you may have. SEEK MEDICAL CARE IF:   Your condition is not improving after your transfusion.  You develop redness or irritation at the intravenous (IV) site. SEEK IMMEDIATE MEDICAL CARE IF:  Any of the following symptoms occur over the next 12 hours:  Shaking chills.  You have a temperature by mouth above 102 F (38.9 C), not controlled by medicine.  Chest, back, or  muscle pain.  People around you feel you are not acting correctly or are confused.  Shortness of breath or difficulty breathing.  Dizziness and fainting.  You get a rash or develop hives.  You have a decrease in urine output.  Your urine turns a dark color or changes to pink, red, or brown. Any of the following symptoms occur over the next 10 days:  You have a temperature by mouth above 102 F (38.9 C), not controlled by medicine.  Shortness of breath.  Weakness after normal activity.  The white part of the eye turns yellow (jaundice).  You have a decrease in the amount of urine or are urinating less often.  Your urine turns a dark color or changes to pink, red, or brown. Document Released: 02/02/2000 Document Revised: 04/29/2011 Document Reviewed: 09/21/2007 [facility] Patient Information 2014 [place], [place].  _______________________________________________________________________

## 2017-10-16 ENCOUNTER — Encounter (HOSPITAL_COMMUNITY): Payer: Self-pay

## 2017-10-16 ENCOUNTER — Encounter (HOSPITAL_COMMUNITY)
Admission: RE | Admit: 2017-10-16 | Discharge: 2017-10-16 | Disposition: A | Discharge status: Home / Self Care | Payer: 59 | Source: Ambulatory Visit | Attending: Orthopedic Surgery | Admitting: Orthopedic Surgery

## 2017-10-16 ENCOUNTER — Other Ambulatory Visit: Payer: Self-pay

## 2017-10-16 DIAGNOSIS — Z01812 Encounter for preprocedural laboratory examination: Secondary | ICD-10-CM | POA: Diagnosis not present

## 2017-10-16 DIAGNOSIS — M1712 Unilateral primary osteoarthritis, left knee: Secondary | ICD-10-CM | POA: Diagnosis not present

## 2017-10-16 LAB — SURGICAL PCR SCREEN
MRSA, PCR: NEGATIVE
STAPHYLOCOCCUS AUREUS: NEGATIVE

## 2017-10-16 LAB — BASIC METABOLIC PANEL
ANION GAP: 11 (ref 5–15)
BUN: 15 mg/dL (ref 6–20)
CO2: 26 mmol/L (ref 22–32)
Calcium: 9.2 mg/dL (ref 8.9–10.3)
Chloride: 101 mmol/L (ref 98–111)
Creatinine, Ser: 1.17 mg/dL (ref 0.61–1.24)
GFR calc Af Amer: >60 mL/min (ref >60)
Glucose, Bld: 89 mg/dL (ref 70–99)
Potassium: 4.1 mmol/L (ref 3.5–5.1)
SODIUM: 138 mmol/L (ref 135–145)

## 2017-10-16 LAB — CBC
HEMATOCRIT: 47.3 % (ref 39.0–52.0)
Hemoglobin: 16.7 g/dL (ref 13.0–17.0)
MCH: 31.6 pg (ref 26.0–34.0)
MCHC: 35.3 g/dL (ref 30.0–36.0)
MCV: 89.4 fL (ref 78.0–100.0)
PLATELETS: 178 10*3/uL (ref 150–400)
RBC: 5.29 MIL/uL (ref 4.22–5.81)
RDW: 13.6 % (ref 11.5–15.5)
WBC: 7.8 10*3/uL (ref 4.0–10.5)

## 2017-10-16 LAB — ABO/RH: ABO/RH(D): O POS

## 2017-10-16 NOTE — Progress Notes (Signed)
EKG 09-03-17 ON CHART FROM [facility]

## 2017-10-22 ENCOUNTER — Ambulatory Visit: Payer: Self-pay | Admitting: Orthopedic Surgery

## 2017-10-22 NOTE — H&P (Signed)
TOTAL KNEE ADMISSION H&P  Patient is being admitted for left total knee arthroplasty.  Subjective:  Chief Complaint:left knee pain.  HPI: [name] [name], 57 y.o. male, has a history of pain and functional disability in the left knee due to trauma and has failed non-surgical conservative treatments for greater than 12 weeks to includeNSAID's and/or analgesics, corticosteriod injections, flexibility and strengthening excercises, use of assistive devices, weight reduction as appropriate and activity modification.  Onset of symptoms was gradual, starting >10 years ago with rapidlly worsening course since that time. The patient noted prior procedures on the knee to include  MCL reconstruction on the left knee(s).  Patient currently rates pain in the left knee(s) at 10 out of 10 with activity. Patient has night pain, worsening of pain with activity and weight bearing, pain that interferes with activities of daily living, pain with passive range of motion, crepitus and joint swelling.  Patient has evidence of subchondral cysts, subchondral sclerosis, periarticular osteophytes, joint subluxation, joint space narrowing and retained HW by imaging studies. There is no active infection.  Patient Active Problem List   Diagnosis Date Noted  . Obesity (BMI 30-39.9) 05/25/2014  . OSA (obstructive sleep apnea) 03/01/2014  . CAD (coronary artery disease) 10/17/2010  . HTN (hypertension) 10/17/2010  . Hyperlipidemia 10/17/2010  . Situational stress 10/17/2010   Past Medical History:  Diagnosis Date  . Anxiety   . 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 Has left to right collaterals. Last cath in 2009 with stent to to LAD. EF 45 to 50%  . Depression   . DJD (degenerative joint disease)   . Hyperlipidemia   . Hypertension   . 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 Obesity   . OSA (obstructive sleep apnea) 03/01/2014   Severe with AHI 72.5 events/hr; no  CPAP in use since weight loss   . Sleep apnea   . Stress     Past Surgical History:  Procedure Laterality Date  . ANTERIOR CRUCIATE LIGAMENT REPAIR Right 1990  . CARDIAC CATHETERIZATION  10/09/2007   EF 45-50%  . CARDIOVASCULAR STRESS TEST  11/23/2008   EF 55%  . CORONARY STENT PLACEMENT     RIGHT CORONARY THAT SUBSEQUENTLY OCCLUDED.  [name] CORONARY STENT PLACEMENT     LAD WITH A 3.0X15MM PROMUS DRUG-ELUTING STENT PLACED.  [name] KNEE ARTHROSCOPY Left 1982  . US ECHOCARDIOGRAPHY  03/14/2003   EF 55-60%    Current Outpatient Medications  Medication Sig Dispense Refill Last Dose  . aspirin EC 325 MG tablet Take 325 mg by mouth daily.  1   . hydrochlorothiazide (HYDRODIURIL) 25 MG tablet TAKE 1 TABLET (25 MG TOTAL) BY MOUTH DAILY. (Patient taking differently: Take 25 mg by mouth daily. ) 90 tablet 2   . lisinopril (PRINIVIL,ZESTRIL) 20 MG tablet TAKE 1 TABLET BY MOUTH EVERY DAY 30 tablet 5   . metoprolol tartrate (LOPRESSOR) 25 MG tablet Take 1 tablet (25 mg total) by mouth 2 (two) times daily. 60 tablet 2 Taking  . OVER THE COUNTER MEDICATION Apply 1 application topically 2 (two) times daily as needed (for pain.). [place] ([place], [place], [place], and [place])     . pravastatin (PRAVACHOL) 40 MG tablet Take 40 mg by mouth daily.  1    No current facility-administered medications for this visit.    No Known Allergies  Social History   Tobacco Use  .  Smoking status: Never Smoker  . Smokeless tobacco: Never Used  Substance Use Topics  . Alcohol use: Yes    Comment: occ    Family History  Problem Relation Age of Onset  . Hypertension Mother   . Lung cancer Father      Review of Systems  Constitutional: Negative.   HENT: Negative.   Eyes: Negative.   Respiratory: Negative.   Cardiovascular: Negative.   Gastrointestinal: Negative.   Genitourinary: Negative.   Musculoskeletal: Positive for joint pain.  Skin: Negative.    Neurological: Negative.   Endo/Heme/Allergies: Negative.   Psychiatric/Behavioral: Negative.     Objective:  Physical Exam  Vitals reviewed. Constitutional: He is oriented to person, place, and time. He appears well-developed and well-nourished.  HENT:  Head: Normocephalic and atraumatic.  Eyes: Pupils are equal, round, and reactive to light. Conjunctivae and EOM are normal.  Neck: Normal range of motion. Neck supple.  Cardiovascular: Normal rate, regular rhythm, normal heart sounds and intact distal pulses.  Respiratory: Effort normal and breath sounds normal. No respiratory distress.  GI: Soft. Bowel sounds are normal. He exhibits no distension.  Genitourinary:  Genitourinary Comments: deferred  Musculoskeletal:       Left knee: He exhibits decreased range of motion, swelling and abnormal alignment. Tenderness found. Medial joint line and lateral joint line tenderness noted.  Neurological: He is alert and oriented to person, place, and time. He has normal reflexes.  Skin: Skin is warm and dry.  Psychiatric: He has a normal mood and affect. His behavior is normal. Judgment and thought content normal.    Vital signs in last 24 hours: @VSRANGES @  Labs:   Estimated body mass index is 34.8 kg/m as calculated from the following:   Height as of 10/16/17: 5' 10.5" (1.791 m).   Weight as of 10/16/17: 111.6 kg.   Imaging Review Plain radiographs demonstrate severe degenerative joint disease of the left knee(s). The overall alignment issignificant varus. The bone quality appears to be adequate for age and reported activity level.   Preoperative templating of the joint replacement has been completed, documented, and submitted to the Operating Room personnel in order to optimize intra-operative equipment management.   Anticipated LOS equal to or greater than 2 midnights due to - Age 80 and older with one or more of the following:  - Obesity  - Expected need for hospital services  (PT, OT, Nursing) required for safe  discharge  - Anticipated need for postoperative skilled nursing care or inpatient rehab  - Active co-morbidities: Respiratory Failure/COPD OR   - Unanticipated findings during/Post Surgery: None  - Patient is a high risk of re-admission due to: None     Assessment/Plan:  End stage arthritis, left knee   The patient history, physical examination, clinical judgment of the provider and imaging studies are consistent with end stage degenerative joint disease of the left knee(s) and total knee arthroplasty is deemed medically necessary. The treatment options including medical management, injection therapy arthroscopy and arthroplasty were discussed at length. The risks and benefits of total knee arthroplasty were presented and reviewed. The risks due to aseptic loosening, infection, stiffness, patella tracking problems, thromboembolic complications and other imponderables were discussed. The patient acknowledged the explanation, agreed to proceed with the plan and consent was signed. Patient is being admitted for inpatient treatment for surgery, pain control, PT, OT, prophylactic antibiotics, VTE prophylaxis, progressive ambulation and ADL's and discharge planning. The patient is planning to be discharged home with outpatient PT

## 2017-10-22 NOTE — H&P (View-Only) (Signed)
TOTAL KNEE ADMISSION H&P  Patient is being admitted for left total knee arthroplasty.  Subjective:  Chief Complaint:left knee pain.  HPI: [name] [name], 57 y.o. male, has a history of pain and functional disability in the left knee due to trauma and has failed non-surgical conservative treatments for greater than 12 weeks to includeNSAID's and/or analgesics, corticosteriod injections, flexibility and strengthening excercises, use of assistive devices, weight reduction as appropriate and activity modification.  Onset of symptoms was gradual, starting >10 years ago with rapidlly worsening course since that time. The patient noted prior procedures on the knee to include  MCL reconstruction on the left knee(s).  Patient currently rates pain in the left knee(s) at 10 out of 10 with activity. Patient has night pain, worsening of pain with activity and weight bearing, pain that interferes with activities of daily living, pain with passive range of motion, crepitus and joint swelling.  Patient has evidence of subchondral cysts, subchondral sclerosis, periarticular osteophytes, joint subluxation, joint space narrowing and retained HW by imaging studies. There is no active infection.  Patient Active Problem List   Diagnosis Date Noted  . Obesity (BMI 30-39.9) 05/25/2014  . OSA (obstructive sleep apnea) 03/01/2014  . CAD (coronary artery disease) 10/17/2010  . HTN (hypertension) 10/17/2010  . Hyperlipidemia 10/17/2010  . Situational stress 10/17/2010   Past Medical History:  Diagnosis Date  . Anxiety   . 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 Has left to right collaterals. Last cath in 2009 with stent to to LAD. EF 45 to 50%  . Depression   . DJD (degenerative joint disease)   . Hyperlipidemia   . Hypertension   . 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 Obesity   . OSA (obstructive sleep apnea) 03/01/2014   Severe with AHI 72.5 events/hr; no  CPAP in use since weight loss   . Sleep apnea   . Stress     Past Surgical History:  Procedure Laterality Date  . ANTERIOR CRUCIATE LIGAMENT REPAIR Right 1990  . CARDIAC CATHETERIZATION  10/09/2007   EF 45-50%  . CARDIOVASCULAR STRESS TEST  11/23/2008   EF 55%  . CORONARY STENT PLACEMENT     RIGHT CORONARY THAT SUBSEQUENTLY OCCLUDED.  [name] CORONARY STENT PLACEMENT     LAD WITH A 3.0X15MM PROMUS DRUG-ELUTING STENT PLACED.  [name] KNEE ARTHROSCOPY Left 1982  . US ECHOCARDIOGRAPHY  03/14/2003   EF 55-60%    Current Outpatient Medications  Medication Sig Dispense Refill Last Dose  . aspirin EC 325 MG tablet Take 325 mg by mouth daily.  1   . hydrochlorothiazide (HYDRODIURIL) 25 MG tablet TAKE 1 TABLET (25 MG TOTAL) BY MOUTH DAILY. (Patient taking differently: Take 25 mg by mouth daily. ) 90 tablet 2   . lisinopril (PRINIVIL,ZESTRIL) 20 MG tablet TAKE 1 TABLET BY MOUTH EVERY DAY 30 tablet 5   . metoprolol tartrate (LOPRESSOR) 25 MG tablet Take 1 tablet (25 mg total) by mouth 2 (two) times daily. 60 tablet 2 Taking  . OVER THE COUNTER MEDICATION Apply 1 application topically 2 (two) times daily as needed (for pain.). [place] ([place], [place], [place], [place], and [place])     . pravastatin (PRAVACHOL) 40 MG tablet Take 40 mg by mouth daily.  1    No current facility-administered medications for this visit.    No Known Allergies  Social History   Tobacco Use  .  Smoking status: Never Smoker  . Smokeless tobacco: Never Used  Substance Use Topics  . Alcohol use: Yes    Comment: occ    Family History  Problem Relation Age of Onset  . Hypertension Mother   . Lung cancer Father      Review of Systems  Constitutional: Negative.   HENT: Negative.   Eyes: Negative.   Respiratory: Negative.   Cardiovascular: Negative.   Gastrointestinal: Negative.   Genitourinary: Negative.   Musculoskeletal: Positive for joint pain.  Skin: Negative.    Neurological: Negative.   Endo/Heme/Allergies: Negative.   Psychiatric/Behavioral: Negative.     Objective:  Physical Exam  Vitals reviewed. Constitutional: He is oriented to person, place, and time. He appears well-developed and well-nourished.  HENT:  Head: Normocephalic and atraumatic.  Eyes: Pupils are equal, round, and reactive to light. Conjunctivae and EOM are normal.  Neck: Normal range of motion. Neck supple.  Cardiovascular: Normal rate, regular rhythm, normal heart sounds and intact distal pulses.  Respiratory: Effort normal and breath sounds normal. No respiratory distress.  GI: Soft. Bowel sounds are normal. He exhibits no distension.  Genitourinary:  Genitourinary Comments: deferred  Musculoskeletal:       Left knee: He exhibits decreased range of motion, swelling and abnormal alignment. Tenderness found. Medial joint line and lateral joint line tenderness noted.  Neurological: He is alert and oriented to person, place, and time. He has normal reflexes.  Skin: Skin is warm and dry.  Psychiatric: He has a normal mood and affect. His behavior is normal. Judgment and thought content normal.    Vital signs in last 24 hours: @VSRANGES @  Labs:   Estimated body mass index is 34.8 kg/m as calculated from the following:   Height as of 10/16/17: 5' 10.5" (1.791 m).   Weight as of 10/16/17: 111.6 kg.   Imaging Review Plain radiographs demonstrate severe degenerative joint disease of the left knee(s). The overall alignment issignificant varus. The bone quality appears to be adequate for age and reported activity level.   Preoperative templating of the joint replacement has been completed, documented, and submitted to the Operating Room personnel in order to optimize intra-operative equipment management.   Anticipated LOS equal to or greater than 2 midnights due to - Age 8 and older with one or more of the following:  - Obesity  - Expected need for hospital services  (PT, OT, Nursing) required for safe  discharge  - Anticipated need for postoperative skilled nursing care or inpatient rehab  - Active co-morbidities: Respiratory Failure/COPD OR   - Unanticipated findings during/Post Surgery: None  - Patient is a high risk of re-admission due to: None     Assessment/Plan:  End stage arthritis, left knee   The patient history, physical examination, clinical judgment of the provider and imaging studies are consistent with end stage degenerative joint disease of the left knee(s) and total knee arthroplasty is deemed medically necessary. The treatment options including medical management, injection therapy arthroscopy and arthroplasty were discussed at length. The risks and benefits of total knee arthroplasty were presented and reviewed. The risks due to aseptic loosening, infection, stiffness, patella tracking problems, thromboembolic complications and other imponderables were discussed. The patient acknowledged the explanation, agreed to proceed with the plan and consent was signed. Patient is being admitted for inpatient treatment for surgery, pain control, PT, OT, prophylactic antibiotics, VTE prophylaxis, progressive ambulation and ADL's and discharge planning. The patient is planning to be discharged home with outpatient PT

## 2017-10-23 ENCOUNTER — Other Ambulatory Visit: Payer: Self-pay

## 2017-10-23 ENCOUNTER — Inpatient Hospital Stay (HOSPITAL_COMMUNITY)
Admission: RE | Admit: 2017-10-23 | Discharge: 2017-10-24 | DRG: 470 | Disposition: A | Discharge status: Home / Self Care | Payer: 59 | Source: Ambulatory Visit | Attending: Orthopedic Surgery | Admitting: Orthopedic Surgery

## 2017-10-23 ENCOUNTER — Encounter (HOSPITAL_COMMUNITY)
Admission: RE | Disposition: A | Discharge status: Home / Self Care | Payer: Self-pay | Source: Ambulatory Visit | Attending: Orthopedic Surgery

## 2017-10-23 ENCOUNTER — Inpatient Hospital Stay (HOSPITAL_COMMUNITY): Payer: 59 | Admitting: Anesthesiology

## 2017-10-23 ENCOUNTER — Encounter (HOSPITAL_COMMUNITY): Payer: Self-pay | Admitting: *Deleted

## 2017-10-23 ENCOUNTER — Inpatient Hospital Stay (HOSPITAL_COMMUNITY): Payer: 59

## 2017-10-23 DIAGNOSIS — Z801 Family history of malignant neoplasm of trachea, bronchus and lung: Secondary | ICD-10-CM

## 2017-10-23 DIAGNOSIS — Z8249 Family history of ischemic heart disease and other diseases of the circulatory system: Secondary | ICD-10-CM

## 2017-10-23 DIAGNOSIS — E785 Hyperlipidemia, unspecified: Secondary | ICD-10-CM | POA: Diagnosis present

## 2017-10-23 DIAGNOSIS — G4733 Obstructive sleep apnea (adult) (pediatric): Secondary | ICD-10-CM | POA: Diagnosis present

## 2017-10-23 DIAGNOSIS — Z7982 Long term (current) use of aspirin: Secondary | ICD-10-CM | POA: Diagnosis not present

## 2017-10-23 DIAGNOSIS — M12562 Traumatic arthropathy, left knee: Principal | ICD-10-CM | POA: Diagnosis present

## 2017-10-23 DIAGNOSIS — Z6834 Body mass index (BMI) 34.0-34.9, adult: Secondary | ICD-10-CM | POA: Diagnosis not present

## 2017-10-23 DIAGNOSIS — E669 Obesity, unspecified: Secondary | ICD-10-CM | POA: Diagnosis present

## 2017-10-23 DIAGNOSIS — Z955 Presence of coronary angioplasty implant and graft: Secondary | ICD-10-CM

## 2017-10-23 DIAGNOSIS — I252 Old myocardial infarction: Secondary | ICD-10-CM | POA: Diagnosis not present

## 2017-10-23 DIAGNOSIS — Z96652 Presence of left artificial knee joint: Secondary | ICD-10-CM

## 2017-10-23 DIAGNOSIS — I10 Essential (primary) hypertension: Secondary | ICD-10-CM | POA: Diagnosis present

## 2017-10-23 DIAGNOSIS — I2510 Atherosclerotic heart disease of native coronary artery without angina pectoris: Secondary | ICD-10-CM | POA: Diagnosis present

## 2017-10-23 HISTORY — PX: KNEE ARTHROPLASTY: SHX992

## 2017-10-23 LAB — TYPE AND SCREEN
ABO/RH(D): O POS
ANTIBODY SCREEN: NEGATIVE

## 2017-10-23 SURGERY — ARTHROPLASTY, KNEE, TOTAL, USING IMAGELESS COMPUTER-ASSISTED NAVIGATION
Anesthesia: Spinal | Site: Knee | Laterality: Left

## 2017-10-23 MED ORDER — METOCLOPRAMIDE HCL 5 MG PO TABS: 5.0000 mg | ORAL_TABLET | Freq: Three times a day (TID) | ORAL | Status: DC | PRN

## 2017-10-23 MED ORDER — METHOCARBAMOL 500 MG PO TABS: 500.0000 mg | ORAL_TABLET | Freq: Four times a day (QID) | ORAL | Status: DC | PRN

## 2017-10-23 MED ORDER — POLYETHYLENE GLYCOL 3350 17 G PO PACK: 17.0000 g | PACK | Freq: Every day | ORAL | Status: DC | PRN

## 2017-10-23 MED ORDER — PROPOFOL 10 MG/ML IV BOLUS
INTRAVENOUS | Status: AC
  Filled 2017-10-23: qty 60

## 2017-10-23 MED ORDER — PRAVASTATIN SODIUM 20 MG PO TABS
40.0000 mg | ORAL_TABLET | Freq: Every day | ORAL | Status: DC
  Administered 2017-10-24: 40 mg via ORAL
  Filled 2017-10-23: qty 2

## 2017-10-23 MED ORDER — CEFAZOLIN SODIUM-DEXTROSE 2-4 GM/100ML-% IV SOLN
2.0000 g | INTRAVENOUS | Status: AC
  Administered 2017-10-23: 2 g via INTRAVENOUS
  Filled 2017-10-23: qty 100

## 2017-10-23 MED ORDER — DIPHENHYDRAMINE HCL 12.5 MG/5ML PO ELIX: 12.5000 mg | ORAL_SOLUTION | ORAL | Status: DC | PRN

## 2017-10-23 MED ORDER — ONDANSETRON HCL 4 MG/2ML IJ SOLN
INTRAMUSCULAR | Status: DC | PRN
  Administered 2017-10-23: 4 mg via INTRAVENOUS

## 2017-10-23 MED ORDER — DEXAMETHASONE SODIUM PHOSPHATE 10 MG/ML IJ SOLN
10.0000 mg | Freq: Once | INTRAMUSCULAR | Status: AC
  Administered 2017-10-24: 10 mg via INTRAVENOUS
  Filled 2017-10-23: qty 1

## 2017-10-23 MED ORDER — ALBUMIN HUMAN 5 % IV SOLN
INTRAVENOUS | Status: AC
  Filled 2017-10-23: qty 250

## 2017-10-23 MED ORDER — BUPIVACAINE HCL (PF) 0.25 % IJ SOLN
INTRAMUSCULAR | Status: AC
  Filled 2017-10-23: qty 30

## 2017-10-23 MED ORDER — CHLORHEXIDINE GLUCONATE 4 % EX LIQD: 60.0000 mL | Freq: Once | CUTANEOUS | Status: DC

## 2017-10-23 MED ORDER — PROPOFOL 10 MG/ML IV BOLUS
INTRAVENOUS | Status: AC
  Filled 2017-10-23: qty 20

## 2017-10-23 MED ORDER — SODIUM CHLORIDE 0.9 % IR SOLN
Status: DC | PRN
  Administered 2017-10-23: 4000 mL

## 2017-10-23 MED ORDER — TRANEXAMIC ACID 1000 MG/10ML IV SOLN
1000.0000 mg | INTRAVENOUS | Status: AC
  Administered 2017-10-23: 1000 mg via INTRAVENOUS
  Filled 2017-10-23: qty 10

## 2017-10-23 MED ORDER — ASPIRIN 81 MG PO CHEW
81.0000 mg | CHEWABLE_TABLET | Freq: Two times a day (BID) | ORAL | Status: DC
  Administered 2017-10-23 – 2017-10-24 (×2): 81 mg via ORAL
  Filled 2017-10-23 (×2): qty 1

## 2017-10-23 MED ORDER — METOCLOPRAMIDE HCL 5 MG/ML IJ SOLN: 5.0000 mg | Freq: Three times a day (TID) | INTRAMUSCULAR | Status: DC | PRN

## 2017-10-23 MED ORDER — HYDROCODONE-ACETAMINOPHEN 7.5-325 MG PO TABS
1.0000 | ORAL_TABLET | ORAL | Status: DC | PRN
  Administered 2017-10-23 (×2): 1 via ORAL
  Administered 2017-10-24: 2 via ORAL
  Filled 2017-10-23: qty 2
  Filled 2017-10-23 (×2): qty 1

## 2017-10-23 MED ORDER — SODIUM CHLORIDE 0.9 % IV SOLN
INTRAVENOUS | Status: DC | PRN
  Administered 2017-10-23: 100 ug/min via INTRAVENOUS

## 2017-10-23 MED ORDER — ONDANSETRON HCL 4 MG/2ML IJ SOLN
INTRAMUSCULAR | Status: AC
  Filled 2017-10-23: qty 2

## 2017-10-23 MED ORDER — HYDROCODONE-ACETAMINOPHEN 5-325 MG PO TABS: 1.0000 | ORAL_TABLET | ORAL | Status: DC | PRN

## 2017-10-23 MED ORDER — MENTHOL 3 MG MT LOZG: 1.0000 | LOZENGE | OROMUCOSAL | Status: DC | PRN

## 2017-10-23 MED ORDER — ONDANSETRON HCL 4 MG PO TABS: 4.0000 mg | ORAL_TABLET | Freq: Four times a day (QID) | ORAL | Status: DC | PRN

## 2017-10-23 MED ORDER — LIDOCAINE HCL URETHRAL/MUCOSAL 2 % EX GEL
CUTANEOUS | Status: AC
  Filled 2017-10-23: qty 10

## 2017-10-23 MED ORDER — DEXAMETHASONE SODIUM PHOSPHATE 10 MG/ML IJ SOLN
INTRAMUSCULAR | Status: DC | PRN
  Administered 2017-10-23: 10 mg via INTRAVENOUS

## 2017-10-23 MED ORDER — OXYCODONE HCL ER 10 MG PO T12A
10.0000 mg | EXTENDED_RELEASE_TABLET | Freq: Two times a day (BID) | ORAL | Status: DC
  Administered 2017-10-23 – 2017-10-24 (×2): 10 mg via ORAL
  Filled 2017-10-23 (×2): qty 1

## 2017-10-23 MED ORDER — PHENYLEPHRINE 40 MCG/ML (10ML) SYRINGE FOR IV PUSH (FOR BLOOD PRESSURE SUPPORT)
PREFILLED_SYRINGE | INTRAVENOUS | Status: AC
  Filled 2017-10-23: qty 10

## 2017-10-23 MED ORDER — TRANEXAMIC ACID 1000 MG/10ML IV SOLN
1000.0000 mg | Freq: Once | INTRAVENOUS | Status: AC
  Administered 2017-10-23: 1000 mg via INTRAVENOUS
  Filled 2017-10-23: qty 1000

## 2017-10-23 MED ORDER — MIDAZOLAM HCL 5 MG/5ML IJ SOLN
INTRAMUSCULAR | Status: DC | PRN
  Administered 2017-10-23: 2 mg via INTRAVENOUS

## 2017-10-23 MED ORDER — BUPIVACAINE IN DEXTROSE 0.75-8.25 % IT SOLN
INTRATHECAL | Status: DC | PRN
  Administered 2017-10-23: 2 mL via INTRATHECAL

## 2017-10-23 MED ORDER — MORPHINE SULFATE (PF) 2 MG/ML IV SOLN
0.5000 mg | INTRAVENOUS | Status: DC | PRN
  Administered 2017-10-23: 1 mg via INTRAVENOUS
  Filled 2017-10-23 (×2): qty 1

## 2017-10-23 MED ORDER — LISINOPRIL 20 MG PO TABS
20.0000 mg | ORAL_TABLET | Freq: Every day | ORAL | Status: DC
  Administered 2017-10-24: 20 mg via ORAL
  Filled 2017-10-23: qty 1

## 2017-10-23 MED ORDER — STERILE WATER FOR IRRIGATION IR SOLN
Status: DC | PRN
  Administered 2017-10-23: 2000 mL

## 2017-10-23 MED ORDER — ACETAMINOPHEN 10 MG/ML IV SOLN
1000.0000 mg | INTRAVENOUS | Status: AC
  Administered 2017-10-23: 1000 mg via INTRAVENOUS
  Filled 2017-10-23: qty 100

## 2017-10-23 MED ORDER — SODIUM CHLORIDE 0.9 % IV SOLN: INTRAVENOUS | Status: DC

## 2017-10-23 MED ORDER — DOCUSATE SODIUM 100 MG PO CAPS
100.0000 mg | ORAL_CAPSULE | Freq: Two times a day (BID) | ORAL | Status: DC
  Administered 2017-10-23: 100 mg via ORAL
  Filled 2017-10-23 (×2): qty 1

## 2017-10-23 MED ORDER — KETOROLAC TROMETHAMINE 30 MG/ML IJ SOLN
INTRAMUSCULAR | Status: DC | PRN
  Administered 2017-10-23: 30 mg

## 2017-10-23 MED ORDER — CEFAZOLIN SODIUM-DEXTROSE 2-4 GM/100ML-% IV SOLN
2.0000 g | Freq: Four times a day (QID) | INTRAVENOUS | Status: AC
  Administered 2017-10-23 (×2): 2 g via INTRAVENOUS
  Filled 2017-10-23 (×2): qty 100

## 2017-10-23 MED ORDER — PROMETHAZINE HCL 25 MG/ML IJ SOLN: 6.2500 mg | INTRAMUSCULAR | Status: DC | PRN

## 2017-10-23 MED ORDER — PHENOL 1.4 % MT LIQD
1.0000 | OROMUCOSAL | Status: DC | PRN
  Filled 2017-10-23: qty 177

## 2017-10-23 MED ORDER — HYDROMORPHONE HCL 1 MG/ML IJ SOLN: 0.2500 mg | INTRAMUSCULAR | Status: DC | PRN

## 2017-10-23 MED ORDER — BUPIVACAINE HCL (PF) 0.25 % IJ SOLN
INTRAMUSCULAR | Status: DC | PRN
  Administered 2017-10-23: 30 mL

## 2017-10-23 MED ORDER — DEXAMETHASONE SODIUM PHOSPHATE 10 MG/ML IJ SOLN
INTRAMUSCULAR | Status: AC
  Filled 2017-10-23: qty 1

## 2017-10-23 MED ORDER — KETOROLAC TROMETHAMINE 15 MG/ML IJ SOLN
15.0000 mg | Freq: Four times a day (QID) | INTRAMUSCULAR | Status: AC
  Administered 2017-10-23 – 2017-10-24 (×4): 15 mg via INTRAVENOUS
  Filled 2017-10-23 (×4): qty 1

## 2017-10-23 MED ORDER — PHENYLEPHRINE HCL 10 MG/ML IJ SOLN
INTRAMUSCULAR | Status: AC
  Filled 2017-10-23: qty 2

## 2017-10-23 MED ORDER — HYDROCHLOROTHIAZIDE 25 MG PO TABS
25.0000 mg | ORAL_TABLET | Freq: Every day | ORAL | Status: DC
  Administered 2017-10-24: 25 mg via ORAL
  Filled 2017-10-23: qty 1

## 2017-10-23 MED ORDER — ISOPROPYL ALCOHOL 70 % SOLN
Status: DC | PRN
  Administered 2017-10-23: 1 via TOPICAL

## 2017-10-23 MED ORDER — MIDAZOLAM HCL 2 MG/2ML IJ SOLN
INTRAMUSCULAR | Status: AC
  Filled 2017-10-23: qty 2

## 2017-10-23 MED ORDER — PROPOFOL 500 MG/50ML IV EMUL
INTRAVENOUS | Status: DC | PRN
  Administered 2017-10-23: 100 ug/kg/min via INTRAVENOUS

## 2017-10-23 MED ORDER — FENTANYL CITRATE (PF) 100 MCG/2ML IJ SOLN
50.0000 ug | Freq: Once | INTRAMUSCULAR | Status: AC
  Administered 2017-10-23: 100 ug via INTRAVENOUS
  Filled 2017-10-23: qty 2

## 2017-10-23 MED ORDER — LACTATED RINGERS IV SOLN
INTRAVENOUS | Status: DC
  Administered 2017-10-23 (×2): via INTRAVENOUS

## 2017-10-23 MED ORDER — METHOCARBAMOL 500 MG IVPB - SIMPLE MED
500.0000 mg | Freq: Four times a day (QID) | INTRAVENOUS | Status: DC | PRN
  Filled 2017-10-23: qty 50

## 2017-10-23 MED ORDER — KETOROLAC TROMETHAMINE 30 MG/ML IJ SOLN
INTRAMUSCULAR | Status: AC
  Filled 2017-10-23: qty 1

## 2017-10-23 MED ORDER — MEPERIDINE HCL 50 MG/ML IJ SOLN: 6.2500 mg | INTRAMUSCULAR | Status: DC | PRN

## 2017-10-23 MED ORDER — ALBUMIN HUMAN 5 % IV SOLN
INTRAVENOUS | Status: DC | PRN
  Administered 2017-10-23 (×2): via INTRAVENOUS

## 2017-10-23 MED ORDER — SODIUM CHLORIDE 0.9 % IJ SOLN
INTRAMUSCULAR | Status: AC
  Filled 2017-10-23: qty 10

## 2017-10-23 MED ORDER — POVIDONE-IODINE 10 % EX SWAB: 2.0000 "application " | Freq: Once | CUTANEOUS | Status: DC

## 2017-10-23 MED ORDER — PROPOFOL 10 MG/ML IV BOLUS
INTRAVENOUS | Status: DC | PRN
  Administered 2017-10-23: 20 mg via INTRAVENOUS
  Administered 2017-10-23: 40 mg via INTRAVENOUS

## 2017-10-23 MED ORDER — ALUM & MAG HYDROXIDE-SIMETH 200-200-20 MG/5ML PO SUSP: 30.0000 mL | ORAL | Status: DC | PRN

## 2017-10-23 MED ORDER — SODIUM CHLORIDE 0.9 % IJ SOLN
INTRAMUSCULAR | Status: DC | PRN
  Administered 2017-10-23: 29 mL

## 2017-10-23 MED ORDER — METOPROLOL TARTRATE 25 MG PO TABS
25.0000 mg | ORAL_TABLET | Freq: Two times a day (BID) | ORAL | Status: DC
  Administered 2017-10-23 – 2017-10-24 (×2): 25 mg via ORAL
  Filled 2017-10-23 (×2): qty 1

## 2017-10-23 MED ORDER — MIDAZOLAM HCL 2 MG/2ML IJ SOLN
1.0000 mg | Freq: Once | INTRAMUSCULAR | Status: AC
  Administered 2017-10-23: 2 mg via INTRAVENOUS
  Filled 2017-10-23: qty 2

## 2017-10-23 MED ORDER — ONDANSETRON HCL 4 MG/2ML IJ SOLN: 4.0000 mg | Freq: Four times a day (QID) | INTRAMUSCULAR | Status: DC | PRN

## 2017-10-23 MED ORDER — SODIUM CHLORIDE 0.9 % IV SOLN
INTRAVENOUS | Status: DC
  Administered 2017-10-23 – 2017-10-24 (×2): via INTRAVENOUS

## 2017-10-23 MED ORDER — SENNA 8.6 MG PO TABS
1.0000 | ORAL_TABLET | Freq: Two times a day (BID) | ORAL | Status: DC
  Administered 2017-10-23: 8.6 mg via ORAL
  Filled 2017-10-23 (×2): qty 1

## 2017-10-23 MED ORDER — ACETAMINOPHEN 325 MG PO TABS: 325.0000 mg | ORAL_TABLET | Freq: Four times a day (QID) | ORAL | Status: DC | PRN

## 2017-10-23 MED ORDER — PHENYLEPHRINE 40 MCG/ML (10ML) SYRINGE FOR IV PUSH (FOR BLOOD PRESSURE SUPPORT)
PREFILLED_SYRINGE | INTRAVENOUS | Status: DC | PRN
  Administered 2017-10-23 (×5): 80 ug via INTRAVENOUS

## 2017-10-23 MED ORDER — SODIUM CHLORIDE 0.9 % IR SOLN
Status: DC | PRN
  Administered 2017-10-23: 1000 mL

## 2017-10-23 MED ORDER — SODIUM CHLORIDE 0.9 % IJ SOLN
INTRAMUSCULAR | Status: AC
  Filled 2017-10-23: qty 50

## 2017-10-23 SURGICAL SUPPLY — 70 items
BAG ZIPLOCK 12X15 (MISCELLANEOUS) ×3 IMPLANT
BANDAGE ACE 4X5 VEL STRL LF (GAUZE/BANDAGES/DRESSINGS) ×3 IMPLANT
BANDAGE ACE 6X5 VEL STRL LF (GAUZE/BANDAGES/DRESSINGS) ×3 IMPLANT
BATTERY INSTRU NAVIGATION (MISCELLANEOUS) ×9 IMPLANT
BLADE SAW RECIPROCATING 77.5 (BLADE) ×3 IMPLANT
CHLORAPREP W/TINT 26ML (MISCELLANEOUS) ×6 IMPLANT
COMPONENT TRI CR RETAIN SZ6 LT (Orthopedic Implant) ×1 IMPLANT
COVER SURGICAL LIGHT HANDLE (MISCELLANEOUS) ×3 IMPLANT
CUFF TOURN SGL QUICK 34 (TOURNIQUET CUFF) ×2
CUFF TRNQT CYL 34X4X40X1 (TOURNIQUET CUFF) ×1 IMPLANT
DECANTER SPIKE VIAL GLASS SM (MISCELLANEOUS) ×6 IMPLANT
DERMABOND ADVANCED (GAUZE/BANDAGES/DRESSINGS) ×2
DERMABOND ADVANCED .7 DNX12 (GAUZE/BANDAGES/DRESSINGS) ×1 IMPLANT
DRAPE SHEET LG 3/4 BI-LAMINATE (DRAPES) ×6 IMPLANT
DRAPE U-SHAPE 47X51 STRL (DRAPES) ×3 IMPLANT
DRESSING AQUACEL AG SP 3.5X10 (GAUZE/BANDAGES/DRESSINGS) ×1 IMPLANT
DRSG AQUACEL AG ADV 3.5X10 (GAUZE/BANDAGES/DRESSINGS) ×3 IMPLANT
DRSG AQUACEL AG SP 3.5X10 (GAUZE/BANDAGES/DRESSINGS) ×3
DRSG TEGADERM 4X4.75 (GAUZE/BANDAGES/DRESSINGS) IMPLANT
ELECT BLADE TIP CTD 4 INCH (ELECTRODE) ×3 IMPLANT
ELECT REM PT RETURN 15FT ADLT (MISCELLANEOUS) ×3 IMPLANT
EVACUATOR 1/8 PVC DRAIN (DRAIN) IMPLANT
GAUZE SPONGE 4X4 12PLY STRL (GAUZE/BANDAGES/DRESSINGS) ×3 IMPLANT
GLOVE BIO SURGEON STRL SZ8.5 (GLOVE) ×6 IMPLANT
GLOVE BIOGEL M 7.0 STRL (GLOVE) ×3 IMPLANT
GLOVE BIOGEL M STRL SZ7.5 (GLOVE) ×3 IMPLANT
GLOVE BIOGEL PI IND STRL 7.0 (GLOVE) ×2 IMPLANT
GLOVE BIOGEL PI IND STRL 7.5 (GLOVE) ×6 IMPLANT
GLOVE BIOGEL PI IND STRL 8.5 (GLOVE) ×1 IMPLANT
GLOVE BIOGEL PI IND STRL 9 (GLOVE) ×1 IMPLANT
GLOVE BIOGEL PI INDICATOR 7.0 (GLOVE) ×4
GLOVE BIOGEL PI INDICATOR 7.5 (GLOVE) ×12
GLOVE BIOGEL PI INDICATOR 8.5 (GLOVE) ×2
GLOVE BIOGEL PI INDICATOR 9 (GLOVE) ×2
GLOVE SURG ORTHO 8.5 STRL (GLOVE) ×3 IMPLANT
GOWN SPEC L3 XXLG W/TWL (GOWN DISPOSABLE) ×6 IMPLANT
GOWN STRL REUS W/TWL XL LVL3 (GOWN DISPOSABLE) ×9 IMPLANT
HANDPIECE INTERPULSE COAX TIP (DISPOSABLE) ×2
HOOD PEEL AWAY FLYTE STAYCOOL (MISCELLANEOUS) ×9 IMPLANT
INSERT TIB TRI BEAR SZ 7 KNEE (Insert) ×3 IMPLANT
KNEE PATELLA ASYMMETRIC 10X35 (Knees) ×3 IMPLANT
KNEE TIBIAL COMPONENT SZ7 (Knees) ×3 IMPLANT
MARKER SKIN DUAL TIP RULER LAB (MISCELLANEOUS) ×3 IMPLANT
NEEDLE SPNL 18GX3.5 QUINCKE PK (NEEDLE) ×3 IMPLANT
PACK TOTAL KNEE CUSTOM (KITS) ×3 IMPLANT
PADDING CAST COTTON 6X4 STRL (CAST SUPPLIES) ×3 IMPLANT
POSITIONER SURGICAL ARM (MISCELLANEOUS) ×3 IMPLANT
SAW OSC TIP CART 19.5X105X1.3 (SAW) ×3 IMPLANT
SEALER BIPOLAR AQUA 6.0 (INSTRUMENTS) ×3 IMPLANT
SET HNDPC FAN SPRY TIP SCT (DISPOSABLE) ×1 IMPLANT
SET PAD KNEE POSITIONER (MISCELLANEOUS) ×3 IMPLANT
SPONGE DRAIN TRACH 4X4 STRL 2S (GAUZE/BANDAGES/DRESSINGS) IMPLANT
SPONGE LAP 18X18 RF (DISPOSABLE) IMPLANT
SUT MNCRL AB 3-0 PS2 18 (SUTURE) ×3 IMPLANT
SUT MON AB 2-0 CT1 36 (SUTURE) ×9 IMPLANT
SUT STRATAFIX PDO 1 14 VIOLET (SUTURE) ×2
SUT STRATFX PDO 1 14 VIOLET (SUTURE) ×1
SUT VIC AB 1 CT1 36 (SUTURE) ×6 IMPLANT
SUT VIC AB 1 CTX 36 (SUTURE) ×4
SUT VIC AB 1 CTX36XBRD ANBCTR (SUTURE) ×2 IMPLANT
SUT VIC AB 2-0 CT1 27 (SUTURE) ×2
SUT VIC AB 2-0 CT1 TAPERPNT 27 (SUTURE) ×1 IMPLANT
SUTURE STRATFX PDO 1 14 VIOLET (SUTURE) ×1 IMPLANT
SYR 50ML LL SCALE MARK (SYRINGE) ×3 IMPLANT
TOWER CARTRIDGE SMART MIX (DISPOSABLE) IMPLANT
TRAY FOLEY MTR SLVR 16FR STAT (SET/KITS/TRAYS/PACK) ×3 IMPLANT
TRIATH CRUCIATE RETAIN SZ6 KNE (Orthopedic Implant) ×3 IMPLANT
TRIATHLON X3 TIBIAL BEARING INSERT - CR (Knees) ×2 IMPLANT
WRAP KNEE MAXI GEL POST OP (GAUZE/BANDAGES/DRESSINGS) ×3 IMPLANT
YANKAUER SUCT BULB TIP 10FT TU (MISCELLANEOUS) ×3 IMPLANT

## 2017-10-23 NOTE — Discharge Instructions (Signed)
° °Dr. Candas Deemer °Total Joint Specialist °[facility] °[address] °[place], [place] [address] °[phone] ° °TOTAL KNEE REPLACEMENT POSTOPERATIVE DIRECTIONS ° ° ° °Knee Rehabilitation, Guidelines Following Surgery  °Results after knee surgery are often greatly improved when you follow the exercise, range of motion and muscle strengthening exercises prescribed by your doctor. Safety measures are also important to protect the knee from further injury. Any time any of these exercises cause you to have increased pain or swelling in your knee joint, decrease the amount until you are comfortable again and slowly increase them. If you have problems or questions, call your caregiver or physical therapist for advice.  ° °WEIGHT BEARING °Weight bearing as tolerated with assist device (walker, cane, etc) as directed, use it as long as suggested by your surgeon or therapist, typically at least 4-6 weeks. ° °HOME CARE INSTRUCTIONS  °Remove items at home which could result in a fall. This includes throw rugs or furniture in walking pathways.  °Continue medications as instructed at time of discharge. °You may have some home medications which will be placed on hold until you complete the course of blood thinner medication.  °You may start showering once you are discharged home but do not submerge the incision under water. Just pat the incision dry and apply a dry gauze dressing on daily. °Walk with walker as instructed.  °You may resume a sexual relationship in one month or when given the OK by your doctor.  °· Use walker as long as suggested by your caregivers. °· Avoid periods of inactivity such as sitting longer than an hour when not asleep. This helps prevent blood clots.  °You may put full weight on your legs and walk as much as is comfortable.  °You may return to work once you are cleared by your doctor.  °Do not drive a car for 6 weeks or until released by you surgeon.  °· Do not drive  while taking narcotics.  °Wear the elastic stockings for three weeks following surgery during the day but you may remove then at night. °Make sure you keep all of your appointments after your operation with all of your doctors and caregivers. You should call the office at the above phone number and make an appointment for approximately two weeks after the date of your surgery. °Do not remove your surgical dressing. The dressing is waterproof; you may take showers in 3 days, but do not take tub baths or submerge the dressing. °Please pick up a stool softener and laxative for home use as long as you are requiring pain medications. °· ICE to the affected knee every three hours for 30 minutes at a time and then as needed for pain and swelling.  Continue to use ice on the knee for pain and swelling from surgery. You may notice swelling that will progress down to the foot and ankle.  This is normal after surgery.  Elevate the leg when you are not up walking on it.   °It is important for you to complete the blood thinner medication as prescribed by your doctor. °· Continue to use the breathing machine which will help keep your temperature down.  It is common for your temperature to cycle up and down following surgery, especially at night when you are not up moving around and exerting yourself.  The breathing machine keeps your lungs expanded and your temperature down. ° °RANGE OF MOTION AND STRENGTHENING EXERCISES  °Rehabilitation of the knee is important following   a knee injury or an operation. After just a few days of immobilization, the muscles of the thigh which control the knee become weakened and shrink (atrophy). Knee exercises are designed to build up the tone and strength of the thigh muscles and to improve knee motion. Often times heat used for twenty to thirty minutes before working out will loosen up your tissues and help with improving the range of motion but do not use heat for the first two weeks following  surgery. These exercises can be done on a training (exercise) mat, on the floor, on a table or on a bed. Use what ever works the best and is most comfortable for you Knee exercises include:  °Leg Lifts - While your knee is still immobilized in a splint or cast, you can do straight leg raises. Lift the leg to 60 degrees, hold for 3 sec, and slowly lower the leg. Repeat 10-20 times 2-3 times daily. Perform this exercise against resistance later as your knee gets better.  °Quad and Hamstring Sets - Tighten up the muscle on the front of the thigh (Quad) and hold for 5-10 sec. Repeat this 10-20 times hourly. Hamstring sets are done by pushing the foot backward against an object and holding for 5-10 sec. Repeat as with quad sets.  °A rehabilitation program following serious knee injuries can speed recovery and prevent re-injury in the future due to weakened muscles. Contact your doctor or a physical therapist for more information on knee rehabilitation.  ° °SKILLED REHAB INSTRUCTIONS: °If the patient is transferred to a skilled rehab facility following release from the hospital, a list of the current medications will be sent to the facility for the patient to continue.  When discharged from the skilled rehab facility, please have the facility set up the patient's Home Health Physical Therapy prior to being released. Also, the skilled facility will be responsible for providing the patient with their medications at time of release from the facility to include their pain medication, the muscle relaxants, and their blood thinner medication. If the patient is still at the rehab facility at time of the two week follow up appointment, the skilled rehab facility will also need to assist the patient in arranging follow up appointment in our office and any transportation needs. ° °MAKE SURE YOU:  °Understand these instructions.  °Will watch your condition.  °Will get help right away if you are not doing well or get worse.   ° ° °Pick up stool softner and laxative for home use following surgery while on pain medications. °Do NOT remove your dressing. You may shower.  °Do not take tub baths or submerge incision under water. °May shower starting three days after surgery. °Please use a clean towel to pat the incision dry following showers. °Continue to use ice for pain and swelling after surgery. °Do not use any lotions or creams on the incision until instructed by your surgeon. ° °

## 2017-10-23 NOTE — Op Note (Signed)
OPERATIVE REPORT  SURGEON: Rod Can, MD   ASSISTANT: Nehemiah Massed, PA-C.  PREOPERATIVE DIAGNOSIS: Left knee arthritis.   POSTOPERATIVE DIAGNOSIS: Left knee arthritis.   PROCEDURE: Left total knee arthroplasty.   IMPLANTS: Stryker Triathlon CR femur, size 6. Stryker Tritanium tibia, size 7. X3 polyethelyene insert, size 11 mm, CR. 3 button asymmetric patella, size 35 mm.  ANESTHESIA:  Regional and Spinal  TOURNIQUET TIME: Not utilized.   ESTIMATED BLOOD LOSS:-1100 mL    ANTIBIOTICS: 2 g Ancef.  DRAINS: None.  COMPLICATIONS: None   CONDITION: PACU - hemodynamically stable.   BRIEF CLINICAL NOTE: [name] [name] is a 57 y.o. male with a long-standing history of Left knee arthritis. After failing conservative management, the patient was indicated for total knee arthroplasty. The risks, benefits, and alternatives to the procedure were explained, and the patient elected to proceed.  PROCEDURE IN DETAIL: Adductor canal block was obtained in the pre-op holding area. Once inside the operative room, spinal anesthesia was obtained, and a foley catheter was inserted. The patient was then positioned, a nonsterile tourniquet was placed, and the lower extremity was prepped and draped in the normal sterile surgical fashion. A time-out was called verifying side and site of surgery. The patient received IV antibiotics within 60 minutes of beginning the procedure. The tourniquet was not utilized.  An anterior approach to the knee was performed utilizing a medial parapatellar arthrotomy. A medial release was performed and the patellar fat pad was excised. Stryker navigation was used to cut the distal femur perpendicular to the mechanical axis. A freehand patellar resection was performed, and the patella was sized and prepared with 3 lug holes.  Nagivation was used to make a neutral  proximal tibia resection, taking 6 mm of bone from the less affected lateral side with 3 degrees of slope. The menisci were excised. A spacer block was placed, and the alignment and balance in extension were confirmed.   The distal femur was sized using the 3-degree external rotation guide referencing the posterior femoral cortex. The appropriate 4-in-1 cutting block was pinned into place. Rotation was checked using Whiteside's line, the epicondylar axis, and then confirmed with a spacer block in flexion. The remaining femoral cuts were performed, taking care to protect the MCL.  The tibia was sized and the trial tray was pinned into place. The remaining trail components were inserted. The knee was stable to varus and valgus stress through a full range of motion. The patella tracked centrally, and the PCL was well balanced. The trial components were removed, and the proximal tibial surface was prepared. Final components were impacted into place. The knee was tested for a final time and found to be well balanced.  The wound was copiously irrigated with normal saline with pulse lavage. Marcaine solution was injected into the periarticular soft tissue. The wound was closed in layers using #1 Vicryl and Stratafix for the fascia, 2-0 Vicryl for the subcutaneous fat, 2-0 Monocryl for the deep dermal layer, 3-0 running Monocryl subcuticular Stitch, and Dermabond for the skin. Once the glue was fully dried, an Aquacell Ag and compressive dressing were applied. Tthe patient was transported to the recovery room in stable condition. Sponge, needle, and instrument counts were correct at the end of the case x2. The patient tolerated the procedure well and there were no known complications.  Please note that a surgical assistant was a medical necessity for this procedure in order to perform it in a safe and expeditious manner. Surgical assistant was  necessary to retract the ligaments and vital neurovascular  structures to prevent injury to them and also necessary for proper positioning of the limb to allow for anatomic placement of the prosthesis.

## 2017-10-23 NOTE — Anesthesia Procedure Notes (Signed)
Date/Time: 10/23/2017 10:52 AM Performed by: Sharlette Dense, CRNA Oxygen Delivery Method: Simple face mask

## 2017-10-23 NOTE — Evaluation (Signed)
Physical Therapy Evaluation Patient Details Name: [name] [name] MRN: [id number] DOB: 01/15/1961 Today's Date: 10/23/2017   History of Present Illness  Pt is a 57 YO male s/p L TKR on 10/23/17. PMH includes obesity, OSA, CAD, HTN, HLD, anxiety, depression, DJD, MI. Surgical history includes R ACL repair, cardiac cath, coronary stent placement.   Clinical Impression   Pt s/p K TKR. Pt with L knee pain, difficulty performing mobility tasks, and decreased endurance and tolerance for ambulation. Pt to benefit from acute PT to address deficits. Pt ambulated 40 ft with RW with min guard assist today, and exhibited no buckling. PT to progress mobility and follow acutely.     Follow Up Recommendations Follow surgeon's recommendation for DC plan and follow-up therapies(OPPT )    Equipment Recommendations  Rolling walker with 5" wheels    Recommendations for Other Services       Precautions / Restrictions Precautions Precautions: Fall Restrictions Weight Bearing Restrictions: No Other Position/Activity Restrictions: WBAT       Mobility  Bed Mobility Overal bed mobility: Needs Assistance Bed Mobility: Supine to Sit     Supine to sit: Min guard     General bed mobility comments: Min guard for safety. Increased time and effort.   Transfers Overall transfer level: Needs assistance Equipment used: Rolling walker (2 wheeled) Transfers: Sit to/from Stand Sit to Stand: Min guard         General transfer comment: Min guard for safety. Verbal cuing for hand placement, increased time to perform.   Ambulation/Gait Ambulation/Gait assistance: Min guard Gait Distance (Feet): 40 Feet Assistive device: Rolling walker (2 wheeled) Gait Pattern/deviations: Step-to pattern;Decreased stride length;Decreased weight shift to left;Decreased stance time - left;Antalgic Gait velocity: decr   General Gait Details: Min guard for safety, initial L knee guarding for first three gait cycles, but no  buckling present. Verbal cuing for sequencing.   Stairs            Wheelchair Mobility    Modified Rankin (Stroke Patients Only)       Balance Overall balance assessment: Mild deficits observed, not formally tested                                           Pertinent Vitals/Pain Pain Assessment: 0-10 Pain Score: 7  Pain Descriptors / Indicators: Aching;Throbbing Pain Intervention(s): Limited activity within patient's tolerance;Repositioned;Ice applied;Monitored during session;Premedicated before session    Home Living Family/patient expects to be discharged to:: Private residence Living Arrangements: Spouse/significant other Available Help at Discharge: Family;Available PRN/intermittently Type of Home: House Home Access: Stairs to enter Entrance Stairs-Rails: Right;Left;Can reach both Entrance Stairs-Number of Steps: 6 Home Layout: Two level;Able to live on main level with bedroom/bathroom Home Equipment: Bedside commode;Shower seat;Walker - standard;Cane - single point      Prior Function Level of Independence: Independent               Hand Dominance   Dominant Hand: Right    Extremity/Trunk Assessment   Upper Extremity Assessment Upper Extremity Assessment: Overall WFL for tasks assessed    Lower Extremity Assessment Lower Extremity Assessment: Overall WFL for tasks assessed;LLE deficits/detail LLE Deficits / Details: Suspected LLE weakness secondary to surgery; able to perform 3 SLR, 5 quad sets, ankle pumps  LLE Sensation: WNL    Cervical / Trunk Assessment Cervical / Trunk Assessment: Normal  Communication   Communication:  No difficulties  Cognition Arousal/Alertness: Awake/alert Behavior During Therapy: WFL for tasks assessed/performed;Anxious Overall Cognitive Status: Within Functional Limits for tasks assessed                                        General Comments      Exercises Total Joint  Exercises Ankle Circles/Pumps: AROM;Both;10 reps;Supine Quad Sets: AROM;Left;5 reps;Supine Heel Slides: AAROM;Left;Supine(3 reps ) Goniometric ROM: 10-60 degrees, limited by pain    Assessment/Plan    PT Assessment Patient needs continued PT services  PT Problem List Decreased strength;Pain;Decreased range of motion;Decreased activity tolerance;Decreased knowledge of use of DME;Decreased balance;Decreased mobility       PT Treatment Interventions DME instruction;Therapeutic activities;Gait training;Therapeutic exercise;Patient/family education;Stair training;Balance training;Functional mobility training    PT Goals (Current goals can be found in the Care Plan section)  Acute Rehab PT Goals PT Goal Formulation: With patient Time For Goal Achievement: 11/06/17 Potential to Achieve Goals: Good    Frequency 7X/week   Barriers to discharge        Co-evaluation               AM-PAC PT "6 Clicks" Daily Activity  Outcome Measure Difficulty turning over in bed (including adjusting bedclothes, sheets and blankets)?: Unable Difficulty moving from lying on back to sitting on the side of the bed? : Unable Difficulty sitting down on and standing up from a chair with arms (e.g., wheelchair, bedside commode, etc,.)?: Unable Help needed moving to and from a bed to chair (including a wheelchair)?: A Little Help needed walking in hospital room?: A Little Help needed climbing 3-5 steps with a railing? : A Little 6 Click Score: 12    End of Session Equipment Utilized During Treatment: Gait belt Activity Tolerance: Patient tolerated treatment well;Patient limited by pain Patient left: in chair;with chair alarm set;with call bell/phone within reach;with family/visitor present;with SCD's reapplied Nurse Communication: Mobility status PT Visit Diagnosis: Other abnormalities of gait and mobility (R26.89);Difficulty in walking, not elsewhere classified (R26.2)    Time: 2248-2500 PT Time  Calculation (min) (ACUTE ONLY): 35 min   Charges:   PT Evaluation $PT Eval Low Complexity: 1 Low PT Treatments $Gait Training: 8-22 mins        Jaxon Mynhier Conception Chancy, PT, DPT  Pager # [phone]   Danell Verno D Patty Lopezgarcia 10/23/2017, 7:51 PM

## 2017-10-23 NOTE — Interval H&P Note (Signed)
History and Physical Interval Note:  10/23/2017 9:38 AM  [name] [name]  has presented today for surgery, with the diagnosis of Degenerative joint disease left knee  The various methods of treatment have been discussed with the patient and family. After consideration of risks, benefits and other options for treatment, the patient has consented to  Procedure(s) with comments: LEFT TOTAL KNEE ARTHROPLASTY WITH COMPUTER NAVIGATION (Left) - NEEDS RNFA as a surgical intervention .  The patient's history has been reviewed, patient examined, no change in status, stable for surgery.  I have reviewed the patient's chart and labs.  Questions were answered to the patient's satisfaction.    The risks, benefits, and alternatives were discussed with the patient. There are risks associated with the surgery including, but not limited to, problems with anesthesia (death), infection, instability (giving out of the joint), dislocation, differences in leg length/angulation/rotation, fracture of bones, loosening or failure of implants, hematoma (blood accumulation) which may require surgical drainage, blood clots, pulmonary embolism, nerve injury (foot drop and lateral thigh numbness), and blood vessel injury. The patient understands these risks and elects to proceed.   Hilton Cork Moselle Rister

## 2017-10-23 NOTE — Progress Notes (Signed)
AssistedDr. Lissa Hoard with left, interscalene , adductor canal block. Side rails up, monitors on throughout procedure. See vital signs in flow sheet. Tolerated Procedure well.

## 2017-10-23 NOTE — Transfer of Care (Signed)
Immediate Anesthesia Transfer of Care Note  Patient: [name] [name]  Procedure(s) Performed: LEFT TOTAL KNEE ARTHROPLASTY WITH COMPUTER NAVIGATION (Left Knee)  Patient Location: PACU  Anesthesia Type:Spinal  Level of Consciousness: awake and alert   Airway & Oxygen Therapy: Patient Spontanous Breathing and Patient connected to face mask oxygen  Post-op Assessment: Report given to RN and Post -op Vital signs reviewed and stable  Post vital signs: Reviewed and stable  Last Vitals:  Vitals Value Taken Time  BP 120/83 10/23/2017  2:10 PM  Temp    Pulse 66 10/23/2017  2:14 PM  Resp 20 10/23/2017  2:14 PM  SpO2 100 % 10/23/2017  2:14 PM  Vitals shown include unvalidated device data.  Last Pain:  Vitals:   10/23/17 0800  TempSrc: Oral         Complications: No apparent anesthesia complications

## 2017-10-23 NOTE — Anesthesia Procedure Notes (Signed)
Spinal  Patient location during procedure: OR Staffing Anesthesiologist: Nolon Nations, MD Performed: anesthesiologist  Preanesthetic Checklist Completed: patient identified, site marked, surgical consent, pre-op evaluation, timeout performed, IV checked, risks and benefits discussed and monitors and equipment checked Spinal Block Patient position: sitting Prep: site prepped and draped and DuraPrep Patient monitoring: heart rate, continuous pulse ox and blood pressure Approach: left paramedian Location: L3-4 Injection technique: single-shot Needle Needle type: Sprotte  Needle gauge: 24 G Needle length: 9 cm Additional Notes Expiration date of kit checked and confirmed. Patient tolerated procedure well, without complications.

## 2017-10-23 NOTE — Anesthesia Procedure Notes (Signed)
Anesthesia Regional Block: Adductor canal block   Pre-Anesthetic Checklist: ,, timeout performed, Correct Patient, Correct Site, Correct Laterality, Correct Procedure, Correct Position, site marked, Risks and benefits discussed,  Surgical consent,  Pre-op evaluation,  At surgeon's request and post-op pain management  Laterality: Left  Prep: chloraprep       Needles:  Injection technique: Single-shot  Needle Type: Stimiplex     Needle Length: 9cm  Needle Gauge: 21     Additional Needles:   Procedures:,,,, ultrasound used (permanent image in chart),,,,  Narrative:  Start time: 10/23/2017 10:20 AM End time: 10/23/2017 10:22 AM Injection made incrementally with aspirations every 5 mL.  Performed by: Personally  Anesthesiologist: Nolon Nations, MD  Additional Notes: BP cuff, EKG monitors applied. Sedation begun. Artery and nerve location verified with U/S and anesthetic injected incrementally, slowly, and after negative aspirations under direct u/s guidance. Good fascial /perineural spread. Tolerated well.

## 2017-10-23 NOTE — Anesthesia Preprocedure Evaluation (Signed)
Anesthesia Evaluation  Patient identified by MRN, date of birth, ID band Patient awake    Reviewed: Allergy & Precautions, NPO status , Patient's Chart, lab work & pertinent test results, reviewed documented beta blocker date and time   Airway Mallampati: II  TM Distance: >3 FB Neck ROM: Full    Dental no notable dental hx.    Pulmonary sleep apnea ,    Pulmonary exam normal breath sounds clear to auscultation       Cardiovascular hypertension, Pt. on medications and Pt. on home beta blockers + CAD and + Past MI  Normal cardiovascular exam Rhythm:Regular Rate:Normal  Echo 11/2013 - Left ventricle: The cavity size was normal. Systolic function wasnormal. The estimated ejection fraction was in the range of 50%to 55%. Wall motion was normal; there were no regional wallmotion abnormalities. Doppler parameters are consistent withabnormal left ventricular relaxation (grade 1 diastolicdysfunction). There was no evidence of elevated ventricularfilling pressure by Doppler parameters. - Aortic valve: Trileaflet; normal thickness leaflets. There was noregurgitation. - Mitral valve: There was no regurgitation. - Right ventricle: Systolic function was normal. - Right atrium: The atrium was normal in size. - Tricuspid valve: There was no regurgitation. - Pulmonic valve: There was trivial regurgitation. - Pulmonary arteries: Systolic pressure couldn&'t be assessed. - Pericardium, extracardiac: There was no pericardial effusion.  Impressions: - Normal biventricular size and systolic function. Abnormal relaxation with normal filling pressures. No significant valvularabnormality.   Neuro/Psych PSYCHIATRIC DISORDERS Anxiety Depression negative neurological ROS     GI/Hepatic negative GI ROS, Neg liver ROS,   Endo/Other  negative endocrine ROS  Renal/GU negative Renal ROS     Musculoskeletal  (+) Arthritis ,   Abdominal    Peds  Hematology negative hematology ROS (+)   Anesthesia Other Findings   Reproductive/Obstetrics negative OB ROS                             Anesthesia Physical Anesthesia Plan  ASA: III  Anesthesia Plan: Spinal   Post-op Pain Management:  Regional for Post-op pain   Induction: Intravenous  PONV Risk Score and Plan:   Airway Management Planned:   Additional Equipment:   Intra-op Plan:   Post-operative Plan: Extubation in OR  Informed Consent: I have reviewed the patients History and Physical, chart, labs and discussed the procedure including the risks, benefits and alternatives for the proposed anesthesia with the patient or authorized representative who has indicated his/her understanding and acceptance.   Dental advisory given  Plan Discussed with: CRNA  Anesthesia Plan Comments:         Anesthesia Quick Evaluation

## 2017-10-24 ENCOUNTER — Encounter (HOSPITAL_COMMUNITY): Payer: Self-pay | Admitting: Orthopedic Surgery

## 2017-10-24 LAB — CBC
HEMATOCRIT: 36.1 % — AB (ref 39.0–52.0)
HEMOGLOBIN: 12.5 g/dL — AB (ref 13.0–17.0)
MCH: 31.5 pg (ref 26.0–34.0)
MCHC: 34.6 g/dL (ref 30.0–36.0)
MCV: 90.9 fL (ref 78.0–100.0)
Platelets: 176 10*3/uL (ref 150–400)
RBC: 3.97 MIL/uL — ABNORMAL LOW (ref 4.22–5.81)
RDW: 13.6 % (ref 11.5–15.5)
WBC: 14.6 10*3/uL — ABNORMAL HIGH (ref 4.0–10.5)

## 2017-10-24 LAB — BASIC METABOLIC PANEL
Anion gap: 8 (ref 5–15)
BUN: 20 mg/dL (ref 6–20)
CALCIUM: 8.5 mg/dL — AB (ref 8.9–10.3)
CO2: 24 mmol/L (ref 22–32)
CREATININE: 1.11 mg/dL (ref 0.61–1.24)
Chloride: 107 mmol/L (ref 98–111)
Glucose, Bld: 159 mg/dL — ABNORMAL HIGH (ref 70–99)
Potassium: 4.4 mmol/L (ref 3.5–5.1)
SODIUM: 139 mmol/L (ref 135–145)

## 2017-10-24 MED ORDER — DOCUSATE SODIUM 100 MG PO CAPS: 100.0000 mg | ORAL_CAPSULE | Freq: Two times a day (BID) | ORAL | 1 refills | Status: DC

## 2017-10-24 MED ORDER — OXYCODONE HCL ER 10 MG PO T12A: 10.0000 mg | EXTENDED_RELEASE_TABLET | ORAL | 0 refills | Status: DC

## 2017-10-24 MED ORDER — SENNA 8.6 MG PO TABS: 1.0000 | ORAL_TABLET | Freq: Two times a day (BID) | ORAL | 0 refills | Status: DC

## 2017-10-24 MED ORDER — HYDROCODONE-ACETAMINOPHEN 5-325 MG PO TABS: 1.0000 | ORAL_TABLET | Freq: Four times a day (QID) | ORAL | 0 refills | Status: DC | PRN

## 2017-10-24 MED ORDER — ONDANSETRON HCL 4 MG PO TABS: 4.0000 mg | ORAL_TABLET | Freq: Four times a day (QID) | ORAL | 0 refills | Status: DC | PRN

## 2017-10-24 MED ORDER — ASPIRIN 81 MG PO CHEW: 81.0000 mg | CHEWABLE_TABLET | Freq: Two times a day (BID) | ORAL | 1 refills | Status: DC

## 2017-10-24 NOTE — Progress Notes (Signed)
Physical Therapy Treatment Patient Details Name: [name] [name] MRN: [id number] DOB: Dec 21, 1960 Today's Date: 10/24/2017    History of Present Illness Pt is a 57 YO male s/p L TKR on 10/23/17. PMH includes obesity, OSA, CAD, HTN, HLD, anxiety, depression, DJD, MI. Surgical history includes R ACL repair, cardiac cath, coronary stent placement.     PT Comments    POD # 1 am session Assisted with amb a greater distance and performeds all supine TKR TE's following HEP handout.  Instructed on proper tech, freq as well as use of ICE.   Follow Up Recommendations  Follow surgeon's recommendation for DC plan and follow-up therapies     Equipment Recommendations  Rolling walker with 5" wheels    Recommendations for Other Services       Precautions / Restrictions Precautions Precautions: Fall Restrictions Weight Bearing Restrictions: No Other Position/Activity Restrictions: WBAT     Mobility  Bed Mobility               General bed mobility comments: OOB in recliner  Transfers Overall transfer level: Needs assistance Equipment used: Rolling walker (2 wheeled) Transfers: Sit to/from Stand Sit to Stand: Min guard         General transfer comment: Min guard for safety. Verbal cuing for hand placement, increased time to perform.   Ambulation/Gait Ambulation/Gait assistance: Min guard;Supervision Gait Distance (Feet): 85 Feet   Gait Pattern/deviations: Step-to pattern;Decreased stride length;Decreased weight shift to left;Decreased stance time - left;Antalgic Gait velocity: decreased   General Gait Details: <255 VC's safety with turns and hand placement with stand to sit   Stairs             Wheelchair Mobility    Modified Rankin (Stroke Patients Only)       Balance                                            Cognition Arousal/Alertness: Awake/alert Behavior During Therapy: WFL for tasks assessed/performed Overall Cognitive Status:  Within Functional Limits for tasks assessed                                        Exercises   Total Knee Replacement TE's 10 reps B LE ankle pumps 10 reps towel squeezes 10 reps knee presses 10 reps heel slides  10 reps SAQ's 10 reps SLR's 10 reps ABD Followed by ICE     General Comments        Pertinent Vitals/Pain Pain Assessment: 0-10 Pain Score: 4  Pain Location: L knee Pain Descriptors / Indicators: Operative site guarding;Discomfort Pain Intervention(s): Monitored during session;Premedicated before session;Repositioned;Ice applied    Home Living                      Prior Function            PT Goals (current goals can now be found in the care plan section) Progress towards PT goals: Progressing toward goals    Frequency    7X/week      PT Plan Current plan remains appropriate    Co-evaluation              AM-PAC PT "6 Clicks" Daily Activity  Outcome Measure  Difficulty turning over in bed (including adjusting  bedclothes, sheets and blankets)?: A Lot Difficulty moving from lying on back to sitting on the side of the bed? : A Lot Difficulty sitting down on and standing up from a chair with arms (e.g., wheelchair, bedside commode, etc,.)?: A Lot Help needed moving to and from a bed to chair (including a wheelchair)?: A Little Help needed walking in hospital room?: A Little Help needed climbing 3-5 steps with a railing? : A Lot 6 Click Score: 14    End of Session Equipment Utilized During Treatment: Gait belt Activity Tolerance: Patient tolerated treatment well;Patient limited by pain Patient left: in chair;with chair alarm set;with call bell/phone within reach;with family/visitor present;with SCD's reapplied Nurse Communication: Mobility status PT Visit Diagnosis: Other abnormalities of gait and mobility (R26.89);Difficulty in walking, not elsewhere classified (R26.2)     Time: 6808-8110 PT Time Calculation (min)  (ACUTE ONLY): 23 min  Charges:  $Gait Training: 8-22 mins $Therapeutic Exercise: 8-22 mins                     Rica Koyanagi  PTA [facility]  Acute  Rehab Pager      [phone]

## 2017-10-24 NOTE — Discharge Summary (Signed)
Physician Discharge Summary  Patient ID: [name] [name] MRN: [id number] DOB/AGE: Aug 25, 1960 57 y.o.  Admit date: 10/23/2017 Discharge date: 10/24/2017  Admission Diagnoses:  Traumatic arthritis of knee, left  Discharge Diagnoses:  Principal Problem:   Traumatic arthritis of knee, left Active Problems:   Traumatic arthritis of left knee   Past Medical History:  Diagnosis Date  . Anxiety   . 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 Has left to right collaterals. Last cath in 2009 with stent to to LAD. EF 45 to 50%  . Depression   . DJD (degenerative joint disease)   . Hyperlipidemia   . Hypertension   . 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 Obesity   . OSA (obstructive sleep apnea) 03/01/2014   Severe with AHI 72.5 events/hr; no CPAP in use since weight loss   . Sleep apnea   . Stress     Surgeries: Procedure(s): LEFT TOTAL KNEE ARTHROPLASTY WITH COMPUTER NAVIGATION on 10/23/2017   Consultants (if any):   Discharged Condition: Improved  Hospital Course: [name] [name] is an 57 y.o. male who was admitted 10/23/2017 with a diagnosis of Traumatic arthritis of knee, left and went to the operating room on 10/23/2017 and underwent the above named procedures.    He was given perioperative antibiotics:  Anti-infectives (From admission, onward)   Start     Dose/Rate Route Frequency Ordered Stop   10/23/17 1700  ceFAZolin (ANCEF) IVPB 2g/100 mL premix     2 g 200 mL/hr over 30 Minutes Intravenous Every 6 hours 10/23/17 1508 10/23/17 2249   10/23/17 0800  ceFAZolin (ANCEF) IVPB 2g/100 mL premix     2 g 200 mL/hr over 30 Minutes Intravenous On call to O.R. 10/23/17 2542 10/23/17 1109    .  He was given sequential compression devices, early ambulation, and ASA for DVT prophylaxis.  He benefited maximally from the hospital stay and there were no complications.    Recent vital signs:  Vitals:   10/24/17 0221 10/24/17 0709   BP: 104/68 110/70  Pulse: 65 69  Resp: 17 17  Temp: 98.2 F (36.8 C) 97.7 F (36.5 C)  SpO2: 99% 97%    Recent laboratory studies:  Lab Results  Component Value Date   HGB 12.5 (L) 10/24/2017   HGB 16.7 10/16/2017   HGB 17.0 12/16/2013   Lab Results  Component Value Date   WBC 14.6 (H) 10/24/2017   PLT 176 10/24/2017   No results found for: INR Lab Results  Component Value Date   NA 139 10/24/2017   K 4.4 10/24/2017   CL 107 10/24/2017   CO2 24 10/24/2017   BUN 20 10/24/2017   CREATININE 1.11 10/24/2017   GLUCOSE 159 (H) 10/24/2017    Discharge Medications:   Allergies as of 10/24/2017   No Known Allergies     Medication List    STOP taking these medications   aspirin EC 325 MG tablet Replaced by:  aspirin 81 MG chewable tablet   OVER THE COUNTER MEDICATION     TAKE these medications   aspirin 81 MG chewable tablet Chew 1 tablet (81 mg total) by mouth 2 (two) times daily. Replaces:  aspirin EC 325 MG tablet   docusate sodium 100 MG capsule Commonly known as:  COLACE Take 1 capsule (100 mg total) by mouth 2 (two) times daily.   hydrochlorothiazide 25 MG tablet Commonly known as:  HYDRODIURIL TAKE 1 TABLET (25 MG TOTAL) BY MOUTH DAILY. What changed:  See the new instructions.   HYDROcodone-acetaminophen 5-325 MG tablet Commonly known as:  NORCO/VICODIN Take 1-2 tablets by mouth every 6 (six) hours as needed (prn postop knee pain).   lisinopril 20 MG tablet Commonly known as:  PRINIVIL,ZESTRIL TAKE 1 TABLET BY MOUTH EVERY DAY   metoprolol tartrate 25 MG tablet Commonly known as:  LOPRESSOR Take 1 tablet (25 mg total) by mouth 2 (two) times daily.   ondansetron 4 MG tablet Commonly known as:  ZOFRAN Take 1 tablet (4 mg total) by mouth every 6 (six) hours as needed for nausea.   oxyCODONE 10 mg 12 hr tablet Commonly known as:  OXYCONTIN Take 1 tablet (10 mg total) by mouth PRO. 1 tab PO every 12 hours for 3 days, then 1 tab PO daily for 4  days   pravastatin 40 MG tablet Commonly known as:  PRAVACHOL Take 40 mg by mouth daily.   senna 8.6 MG Tabs tablet Commonly known as:  SENOKOT Take 1 tablet (8.6 mg total) by mouth 2 (two) times daily.       Diagnostic Studies: Dg Knee Left Port  Result Date: 10/23/2017 CLINICAL DATA:  Status post total left knee replacement. EXAM: PORTABLE LEFT KNEE - 1-2 VIEW COMPARISON:  None. FINDINGS: Total left knee replacement is identified without malalignment. Postsurgical change including joint fluid and air are noted. IMPRESSION: Postoperative changes of the left knee are noted. Total left knee replacement is identified without malalignment. Electronically Signed   By: Abelardo Diesel M.D.   On: 10/23/2017 15:04    Disposition: Discharge disposition: 01-Home or Self Care       Discharge Instructions    Call MD / Call 911   Complete by:  As directed    If you experience chest pain or shortness of breath, CALL 911 and be transported to the hospital emergency room.  If you develope a fever above 101 F, pus (white drainage) or increased drainage or redness at the wound, or calf pain, call your surgeon's office.   Constipation Prevention   Complete by:  As directed    Drink plenty of fluids.  Prune juice may be helpful.  You may use a stool softener, such as Colace (over the counter) 100 mg twice a day.  Use MiraLax (over the counter) for constipation as needed.   Diet - low sodium heart healthy   Complete by:  As directed    Do not put a pillow under the knee. Place it under the heel.   Complete by:  As directed    Driving restrictions   Complete by:  As directed    No driving for 6 weeks   Increase activity slowly as tolerated   Complete by:  As directed    Lifting restrictions   Complete by:  As directed    No lifting for 6 weeks   TED hose   Complete by:  As directed    Use stockings (TED hose) for 2 weeks on both leg(s).  You may remove them at night for sleeping.       Follow-up Information    Michell Kader, Aaron Edelman, MD. Schedule an appointment as soon as possible for a visit in 2 weeks.   Specialty:  Orthopedic Surgery Why:  For wound re-check Contact information: [address] [place] [address] [phone]            Signed: Hilton Cork Jaymi Tinner  10/24/2017, 7:59 AM

## 2017-10-24 NOTE — Progress Notes (Signed)
    Subjective:  Patient reports pain as mild to moderate.  Denies N/V/CP/SOB. No c/o.  Objective:   VITALS:   Vitals:   10/23/17 1817 10/23/17 2240 10/24/17 0221 10/24/17 0709  BP: 129/81 (!) 99/59 104/68 110/70  Pulse: 85 75 65 69  Resp: 16 16 17 17   Temp: 98.2 F (36.8 C) 98.5 F (36.9 C) 98.2 F (36.8 C) 97.7 F (36.5 C)  TempSrc: Oral Oral Oral Oral  SpO2: 96% 98% 99% 97%  Weight:      Height:        NAD ABD soft Sensation intact distally Intact pulses distally Dorsiflexion/Plantar flexion intact Incision: dressing C/D/I Compartment soft   Lab Results  Component Value Date   WBC 14.6 (H) 10/24/2017   HGB 12.5 (L) 10/24/2017   HCT 36.1 (L) 10/24/2017   MCV 90.9 10/24/2017   PLT 176 10/24/2017   BMET    Component Value Date/Time   NA 139 10/24/2017 0534   K 4.4 10/24/2017 0534   CL 107 10/24/2017 0534   CO2 24 10/24/2017 0534   GLUCOSE 159 (H) 10/24/2017 0534   BUN 20 10/24/2017 0534   CREATININE 1.11 10/24/2017 0534   CALCIUM 8.5 (L) 10/24/2017 0534   GFRNONAA >60 10/24/2017 0534   GFRAA >60 10/24/2017 0534     Assessment/Plan: 1 Day Post-Op   Principal Problem:   Traumatic arthritis of knee, left Active Problems:   Traumatic arthritis of left knee   WBAT with walker DVT ppx: Aspirin, SCDs, TEDS PO pain control PT/OT Dispo: D/C home with outpatient PT, has DME at home   Bertram Savin 10/24/2017, 7:56 AM   Rod Can, MD Cell [phone]

## 2017-10-24 NOTE — Anesthesia Postprocedure Evaluation (Signed)
Anesthesia Post Note  Patient: [name] [name]  Procedure(s) Performed: LEFT TOTAL KNEE ARTHROPLASTY WITH COMPUTER NAVIGATION (Left Knee)     Patient location during evaluation: PACU Anesthesia Type: Spinal Level of consciousness: awake and alert Pain management: pain level controlled Vital Signs Assessment: post-procedure vital signs reviewed and stable Respiratory status: spontaneous breathing and respiratory function stable Cardiovascular status: blood pressure returned to baseline and stable Postop Assessment: spinal receding Anesthetic complications: no    Last Vitals:  Vitals:   10/24/17 0221 10/24/17 0709  BP: 104/68 110/70  Pulse: 65 69  Resp: 17 17  Temp: 36.8 C 36.5 C  SpO2: 99% 97%    Last Pain:  Vitals:   10/24/17 0709  TempSrc: Oral  PainSc:                  Nolon Nations

## 2017-10-24 NOTE — Care Management (Signed)
Per Pt DC plan is for OPPT, He has a RW, Shower chair, and toilet riser. Declines 3in1 Marney Doctor RN,BSN [phone]

## 2017-10-24 NOTE — Progress Notes (Signed)
Physical Therapy Treatment Patient Details Name: [name] [name] MRN: [id number] DOB: May 19, 1960 Today's Date: 10/24/2017    History of Present Illness Pt is a 57 YO male s/p L TKR on 10/23/17. PMH includes obesity, OSA, CAD, HTN, HLD, anxiety, depression, DJD, MI. Surgical history includes R ACL repair, cardiac cath, coronary stent placement.     PT Comments    POD # 1 pm session Assisted with amb a greater distance in hallway and practiced 6 steps B rails.  Returned to room then perform sitting TKR TE's.  Instructed on proper tech.  Applied ICE.  All mobility questions addressed.  Pt ready for D/C to home.   Follow Up Recommendations  Follow surgeon's recommendation for DC plan and follow-up therapies     Equipment Recommendations  Rolling walker with 5" wheels    Recommendations for Other Services       Precautions / Restrictions Precautions Precautions: Fall Restrictions Weight Bearing Restrictions: No Other Position/Activity Restrictions: WBAT     Mobility  Bed Mobility               General bed mobility comments: OOB in recliner  Transfers Overall transfer level: Needs assistance Equipment used: Rolling walker (2 wheeled) Transfers: Sit to/from Stand Sit to Stand: Min guard         General transfer comment: Min guard for safety. Verbal cuing for hand placement, increased time to perform.   Ambulation/Gait Ambulation/Gait assistance: Min guard;Supervision Gait Distance (Feet): 115 Feet   Gait Pattern/deviations: Step-to pattern;Decreased stride length;Decreased weight shift to left;Decreased stance time - left;Antalgic Gait velocity: decreased   General Gait Details: <255 VC's safety with turns and hand placement with stand to sit   Stairs Stairs: Yes Stairs assistance: Supervision;Min guard Stair Management: Two rails;Step to pattern;Forwards Number of Stairs: 6 General stair comments: <25% VC's on proper sequencing and safety            Performed well   Wheelchair Mobility    Modified Rankin (Stroke Patients Only)       Balance                                            Cognition Arousal/Alertness: Awake/alert Behavior During Therapy: WFL for tasks assessed/performed Overall Cognitive Status: Within Functional Limits for tasks assessed                                        Exercises   10 reps seated knee flex  05 reps assisted sitting knee flex  10 reps LAQ's   General Comments        Pertinent Vitals/Pain Pain Assessment: 0-10 Pain Score: 4  Pain Location: L knee Pain Descriptors / Indicators: Operative site guarding;Discomfort Pain Intervention(s): Monitored during session;Premedicated before session;Repositioned;Ice applied    Home Living                      Prior Function            PT Goals (current goals can now be found in the care plan section) Progress towards PT goals: Progressing toward goals    Frequency    7X/week      PT Plan Current plan remains appropriate    Co-evaluation  AM-PAC PT "6 Clicks" Daily Activity  Outcome Measure  Difficulty turning over in bed (including adjusting bedclothes, sheets and blankets)?: A Lot Difficulty moving from lying on back to sitting on the side of the bed? : A Lot Difficulty sitting down on and standing up from a chair with arms (e.g., wheelchair, bedside commode, etc,.)?: A Lot Help needed moving to and from a bed to chair (including a wheelchair)?: A Little Help needed walking in hospital room?: A Little Help needed climbing 3-5 steps with a railing? : A Lot 6 Click Score: 14    End of Session Equipment Utilized During Treatment: Gait belt Activity Tolerance: Patient tolerated treatment well;Patient limited by pain Patient left: in chair;with chair alarm set;with call bell/phone within reach;with family/visitor present;with SCD's reapplied Nurse Communication:  Mobility status PT Visit Diagnosis: Other abnormalities of gait and mobility (R26.89);Difficulty in walking, not elsewhere classified (R26.2)     Time: 2035-5974 PT Time Calculation (min) (ACUTE ONLY): 29 min  Charges:  $Gait Training: 8-22 mins $Therapeutic Activity: 8-22 mins                     Rica Koyanagi  PTA [facility]  Acute  Rehab Pager      [phone]

## 2017-12-08 DIAGNOSIS — Z5189 Encounter for other specified aftercare: Secondary | ICD-10-CM | POA: Insufficient documentation

## 2017-12-31 DIAGNOSIS — H6123 Impacted cerumen, bilateral: Secondary | ICD-10-CM | POA: Insufficient documentation

## 2017-12-31 DIAGNOSIS — R490 Dysphonia: Secondary | ICD-10-CM | POA: Insufficient documentation

## 2018-07-31 DIAGNOSIS — Z96659 Presence of unspecified artificial knee joint: Secondary | ICD-10-CM | POA: Insufficient documentation

## 2018-09-24 DIAGNOSIS — R972 Elevated prostate specific antigen [PSA]: Secondary | ICD-10-CM | POA: Insufficient documentation

## 2018-10-05 DIAGNOSIS — Z8739 Personal history of other diseases of the musculoskeletal system and connective tissue: Secondary | ICD-10-CM | POA: Insufficient documentation

## 2019-06-09 DIAGNOSIS — C61 Malignant neoplasm of prostate: Secondary | ICD-10-CM | POA: Insufficient documentation

## 2019-06-14 ENCOUNTER — Encounter: Payer: Self-pay | Admitting: Radiation Oncology

## 2019-06-14 NOTE — Progress Notes (Signed)
GU Location of Tumor / Histology: prostatic adenocarcinoma  If Prostate Cancer, Gleason Score is (3 + 4) and PSA is (9.7). Prostate volume: 34 g  [name] [name] was referred by Hosie Poisson, NP after she discovered his PSA was 7.6 in July 2019 and referred to Dr. Jeffie Pollock.   Biopsies of biopsy (if applicable) revealed:   Past/Anticipated interventions by urology, if any: prostate biopsy, referral to Dr. Tammi Klippel to discuss radiotherapy options  Past/Anticipated interventions by medical oncology, if any: no  Weight changes, if any: no  Bowel/Bladder complaints, if any: IPSS 1. SHIM 21. Denies dysuria or hematuria. Denies urinary leakage or incontinence. Nocturia x 1 rarely. Occasional ED.  Nausea/Vomiting, if any: no  Pain issues, if any:  Arthritic pain in knuckles and wrist. Explains he has just began taking mobic and allopurinol to aid in management of this pain.  SAFETY ISSUES:  Prior radiation? denies  Pacemaker/ICD? denies  Possible current pregnancy? no, male patient  Is the patient on methotrexate? 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 Father with hx of lung ca.

## 2019-06-15 ENCOUNTER — Ambulatory Visit
Admission: RE | Admit: 2019-06-15 | Discharge: 2019-06-15 | Disposition: A | Discharge status: Home / Self Care | Payer: 59 | Source: Ambulatory Visit | Attending: Radiation Oncology | Admitting: Radiation Oncology

## 2019-06-15 ENCOUNTER — Encounter: Payer: Self-pay | Admitting: Radiation Oncology

## 2019-06-15 VITALS — Ht 70.5 in | Wt 252.0 lb

## 2019-06-15 DIAGNOSIS — C61 Malignant neoplasm of prostate: Secondary | ICD-10-CM

## 2019-06-15 HISTORY — DX: Malignant neoplasm of prostate: C61

## 2019-06-15 IMAGING — DX DG KNEE 1-2V PORT*L*
2 series · 2 of 2 positions shown · non-contrast
Comparison: None.

CLINICAL DATA: Status post total left knee replacement.

EXAM:
PORTABLE LEFT KNEE - 1-2 VIEW

[knee ap]
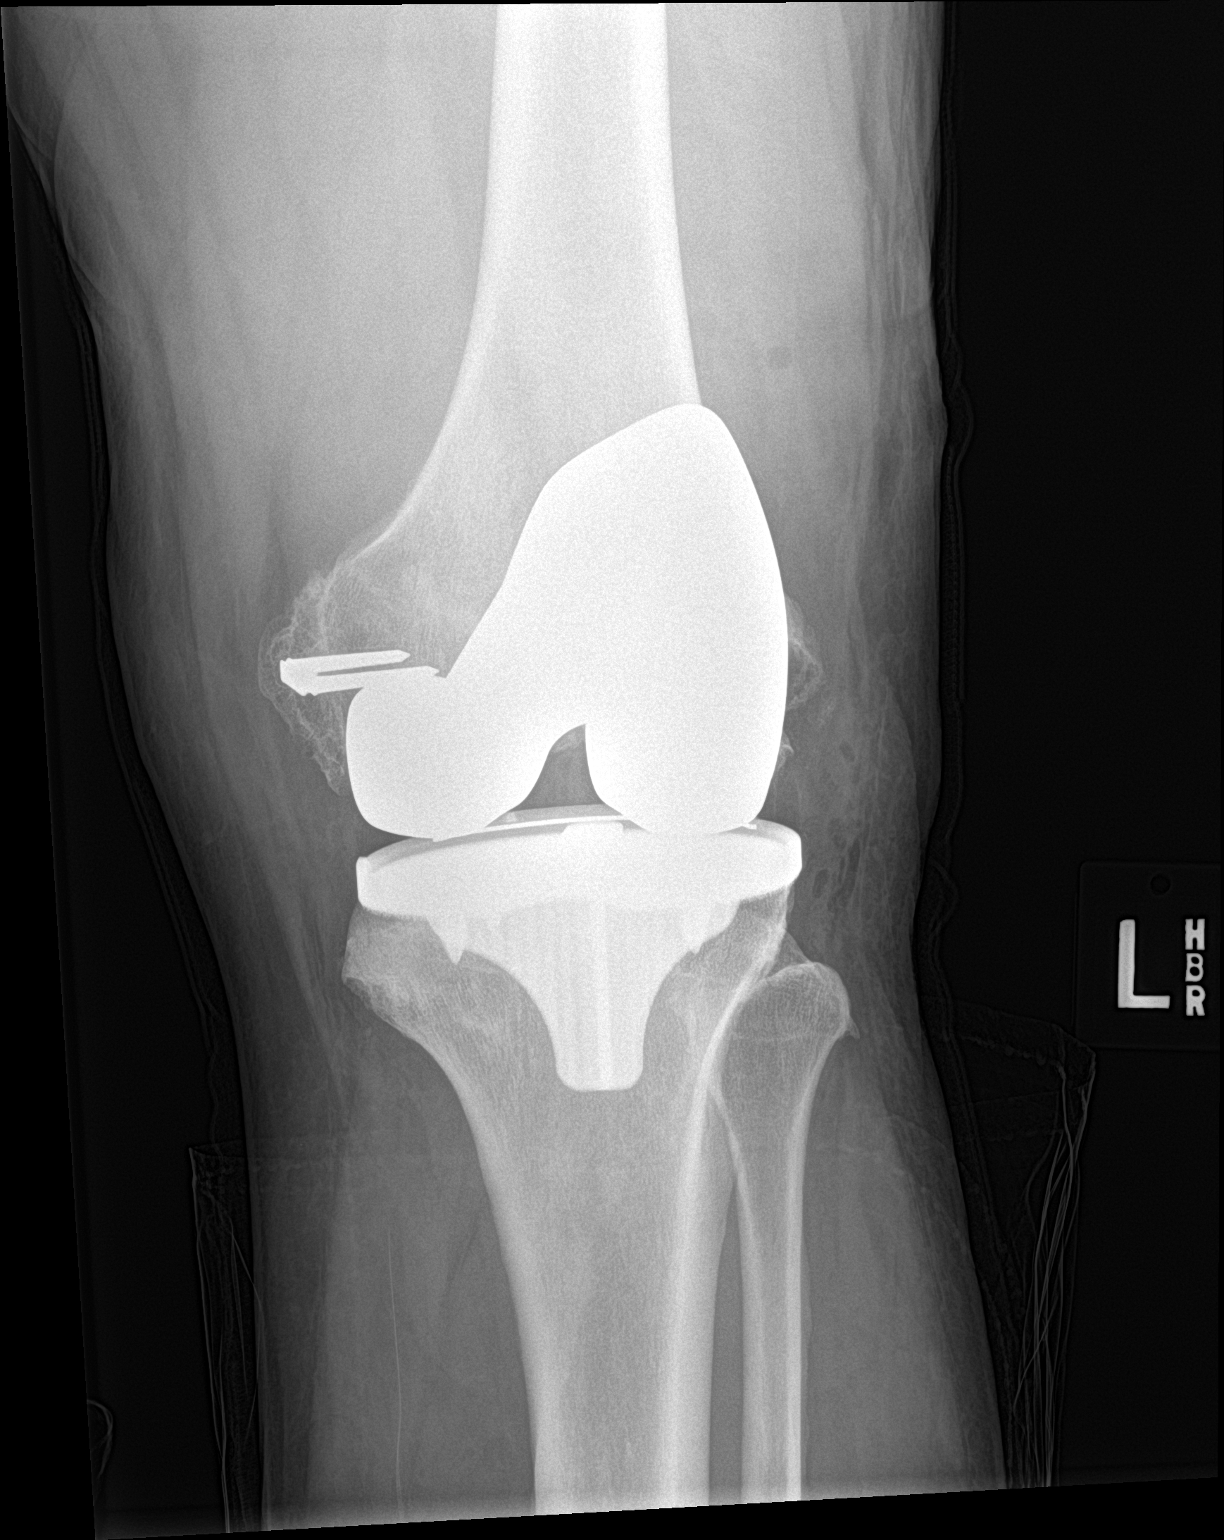

[knee lat]
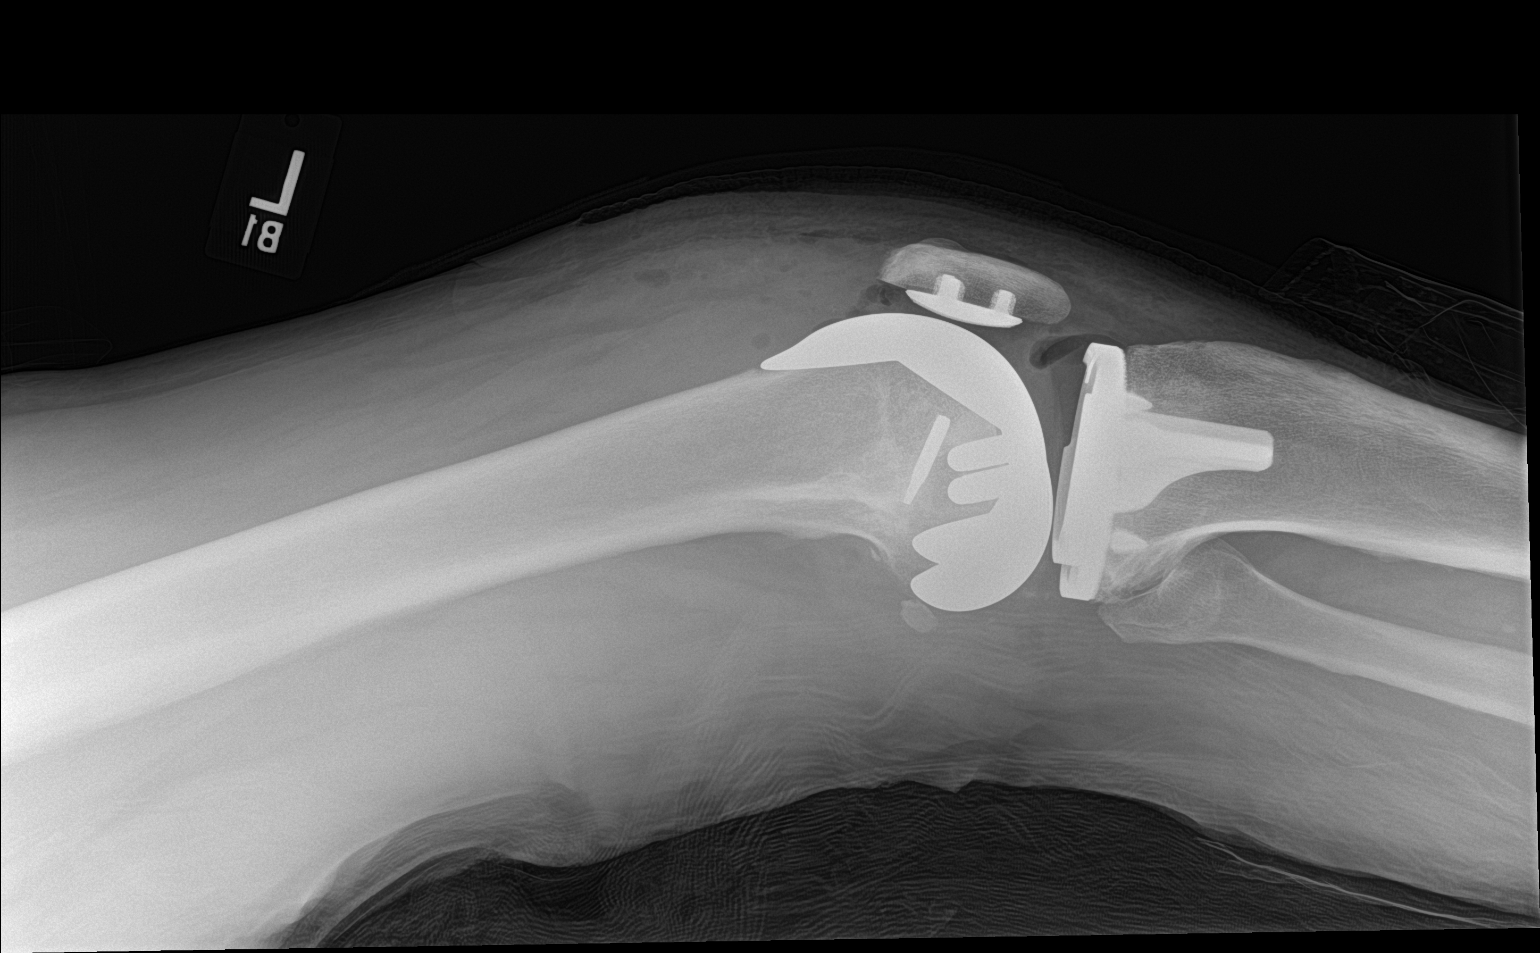

[2 of 2 positions shown; findings below may reference images not displayed]

FINDINGS: Total left knee replacement is identified without malalignment.
Postsurgical change including joint fluid and air are noted.
IMPRESSION: Postoperative changes of the left knee are noted. Total left knee
replacement is identified without malalignment.

## 2019-06-15 NOTE — Progress Notes (Signed)
See progress notes under physician encounter. 

## 2019-06-15 NOTE — Progress Notes (Signed)
Radiation Oncology         [phone] ________________________________  Initial Outpatient Consultation - Conducted via Telephone due to current COVID-19 concerns for limiting patient exposure  Name: ZALE MARCOTTE MRN: [id number]  Date: 06/15/2019  DOB: 07-Dec-1960  NO:MVEHMCNOBS, [place]  Raynelle Bring, MD   REFERRING PHYSICIAN: Raynelle Bring, MD  DIAGNOSIS: 59 y.o. gentleman with Stage T1c adenocarcinoma of the prostate with Gleason score of 3+4, and PSA of 9.7.    ICD-10-CM   1. Malignant neoplasm of prostate ([place])  C61     HISTORY OF PRESENT ILLNESS: RIGLEY NIESS is a 60 y.o. male with a diagnosis of prostate cancer. He was noted to have an elevated PSA of 7.6 by his primary care provider, Hosie Poisson, NP. Accordingly, he was referred for evaluation in urology by Dr. Jeffie Pollock on 10/02/2017,  digital rectal examination was performed at that time revealing no nodules.  A repeat PSA after 1 week of abstinence remained elevated at 6.23 on 10/09/17 and he was lost to follow up thereafter.  Unfortunately, his PSA increased to 9.6 in 09/2018.  He was referred back to Dr. Jeffie Pollock on 04/28/2019, and repeat PSA at that time was stable at 9.7.  The patient proceeded to transrectal ultrasound with 12 biopsies of the prostate on 05/31/2019.  The prostate volume measured 34 cc.  Out of 12 core biopsies, 8 were positive.  The maximum Gleason score was 3+4, and this was seen in the the left apex (with PNI) and left mid lateral.  Additionally, Gleason 3+3 was seen in the left base lateral (with PNI), left apex lateral (with PNI), left base, left mid, right mid lateral (small foci), and very right apex lateral.  The patient reviewed the biopsy results with his urologist and he has kindly been referred today for discussion of potential radiation treatment options.  He met with Dr. Alinda Money in consult on 06/08/2019 to further discuss the potential for prostatectomy but remained  undecided and elected to pursue radiation treatment options prior to committing to treatment.   PREVIOUS RADIATION THERAPY: No  PAST MEDICAL HISTORY:  Past Medical History:  Diagnosis Date  . Anxiety   . 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 Has left to right collaterals. Last cath in 2009 with stent to to LAD. EF 45 to 50%  . Depression   . DJD (degenerative joint disease)   . Hyperlipidemia   . Hypertension   . 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 Obesity   . OSA (obstructive sleep apnea) 03/01/2014   Severe with AHI 72.5 events/hr; no CPAP in use since weight loss   . Prostate cancer ([place])   . Sleep apnea   . Stress       PAST SURGICAL HISTORY: Past Surgical History:  Procedure Laterality Date  . ANTERIOR CRUCIATE LIGAMENT REPAIR Right 1990  . CARDIAC CATHETERIZATION  10/09/2007   EF 45-50%  . CARDIOVASCULAR STRESS TEST  11/23/2008   EF 55%  . CORONARY STENT PLACEMENT     RIGHT CORONARY THAT SUBSEQUENTLY OCCLUDED.  [name] CORONARY STENT PLACEMENT     LAD WITH A 3.0X15MM PROMUS DRUG-ELUTING STENT PLACED.  [name] KNEE ARTHROPLASTY Left 10/23/2017   Procedure: LEFT TOTAL KNEE ARTHROPLASTY WITH COMPUTER NAVIGATION;  Surgeon: Rod Can, MD;  Location: [facility];  Service: Orthopedics;  Laterality: Left;  Adductor Block  . KNEE ARTHROSCOPY Left 1982  .  US ECHOCARDIOGRAPHY  03/14/2003   EF 55-60%    FAMILY HISTORY:  Family History  Problem Relation Age of Onset  . Hypertension Mother   . Lung cancer Father        smoked three packs per day  . Prostate cancer Neg Hx   . Colon cancer Neg Hx   . Pancreatic cancer Neg Hx   . Breast cancer Neg Hx     SOCIAL HISTORY:  Social History   Socioeconomic History  . Marital status: Married    Spouse name: Joseph Art  . Number of children: 3  . Years of education: Not on file  . Highest education level: Not on file  Occupational History  . Not on file  Tobacco Use  . Smoking  status: Never Smoker  . Smokeless tobacco: Never Used  Substance and Sexual Activity  . Alcohol use: Yes    Comment: occ  . Drug use: Yes    Types: Marijuana    Comment: last week was last use   . Sexual activity: Yes  Other Topics Concern  . Not on file  Social History Narrative   3 sons   Social Determinants of Health   Financial Resource Strain:   . Difficulty of Paying Living Expenses:   Food Insecurity:   . Worried About Charity fundraiser in the Last Year:   . Arboriculturist in the Last Year:   Transportation Needs:   . Film/video editor (Medical):   [name] Lack of Transportation (Non-Medical):   Physical Activity:   . Days of Exercise per Week:   . Minutes of Exercise per Session:   Stress:   . Feeling of Stress :   Social Connections:   . Frequency of Communication with Friends and Family:   . Frequency of Social Gatherings with Friends and Family:   . Attends Religious Services:   . Active Member of Clubs or Organizations:   . Attends Archivist Meetings:   [name] Marital Status:   Intimate Partner Violence:   . Fear of Current or Ex-Partner:   . Emotionally Abused:   [name] Physically Abused:   . Sexually Abused:     ALLERGIES: Patient has no known allergies.  MEDICATIONS:  Current Outpatient Medications  Medication Sig Dispense Refill  . allopurinol (ZYLOPRIM) 100 MG tablet Take by mouth.    [name] aspirin 81 MG chewable tablet Chew 1 tablet (81 mg total) by mouth 2 (two) times daily. (Patient taking differently: Chew 81 mg by mouth once. ) 60 tablet 1  . hydrochlorothiazide (HYDRODIURIL) 25 MG tablet TAKE 1 TABLET (25 MG TOTAL) BY MOUTH DAILY. (Patient taking differently: Take 25 mg by mouth daily. ) 90 tablet 2  . lisinopril (PRINIVIL,ZESTRIL) 20 MG tablet TAKE 1 TABLET BY MOUTH EVERY DAY 30 tablet 5  . meloxicam (MOBIC) 15 MG tablet Take by mouth. Prescribed along with allopurinol to aid in the management of arthritic pain in knuckles and wrist.    .  metoprolol tartrate (LOPRESSOR) 25 MG tablet Take 1 tablet (25 mg total) by mouth 2 (two) times daily. 60 tablet 2  . pravastatin (PRAVACHOL) 40 MG tablet Take 40 mg by mouth daily.  1   No current facility-administered medications for this encounter.    REVIEW OF SYSTEMS:  On review of systems, the patient reports that he is doing well overall. He denies any chest pain, shortness of breath, cough, fevers, chills, night sweats, unintended weight changes. He denies any  bowel disturbances, and denies abdominal pain, nausea or vomiting. He denies any new musculoskeletal or joint aches or pains. His IPSS was 1, indicating minimal urinary symptoms. His SHIM was 21, indicating he does not have erectile dysfunction. A complete review of systems is obtained and is otherwise negative.    PHYSICAL EXAM:  Wt Readings from Last 3 Encounters:  06/15/19 252 lb (114.3 kg)  10/23/17 246 lb (111.6 kg)  10/16/17 246 lb (111.6 kg)   Temp Readings from Last 3 Encounters:  10/24/17 97.7 F (36.5 C) (Oral)  10/16/17 97.9 F (36.6 C) (Oral)   BP Readings from Last 3 Encounters:  10/24/17 110/70  10/16/17 138/83  06/10/14 112/80   Pulse Readings from Last 3 Encounters:  10/24/17 69  10/16/17 63  06/10/14 72   Pain Assessment Pain Score: 1  Pain Frequency: Constant Pain Loc: Hand(arthritis)/10  Physical exam not performed in light of telephone consult visit format.   KPS = 90  100 - Normal; no complaints; no evidence of disease. 90   - Able to carry on normal activity; minor signs or symptoms of disease. 80   - Normal activity with effort; some signs or symptoms of disease. 42   - Cares for self; unable to carry on normal activity or to do active work. 60   - Requires occasional assistance, but is able to care for most of his personal needs. 50   - Requires considerable assistance and frequent medical care. 66   - Disabled; requires special care and assistance. 32   - Severely disabled;  hospital admission is indicated although death not imminent. 54   - Very sick; hospital admission necessary; active supportive treatment necessary. 10   - Moribund; fatal processes progressing rapidly. 0     - Dead  Karnofsky DA, Abelmann [place], Craver LS and Burchenal [facility] [phone]) The use of the nitrogen mustards in the palliative treatment of carcinoma: with particular reference to bronchogenic carcinoma Cancer 1 [id number]  LABORATORY DATA:  Lab Results  Component Value Date   WBC 14.6 (H) 10/24/2017   HGB 12.5 (L) 10/24/2017   HCT 36.1 (L) 10/24/2017   MCV 90.9 10/24/2017   PLT 176 10/24/2017   Lab Results  Component Value Date   NA 139 10/24/2017   K 4.4 10/24/2017   CL 107 10/24/2017   CO2 24 10/24/2017   Lab Results  Component Value Date   ALT 45 01/19/2014   AST 31 01/19/2014   ALKPHOS 89 01/19/2014   BILITOT 0.7 01/19/2014     RADIOGRAPHY: No results found.    IMPRESSION/PLAN: This visit was conducted via Telephone to spare the patient unnecessary potential exposure in the healthcare setting during the current COVID-19 pandemic. 1. 59 y.o. gentleman with Stage T1c adenocarcinoma of the prostate with Gleason Score of 3+4, and PSA of 9.7. We discussed the patient's workup and outlined the nature of prostate cancer in this setting. The patient's T stage, Gleason's score, and PSA put him into the favorable intermediate risk group. Accordingly, he is eligible for a variety of potential treatment options including brachytherapy, 5.5 weeks of external radiation, or prostatectomy. We discussed the available radiation techniques, and focused on the details and logistics of delivery. We discussed and outlined the risks, benefits, short and long-term effects associated with radiotherapy and compared and contrasted these with prostatectomy. We discussed the role of SpaceOAR in reducing the rectal toxicity associated with radiotherapy.   At the end of the conversation the patient remains  undecided regarding his final treatment preference.  He would like to take some additional time to consider his options prior to committing to treatment.  We will share our discussion with Dr. Jeffie Pollock and Dr. Alinda Money and await his final decision.  He has our contact information and plans to reach back out should he elect to proceed with radiotherapy.  We will also plan to follow-up with him in the next 2 to 3 weeks if we have not already heard from him.  We enjoyed meeting him today and look forward to continuing to participate in his care.  Given current concerns for patient exposure during the COVID-19 pandemic, this encounter was conducted via telephone. The patient was notified in advance and was offered a MyChart meeting to allow for face to face communication but unfortunately reported that he did not have the appropriate resources/technology to support such a visit and instead preferred to proceed with telephone consult. The patient has given verbal consent for this type of encounter. The time spent during this encounter was 45 minutes. The attendants for this meeting include Tyler Pita MD, Ashlyn Bruning PA-C, [place], and patient, JAY HASKEW. During the encounter, Tyler Pita MD, Ashlyn Bruning PA-C, and scribe, Wilburn Mylar were located at [place].  Patient, JAYMIR STRUBLE was located at home.    Nicholos Johns, PA-C    Tyler Pita, MD  [facility] Direct Dial: [phone]  Fax: [phone] [email or web address]  Skype  LinkedIn  This document serves as a record of services personally performed by Tyler Pita, MD and Freeman Caldron, PA-C. It was created on their behalf by Wilburn Mylar, a trained medical scribe. The creation of this record is based on the scribe's personal observations and the provider's statements to them. This document has been checked and approved  by the attending provider.

## 2019-06-22 ENCOUNTER — Telehealth: Payer: Self-pay | Admitting: Medical Oncology

## 2019-06-22 NOTE — Telephone Encounter (Signed)
Spoke with patient to introduce myself as the prostate nurse navigator and discuss my role. I was unable to meet him 4/27, when he consulted with Dr. Tammi Klippel. After the consult he was still considering his treatment options. He states he is 95% sure he is going with surgery, but will make his final decision after he gets a few questions answered by Dr. Alinda Money. He left a message with Dr. Lynne Logan office  this morning and after her gets the answers, he will let us know of his final decision. He feels if he has surgery and the cancer returns, he will have the radiation as an option. He states that he is impressed with Dr. Alinda Money and Dr. Johny Shears presentation of treatment options and their willingness to answer his questions. I asked him to call me if I can answer any questions or assist him in any way. He was very appreciative of my call and will be in touch.

## 2019-06-25 ENCOUNTER — Other Ambulatory Visit: Payer: Self-pay | Admitting: Urology

## 2019-06-25 DIAGNOSIS — C61 Malignant neoplasm of prostate: Secondary | ICD-10-CM

## 2019-06-29 ENCOUNTER — Other Ambulatory Visit: Payer: Self-pay | Admitting: Urology

## 2019-07-26 ENCOUNTER — Other Ambulatory Visit: Payer: Self-pay | Admitting: Urology

## 2019-07-26 ENCOUNTER — Ambulatory Visit
Admission: RE | Admit: 2019-07-26 | Discharge: 2019-07-26 | Disposition: A | Discharge status: Home / Self Care | Payer: 59 | Source: Ambulatory Visit | Attending: Urology | Admitting: Urology

## 2019-07-26 DIAGNOSIS — Z77018 Contact with and (suspected) exposure to other hazardous metals: Secondary | ICD-10-CM

## 2019-07-26 DIAGNOSIS — C61 Malignant neoplasm of prostate: Secondary | ICD-10-CM

## 2019-07-26 MED ORDER — GADOBENATE DIMEGLUMINE 529 MG/ML IV SOLN
20.0000 mL | Freq: Once | INTRAVENOUS | Status: AC | PRN
  Administered 2019-07-26: 20 mL via INTRAVENOUS

## 2019-08-19 NOTE — Patient Instructions (Addendum)
DUE TO COVID-19 ONLY ONE VISITOR IS ALLOWED TO COME WITH YOU AND STAY IN THE WAITING ROOM ONLY DURING PRE OP AND PROCEDURE DAY OF SURGERY. TWO  VISITOR MAY VISIT WITH YOU AFTER SURGERY IN YOUR PRIVATE ROOM DURING VISITING HOURS ONLY!  10a-8p  YOU NEED TO HAVE A COVID 19 TEST ON_7-12-21______ @_______ , THIS TEST MUST BE DONE BEFORE SURGERY, COME  [address], [place] , [id number].  ([place]) ONCE YOUR COVID TEST IS COMPLETED, PLEASE BEGIN THE QUARANTINE INSTRUCTIONS AS OUTLINED IN YOUR HANDOUT.                ASAH LAMAY  08/19/2019   Your procedure is scheduled on: 09-02-19   Report to [facility] Main  Entrance   Report to admitting at       [place]  AM     Call this number if you have problems the morning of surgery [phone]    Remember: Drink one 8 oz bottle of magnesium citrate by noon the day before surgery                                     Do one fleets enema the night before surgery                                 Do not eat food  :After Midnight. You may have clear liquids until 0800 am then nothing by mouth    CLEAR LIQUID DIET   Foods Allowed                                                                             Foods Excluded  Coffee and tea, regular and decaf  No creamer                           liquids that you cannot  Plain Jell-O any favor except red or purple                                           see through such as: Fruit ices (not with fruit pulp)                                                           milk, soups, orange juice  Iced Popsicles                                                        All solid food Carbonated beverages, regular and diet  Cranberry, grape and apple juices Sports drinks like Gatorade Lightly seasoned clear broth or consume(fat free) Sugar, honey syrup   _____________________________________________________________________   BRUSH YOUR TEETH  MORNING OF SURGERY AND RINSE YOUR MOUTH OUT, NO CHEWING GUM CANDY OR MINTS.     Take these medicines the morning of surgery with A SIP OF WATER: prevastatin, metoprolol, allopurinol                                 You may not have any metal on your body including hair pins and              piercings  Do not wear jewelry,  lotions, powders or perfumes, deodorant              Men may shave face and neck.   Do not bring valuables to the hospital. [place].  Contacts, dentures or bridgework may not be worn into surgery.      Patients discharged the day of surgery will not be allowed to drive home. IF YOU ARE HAVING SURGERY AND GOING HOME THE SAME DAY, YOU MUST HAVE AN ADULT TO DRIVE YOU HOME AND BE WITH YOU FOR 24 HOURS. YOU MAY GO HOME BY TAXI OR UBER OR ORTHERWISE, BUT AN ADULT MUST ACCOMPANY YOU HOME AND STAY WITH YOU FOR 24 HOURS.  Name and phone number of your driver:  Special Instructions: N/A              Please read over the following fact sheets you were given: _____________________________________________________________________             [facility] - Preparing for Surgery Before surgery, you can play an important role.  Because skin is not sterile, your skin needs to be as free of germs as possible.  You can reduce the number of germs on your skin by washing with CHG (chlorahexidine gluconate) soap before surgery.  CHG is an antiseptic cleaner which kills germs and bonds with the skin to continue killing germs even after washing. Please DO NOT use if you have an allergy to CHG or antibacterial soaps.  If your skin becomes reddened/irritated stop using the CHG and inform your nurse when you arrive at Short Stay. Do not shave (including legs and underarms) for at least 48 hours prior to the first CHG shower.  You may shave your face/neck. Please follow these instructions carefully:  1.  Shower with CHG Soap the night before  surgery and the  morning of Surgery.  2.  If you choose to wash your hair, wash your hair first as usual with your  normal  shampoo.  3.  After you shampoo, rinse your hair and body thoroughly to remove the  shampoo.                           4.  Use CHG as you would any other liquid soap.  You can apply chg directly  to the skin and wash                       Gently with a scrungie or clean washcloth.  5.  Apply the CHG Soap to your body ONLY FROM THE NECK DOWN.   Do not use on face/  open                           Wound or open sores. Avoid contact with eyes, ears mouth and genitals (private parts).                       Wash face,  Genitals (private parts) with your normal soap.             6.  Wash thoroughly, paying special attention to the area where your surgery  will be performed.  7.  Thoroughly rinse your body with warm water from the neck down.  8.  DO NOT shower/wash with your normal soap after using and rinsing off  the CHG Soap.                9.  Pat yourself dry with a clean towel.            10.  Wear clean pajamas.            11.  Place clean sheets on your bed the night of your first shower and do not  sleep with pets. Day of Surgery : Do not apply any lotions/deodorants the morning of surgery.  Please wear clean clothes to the hospital/surgery center.  FAILURE TO FOLLOW THESE INSTRUCTIONS MAY RESULT IN THE CANCELLATION OF YOUR SURGERY PATIENT SIGNATURE_________________________________  NURSE SIGNATURE__________________________________  ________________________________________________________________________

## 2019-08-19 NOTE — Progress Notes (Signed)
PCP - Bridgette Habermann, [place] Cardiologist - Megan Clarkston , NP clearance 05-03-19 epic  PPM/ICD -  Device Orders -  Rep Notified -   Chest x-ray -  EKG - 05-03-19 on chart Stress Test -  ECHO -  Cardiac Cath -   Sleep Study -  CPAP - YES  Fasting Blood Sugar -  Checks Blood Sugar _____ times a day  Blood Thinner Instructions: Aspirin Instructions:81 mg asa hold 2 days  prior ERAS Protcol - PRE-SURGERY Ensure or G2-    COVID TEST- 7-12  Activity able to walk a flight of stairs without SOB Anesthesia review: CAD,HTN,MI 2005, OSA   Patient denies shortness of breath, fever, cough and chest pain at PAT appointment NONE   All instructions explained to the patient, with a verbal understanding of the material. Patient agrees to go over the instructions while at home for a better understanding. Patient also instructed to self quarantine after being tested for COVID-19. The opportunity to ask questions was provided.

## 2019-08-24 ENCOUNTER — Encounter (HOSPITAL_COMMUNITY)
Admission: RE | Admit: 2019-08-24 | Discharge: 2019-08-24 | Disposition: A | Discharge status: Home / Self Care | Payer: 59 | Source: Ambulatory Visit | Attending: Urology | Admitting: Urology

## 2019-08-24 ENCOUNTER — Encounter (HOSPITAL_COMMUNITY): Payer: Self-pay

## 2019-08-24 ENCOUNTER — Other Ambulatory Visit: Payer: Self-pay

## 2019-08-24 DIAGNOSIS — Z01818 Encounter for other preprocedural examination: Secondary | ICD-10-CM | POA: Insufficient documentation

## 2019-08-24 LAB — CBC
HCT: 48 % (ref 39.0–52.0)
Hemoglobin: 16.1 g/dL (ref 13.0–17.0)
MCH: 31.3 pg (ref 26.0–34.0)
MCHC: 33.5 g/dL (ref 30.0–36.0)
MCV: 93.4 fL (ref 80.0–100.0)
Platelets: 217 10*3/uL (ref 150–400)
RBC: 5.14 MIL/uL (ref 4.22–5.81)
RDW: 13.2 % (ref 11.5–15.5)
WBC: 6.9 10*3/uL (ref 4.0–10.5)
nRBC: 0 % (ref 0.0–0.2)

## 2019-08-24 LAB — BASIC METABOLIC PANEL
Anion gap: 11 (ref 5–15)
BUN: 26 mg/dL — ABNORMAL HIGH (ref 6–20)
CO2: 27 mmol/L (ref 22–32)
Calcium: 9.6 mg/dL (ref 8.9–10.3)
Chloride: 102 mmol/L (ref 98–111)
Creatinine, Ser: 1.41 mg/dL — ABNORMAL HIGH (ref 0.61–1.24)
GFR calc Af Amer: >60 mL/min (ref >60)
GFR calc non Af Amer: 54 mL/min — ABNORMAL LOW (ref >60)
Glucose, Bld: 109 mg/dL — ABNORMAL HIGH (ref 70–99)
Potassium: 4.7 mmol/L (ref 3.5–5.1)
Sodium: 140 mmol/L (ref 135–145)

## 2019-08-30 ENCOUNTER — Other Ambulatory Visit (HOSPITAL_COMMUNITY)
Admission: RE | Admit: 2019-08-30 | Discharge: 2019-08-30 | Disposition: A | Discharge status: Home / Self Care | Payer: 59 | Source: Ambulatory Visit | Attending: Urology | Admitting: Urology

## 2019-08-30 DIAGNOSIS — Z01812 Encounter for preprocedural laboratory examination: Secondary | ICD-10-CM | POA: Insufficient documentation

## 2019-08-30 DIAGNOSIS — Z20822 Contact with and (suspected) exposure to covid-19: Secondary | ICD-10-CM | POA: Insufficient documentation

## 2019-08-30 LAB — SARS CORONAVIRUS 2 (TAT 6-24 HRS): SARS Coronavirus 2: NEGATIVE

## 2019-08-30 NOTE — Anesthesia Preprocedure Evaluation (Addendum)
Anesthesia Evaluation  Patient identified by MRN, date of birth, ID band Patient awake    Reviewed: Allergy & Precautions, NPO status , Patient's Chart, lab work & pertinent test results, reviewed documented beta blocker date and time   History of Anesthesia Complications Negative for: history of anesthetic complications  Airway Mallampati: II  TM Distance: >3 FB Neck ROM: Full    Dental  (+) Poor Dentition, Chipped, Dental Advisory Given, Missing   Pulmonary sleep apnea and Continuous Positive Airway Pressure Ventilation ,  08/30/2019 SARS coronavirus NEG   breath sounds clear to auscultation       Cardiovascular hypertension, Pt. on medications and Pt. on home beta blockers (-) angina+ CAD, + Past MI and + Cardiac Stents   Rhythm:Regular Rate:Normal  '19 Cardiolite: inferior infarct/scar, calculated EF 44%.  '19 ECHO: EF 50-55%, valves oK     Neuro/Psych Anxiety Depression negative neurological ROS     GI/Hepatic negative GI ROS, Neg liver ROS,   Endo/Other  Morbid obesity  Renal/GU Renal InsufficiencyRenal disease (creat 1.41)   Prostate cancer    Musculoskeletal  (+) Arthritis ,   Abdominal (+) + obese,   Peds  Hematology negative hematology ROS (+)   Anesthesia Other Findings   Reproductive/Obstetrics                           Anesthesia Physical Anesthesia Plan  ASA: III  Anesthesia Plan: General   Post-op Pain Management:    Induction: Intravenous  PONV Risk Score and Plan: 3 and Dexamethasone and Scopolamine patch - Pre-op  Airway Management Planned: Oral ETT  Additional Equipment: None  Intra-op Plan:   Post-operative Plan: Extubation in OR  Informed Consent: I have reviewed the patients History and Physical, chart, labs and discussed the procedure including the risks, benefits and alternatives for the proposed anesthesia with the patient or authorized  representative who has indicated his/her understanding and acceptance.     Dental advisory given  Plan Discussed with: CRNA and Surgeon  Anesthesia Plan Comments: (See PAT note 08/24/2019, Konrad Felix, PA-C)      Anesthesia Quick Evaluation

## 2019-08-30 NOTE — Progress Notes (Signed)
Anesthesia Chart Review   Case: [id number] Date/Time: 09/02/19 1100   Procedures:      XI ROBOTIC ASSISTED LAPAROSCOPIC RADICAL PROSTATECTOMY LEVEL 2 (N/A )     LYMPHADENECTOMY, PELVIC (Bilateral )     HERNIA REPAIR UMBILICAL ,ADULT (N/A )   Anesthesia type: General   Pre-op diagnosis: PROSTATE CANCER UMBILICAL HERNIA   Location: [facility] ROOM 03 / [facility]   Surgeons: Raynelle Bring, MD      DISCUSSION:59 y.o. never smoker with h/o HLD, HTN, CAD (MI, stents 2005/2009), OSA on CPAP, prostate cancer, umbilical hernia scheduled for above procedure 09/02/2019 with Dr. Raynelle Bring.   Cardiolite testing 08/20/2017 showed inferior infarct/scar with calculated ejection fraction of 44%.  Echocardiogram 09/26/2017 showed normal left ventricular function at 50 to 55%. The left atrium was mildly enlarged.  Last seen by cardiology 05/03/2019.  Per OV note no ischemic symptoms, clinically stable from cardiovascular standpoint.  1 year follow up recommended.  Pt can climb a flight of stairs without difficulty.   Anticipate pt can proceed with planned procedure barring acute status change.   VS: BP 140/89   Pulse 63   Temp 36.8 C (Oral)   Resp 16   Ht 5' 10.5" (1.791 m)   Wt 112 kg   SpO2 100%   BMI 34.94 kg/m   PROVIDERS: [facility]  LABS: Labs reviewed: Acceptable for surgery. (all labs ordered are listed, but only abnormal results are displayed)  Labs Reviewed  BASIC METABOLIC PANEL - Abnormal; Notable for the following components:      Result Value   Glucose, Bld 109 (*)    BUN 26 (*)    Creatinine, Ser 1.41 (*)    GFR calc non Af Amer 54 (*)    All other components within normal limits  CBC  TYPE AND SCREEN     IMAGES:   EKG: 05/03/2019 Rate 81 bpm  Sinus rhythm  RBBB Old inferior-apical infarct  CV: Echo 09/26/2017 Interpretation Summar A complete two-dimensional transthoracic echocardiogram with color flow Doppler  and spectral Doppler was performed.  The left ventricle is normal in size, wall thickness and wall motion with ejection fraction of 50-55%.  The left ventricular diastolic function is normal   Past Medical History:  Diagnosis Date  . Anxiety   . 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 Has left to right collaterals. Last cath in 2009 with stent to to LAD. EF 45 to 50%  . Depression   . DJD (degenerative joint disease)   . Hyperlipidemia   . Hypertension   . 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 Obesity   . OSA (obstructive sleep apnea) 03/01/2014   Severe with AHI 72.5 events/hr; no CPAP in use since weight loss   . Prostate cancer ([place])   . Sleep apnea   . Stress     Past Surgical History:  Procedure Laterality Date  . ANTERIOR CRUCIATE LIGAMENT REPAIR Right 1990  . CARDIAC CATHETERIZATION  10/09/2007   EF 45-50%  . CARDIOVASCULAR STRESS TEST  11/23/2008   EF 55%  . CORONARY STENT PLACEMENT     RIGHT CORONARY THAT SUBSEQUENTLY OCCLUDED.  [name] CORONARY STENT PLACEMENT     LAD WITH A 3.0X15MM PROMUS DRUG-ELUTING STENT PLACED.  [name] KNEE ARTHROPLASTY Left 10/23/2017   Procedure: LEFT TOTAL KNEE ARTHROPLASTY WITH COMPUTER NAVIGATION;  Surgeon: Rod Can, MD;  Location: [facility];  Service: Orthopedics;  Laterality: Left;  Adductor Block  . KNEE ARTHROSCOPY Left 1982  . TONSILLECTOMY    . US ECHOCARDIOGRAPHY  03/14/2003   EF 55-60%    MEDICATIONS: . allopurinol (ZYLOPRIM) 100 MG tablet  . aspirin 81 MG chewable tablet  . hydrochlorothiazide (HYDRODIURIL) 25 MG tablet  . lisinopril (PRINIVIL,ZESTRIL) 20 MG tablet  . meloxicam (MOBIC) 15 MG tablet  . metoprolol tartrate (LOPRESSOR) 25 MG tablet  . pravastatin (PRAVACHOL) 40 MG tablet   No current facility-administered medications for this encounter.    Maia Plan [facility] Pre-Surgical Testing [phone] 08/30/19  1:51 PM

## 2019-09-01 NOTE — H&P (Signed)
CC: Prostate Cancer   [name] [name] is a 59 year old gentleman who was found to have an elevated PSA of 9.6 prompting a TRUS biopsy of the prostate on 05/31/19 by Dr. Jeffie Pollock. This confirmed Gleason 3+4=7 adenocarcinoma of the prostate with 8 out of 12 biopsy cores positive for malignancy. He returns today after having made the decision that he is most interested in proceeding with surgical therapy. He is well informed and has undergone further discussion with Dr. Tammi Klippel regarding the option of radiation therapy as well.   Family history: None.   Imaging studies: None.   PMH: He has a history of coronary artery disease s/p MI and stent placement in 2005 and 2009, hypertension, gout, hyperlipidemia, sleep apnea (uses sleep apnea), and obesity. He is followed by cardiology in [place].  PSH: No abdominal surgeries.   TNM stage: cT1c Nx Mx  PSA: 9.6  Gleason score: 3+4=7 (Grade group 2)  Biopsy (05/31/19): 8/12 cores positive  Left: L lateral apex (90%, 3+3=6), L apex (90%, 3+4=7, PNI), L lateral mid (90%, 3+4=7), L mid (60%, 3+3=6), L lateral base (80%, 3+3=6, PNI), L base (50%, 3+3=6)  Right: R lateral apex (50%, 3+3=6), R lateral mid (50%, 3+3=6)  Prostate volume: 34.0 cc   Nomogram  OC disease: 42%  EPE: 56%  SVI: 9%  LNI: 8%  PFS (5 year, 10 year): 75%, 61%   Urinary function: IPSS is 2.  Erectile function: SHIM score is 22.     ALLERGIES: None   MEDICATIONS: Allopurinol  Hydrochlorothiazide 25 mg tablet  Lisinopril 20 mg tablet  Metoprolol Succinate 25 mg tablet, extended release 24 hr  Aspir 81 81 mg tablet, delayed release  Meloxicam  Pravastatin Sodium 40 mg tablet     GU PSH: Prostate Needle Biopsy - 05/31/2019       PSH Notes: L Knee torn ligaments  R knee ACL repair  Cardiac Stents   NON-GU PSH: Cardiac Stent Placement - about 2005, about 2001 Knee replacement, Left - 10/28/2017 Surgical Pathology, Gross And Microscopic Examination For Prostate Needle -  05/31/2019     GU PMH: Prostate Cancer - 06/08/2019 Elevated PSA - 05/31/2019, His PSA is up further but his exam is benign. I will have him set up for a prostate [place] and biopsy. THe risks of bleeding, infection and voiding difficulty reviewed. Levaquin sent. He is having blood drawn Friday and I will have a PSA done then. , - 04/28/2019, He has an elevated PSA with a benign prostate. I will repeat the PSA after a week of sexual abstinence and if it remains elevated I will schedule a prostate [place] and biopsy as soon after his upcoming knee surgery in early September as reasonable. If the PSA is falling, I will repeat it in 3 months. I reviewed the risks of the biopsy including bleeding, infection and voiding difficulty. , - 10/02/2017 BPH w/o LUTS, Small benign prostate on exam with an IPSS of 1. - 10/02/2017      PMH Notes: Heart Attack    NON-GU PMH: Coronary Artery Disease Gout Hypercholesterolemia Hypertension Myocardial Infarction Sleep Apnea    FAMILY HISTORY: 3 Son's - No Family History Lung Cancer - Father   SOCIAL HISTORY: Marital Status: Married Preferred Language: English; Ethnicity: Not Hispanic Or Latino; Race: White Current Smoking Status: Patient has never smoked.   Tobacco Use Assessment Completed: Used Tobacco in last 30 days? Does not use smokeless tobacco. Drinks 4 drinks per week. Types of alcohol consumed: Beer,  Liquor.  Does not use drugs. Drinks 3 caffeinated drinks per day. Has not had a blood transfusion. Patient's occupation Education officer, environmental.[name]    REVIEW OF SYSTEMS:    GU Review Male:   Patient denies frequent urination, hard to postpone urination, burning/ pain with urination, get up at night to urinate, leakage of urine, stream starts and stops, trouble starting your stream, have to strain to urinate , erection problems, and penile pain.  Gastrointestinal (Upper):   Patient denies nausea, vomiting, and indigestion/ heartburn.   Gastrointestinal (Lower):   Patient denies constipation and diarrhea.  Constitutional:   Patient denies fever, night sweats, weight loss, and fatigue.  Skin:   Patient denies skin rash/ lesion and itching.  Eyes:   Patient denies blurred vision and double vision.  Ears/ Nose/ Throat:   Patient denies sore throat and sinus problems.  Hematologic/Lymphatic:   Patient denies swollen glands and easy bruising.  Cardiovascular:   Patient denies leg swelling and chest pains.  Respiratory:   Patient denies cough and shortness of breath.  Endocrine:   Patient denies excessive thirst.  Musculoskeletal:   Patient denies back pain and joint pain.  Neurological:   Patient denies headaches and dizziness.  Psychologic:   Patient denies depression and anxiety.   VITAL SIGNS:     Weight 252 lb / 114.31 kg  Height 70.5 in / 179.07 cm BMI 35.6 kg/m   MULTI-SYSTEM PHYSICAL EXAMINATION:    Constitutional: Well-nourished. No physical deformities. Normally developed. Good grooming.  Respiratory: No labored breathing, no use of accessory muscles. Clear bilaterally.  Cardiovascular: Normal temperature, normal extremity pulses, no swelling, no varicosities. Regular rate and rhythm.  Gastrointestinal: No mass, no tenderness, no rigidity, obese with large reducible umbilical hernia (defect measures about 2.5 cm in diameter)     ASSESSMENT:      ICD-10 Details  1 GU:   Prostate Cancer - C61    PLAN:      1. Favorable intermediate risk prostate cancer: He has made the decision to proceed with surgical therapy. We discussed surgical therapy for prostate cancer including the different available surgical approaches. We discussed, in detail, the risks and expectations of surgery with regard to cancer control, urinary control, and erectile function as well as the expected postoperative recovery process. Additional risks of surgery including but not limited to bleeding, infection, hernia formation, nerve damage,  lymphocele formation, bowel/rectal injury potentially necessitating colostomy, damage to the urinary tract resulting in urine leakage, urethral stricture, and the cardiopulmonary risks such as myocardial infarction, stroke, death, venothromboembolism, etc. were explained. The risk of open surgical conversion for robotic/laparoscopic prostatectomy was also discussed.   We reviewed the recovery in appropriate expectations with regard to cancer control, urinary control, and erectile function. He will be scheduled for a bilateral nerve-sparing robot assisted laparoscopic radical prostatectomy and bilateral pelvic lymphadenectomy although final decisions regarding nerve sparing will be pending his preoperative MRI scheduled for June. He would like to proceed with surgery in July shortly after his son's wedding. He was reassured that this should be clinically appropriate considering his disease parameters.   2. Umbilical hernia: We discussed repair of his umbilical hernia at the time of his surgery. He does have a fairly large defect I would like to consult with General surgery as this likely would be best approach with a mesh repair. We did discuss some of the issues regarding doing that concomitantly with his prostate cancer surgery. Final recommendations will be pending that discussion.  I did speak with general surgery who recommended primary closure at the time of the operation.  They felt like mesh placement at this time would not be indicated due to risk of infection.

## 2019-09-02 ENCOUNTER — Encounter: Payer: Self-pay | Admitting: Medical Oncology

## 2019-09-02 ENCOUNTER — Other Ambulatory Visit: Payer: Self-pay

## 2019-09-02 ENCOUNTER — Observation Stay (HOSPITAL_COMMUNITY)
Admission: RE | Admit: 2019-09-02 | Discharge: 2019-09-03 | Disposition: A | Discharge status: Home / Self Care | Payer: 59 | Source: Ambulatory Visit | Attending: Urology | Admitting: Urology

## 2019-09-02 ENCOUNTER — Ambulatory Visit (HOSPITAL_COMMUNITY): Payer: 59 | Admitting: Certified Registered"

## 2019-09-02 ENCOUNTER — Encounter (HOSPITAL_COMMUNITY)
Admission: RE | Disposition: A | Discharge status: Home / Self Care | Payer: Self-pay | Source: Ambulatory Visit | Attending: Urology

## 2019-09-02 ENCOUNTER — Ambulatory Visit (HOSPITAL_COMMUNITY): Payer: 59 | Admitting: Physician Assistant

## 2019-09-02 ENCOUNTER — Encounter (HOSPITAL_COMMUNITY): Payer: Self-pay | Admitting: Urology

## 2019-09-02 DIAGNOSIS — I2510 Atherosclerotic heart disease of native coronary artery without angina pectoris: Secondary | ICD-10-CM | POA: Diagnosis not present

## 2019-09-02 DIAGNOSIS — Z955 Presence of coronary angioplasty implant and graft: Secondary | ICD-10-CM | POA: Diagnosis not present

## 2019-09-02 DIAGNOSIS — Z96652 Presence of left artificial knee joint: Secondary | ICD-10-CM | POA: Insufficient documentation

## 2019-09-02 DIAGNOSIS — I10 Essential (primary) hypertension: Secondary | ICD-10-CM | POA: Insufficient documentation

## 2019-09-02 DIAGNOSIS — G4730 Sleep apnea, unspecified: Secondary | ICD-10-CM | POA: Diagnosis not present

## 2019-09-02 DIAGNOSIS — C61 Malignant neoplasm of prostate: Secondary | ICD-10-CM | POA: Diagnosis not present

## 2019-09-02 DIAGNOSIS — Z79899 Other long term (current) drug therapy: Secondary | ICD-10-CM | POA: Insufficient documentation

## 2019-09-02 DIAGNOSIS — M109 Gout, unspecified: Secondary | ICD-10-CM | POA: Diagnosis not present

## 2019-09-02 DIAGNOSIS — I252 Old myocardial infarction: Secondary | ICD-10-CM | POA: Insufficient documentation

## 2019-09-02 DIAGNOSIS — E785 Hyperlipidemia, unspecified: Secondary | ICD-10-CM | POA: Insufficient documentation

## 2019-09-02 HISTORY — PX: ROBOT ASSISTED LAPAROSCOPIC RADICAL PROSTATECTOMY: SHX5141

## 2019-09-02 HISTORY — PX: LYMPHADENECTOMY: SHX5960

## 2019-09-02 HISTORY — PX: INGUINAL HERNIA REPAIR: SHX194

## 2019-09-02 LAB — TYPE AND SCREEN
ABO/RH(D): O POS
Antibody Screen: NEGATIVE

## 2019-09-02 LAB — HEMOGLOBIN AND HEMATOCRIT, BLOOD
HCT: 46.3 % (ref 39.0–52.0)
Hemoglobin: 15.3 g/dL (ref 13.0–17.0)

## 2019-09-02 SURGERY — XI ROBOTIC ASSISTED LAPAROSCOPIC RADICAL PROSTATECTOMY LEVEL 2
Anesthesia: General

## 2019-09-02 MED ORDER — KCL IN DEXTROSE-NACL 20-5-0.45 MEQ/L-%-% IV SOLN
INTRAVENOUS | Status: DC
  Filled 2019-09-02 (×3): qty 1000

## 2019-09-02 MED ORDER — MIDAZOLAM HCL 2 MG/2ML IJ SOLN: 0.5000 mg | Freq: Once | INTRAMUSCULAR | Status: DC | PRN

## 2019-09-02 MED ORDER — SULFAMETHOXAZOLE-TRIMETHOPRIM 800-160 MG PO TABS
1.0000 | ORAL_TABLET | Freq: Two times a day (BID) | ORAL | 0 refills | Status: DC
Start: 2019-09-02 — End: 2021-09-06

## 2019-09-02 MED ORDER — ALLOPURINOL 100 MG PO TABS
100.0000 mg | ORAL_TABLET | Freq: Every day | ORAL | Status: DC
  Administered 2019-09-03: 100 mg via ORAL
  Filled 2019-09-02: qty 1

## 2019-09-02 MED ORDER — FLEET ENEMA 7-19 GM/118ML RE ENEM
1.0000 | ENEMA | Freq: Once | RECTAL | Status: DC
  Filled 2019-09-02: qty 1

## 2019-09-02 MED ORDER — BUPIVACAINE-EPINEPHRINE 0.25% -1:200000 IJ SOLN
INTRAMUSCULAR | Status: DC | PRN
  Administered 2019-09-02: 30 mL

## 2019-09-02 MED ORDER — ASPIRIN 81 MG PO CHEW
81.0000 mg | CHEWABLE_TABLET | Freq: Every day | ORAL | Status: DC
  Administered 2019-09-03: 81 mg via ORAL
  Filled 2019-09-02: qty 1

## 2019-09-02 MED ORDER — FENTANYL CITRATE (PF) 250 MCG/5ML IJ SOLN
INTRAMUSCULAR | Status: DC | PRN
  Administered 2019-09-02: 50 ug via INTRAVENOUS
  Administered 2019-09-02: 100 ug via INTRAVENOUS
  Administered 2019-09-02: 50 ug via INTRAVENOUS

## 2019-09-02 MED ORDER — ROCURONIUM BROMIDE 10 MG/ML (PF) SYRINGE
PREFILLED_SYRINGE | INTRAVENOUS | Status: AC
  Filled 2019-09-02: qty 10

## 2019-09-02 MED ORDER — MORPHINE SULFATE (PF) 2 MG/ML IV SOLN
2.0000 mg | INTRAVENOUS | Status: DC | PRN
  Administered 2019-09-02: 2 mg via INTRAVENOUS
  Filled 2019-09-02: qty 1

## 2019-09-02 MED ORDER — BELLADONNA ALKALOIDS-OPIUM 16.2-60 MG RE SUPP: 1.0000 | Freq: Four times a day (QID) | RECTAL | Status: DC | PRN

## 2019-09-02 MED ORDER — MIDAZOLAM HCL 2 MG/2ML IJ SOLN
INTRAMUSCULAR | Status: DC | PRN
  Administered 2019-09-02: 2 mg via INTRAVENOUS

## 2019-09-02 MED ORDER — PROMETHAZINE HCL 25 MG/ML IJ SOLN: 6.2500 mg | INTRAMUSCULAR | Status: DC | PRN

## 2019-09-02 MED ORDER — DIPHENHYDRAMINE HCL 50 MG/ML IJ SOLN: 12.5000 mg | Freq: Four times a day (QID) | INTRAMUSCULAR | Status: DC | PRN

## 2019-09-02 MED ORDER — MIDAZOLAM HCL 2 MG/2ML IJ SOLN
INTRAMUSCULAR | Status: AC
  Filled 2019-09-02: qty 2

## 2019-09-02 MED ORDER — PROPOFOL 10 MG/ML IV BOLUS
INTRAVENOUS | Status: DC | PRN
  Administered 2019-09-02: 140 mg via INTRAVENOUS

## 2019-09-02 MED ORDER — ORAL CARE MOUTH RINSE: 15.0000 mL | Freq: Once | OROMUCOSAL | Status: AC

## 2019-09-02 MED ORDER — FENTANYL CITRATE (PF) 100 MCG/2ML IJ SOLN
INTRAMUSCULAR | Status: AC
  Filled 2019-09-02: qty 2

## 2019-09-02 MED ORDER — ROCURONIUM BROMIDE 10 MG/ML (PF) SYRINGE
PREFILLED_SYRINGE | INTRAVENOUS | Status: DC | PRN
  Administered 2019-09-02: 10 mg via INTRAVENOUS
  Administered 2019-09-02: 60 mg via INTRAVENOUS
  Administered 2019-09-02 (×2): 20 mg via INTRAVENOUS

## 2019-09-02 MED ORDER — SODIUM CHLORIDE 0.9 % IR SOLN
Status: DC | PRN
  Administered 2019-09-02: 1000 mL via INTRAVESICAL

## 2019-09-02 MED ORDER — GLYCOPYRROLATE PF 0.2 MG/ML IJ SOSY
PREFILLED_SYRINGE | INTRAMUSCULAR | Status: AC
  Filled 2019-09-02: qty 1

## 2019-09-02 MED ORDER — LIDOCAINE 2% (20 MG/ML) 5 ML SYRINGE
INTRAMUSCULAR | Status: AC
  Filled 2019-09-02: qty 5

## 2019-09-02 MED ORDER — OXYCODONE HCL 5 MG PO TABS: 5.0000 mg | ORAL_TABLET | Freq: Once | ORAL | Status: DC | PRN

## 2019-09-02 MED ORDER — DOCUSATE SODIUM 100 MG PO CAPS
100.0000 mg | ORAL_CAPSULE | Freq: Two times a day (BID) | ORAL | Status: DC
  Administered 2019-09-02 – 2019-09-03 (×2): 100 mg via ORAL
  Filled 2019-09-02 (×2): qty 1

## 2019-09-02 MED ORDER — ONDANSETRON HCL 4 MG/2ML IJ SOLN
INTRAMUSCULAR | Status: DC | PRN
  Administered 2019-09-02: 4 mg via INTRAVENOUS

## 2019-09-02 MED ORDER — DIPHENHYDRAMINE HCL 12.5 MG/5ML PO ELIX: 12.5000 mg | ORAL_SOLUTION | Freq: Four times a day (QID) | ORAL | Status: DC | PRN

## 2019-09-02 MED ORDER — BUPIVACAINE-EPINEPHRINE (PF) 0.25% -1:200000 IJ SOLN
INTRAMUSCULAR | Status: AC
  Filled 2019-09-02: qty 30

## 2019-09-02 MED ORDER — ACETAMINOPHEN 325 MG PO TABS
650.0000 mg | ORAL_TABLET | ORAL | Status: DC | PRN
  Filled 2019-09-02: qty 2

## 2019-09-02 MED ORDER — HYDROMORPHONE HCL 2 MG/ML IJ SOLN
INTRAMUSCULAR | Status: AC
  Filled 2019-09-02: qty 1

## 2019-09-02 MED ORDER — BACITRACIN-NEOMYCIN-POLYMYXIN 400-5-5000 EX OINT: 1.0000 "application " | TOPICAL_OINTMENT | Freq: Three times a day (TID) | CUTANEOUS | Status: DC | PRN

## 2019-09-02 MED ORDER — SCOPOLAMINE 1 MG/3DAYS TD PT72
1.0000 | MEDICATED_PATCH | TRANSDERMAL | Status: DC
  Filled 2019-09-02: qty 1

## 2019-09-02 MED ORDER — ACETAMINOPHEN 500 MG PO TABS: 1000.0000 mg | ORAL_TABLET | Freq: Once | ORAL | Status: DC

## 2019-09-02 MED ORDER — PHENYLEPHRINE 40 MCG/ML (10ML) SYRINGE FOR IV PUSH (FOR BLOOD PRESSURE SUPPORT)
PREFILLED_SYRINGE | INTRAVENOUS | Status: AC
  Filled 2019-09-02: qty 10

## 2019-09-02 MED ORDER — SUGAMMADEX SODIUM 500 MG/5ML IV SOLN
INTRAVENOUS | Status: AC
  Filled 2019-09-02: qty 5

## 2019-09-02 MED ORDER — LACTATED RINGERS IV SOLN
INTRAVENOUS | Status: DC
Start: 2019-09-02 — End: 2019-09-02

## 2019-09-02 MED ORDER — MEPERIDINE HCL 50 MG/ML IJ SOLN: 6.2500 mg | INTRAMUSCULAR | Status: DC | PRN

## 2019-09-02 MED ORDER — ZOLPIDEM TARTRATE 5 MG PO TABS: 5.0000 mg | ORAL_TABLET | Freq: Every evening | ORAL | Status: DC | PRN

## 2019-09-02 MED ORDER — LACTATED RINGERS IV SOLN
INTRAVENOUS | Status: DC | PRN
  Administered 2019-09-02: 1000 mL

## 2019-09-02 MED ORDER — METOPROLOL TARTRATE 25 MG PO TABS
25.0000 mg | ORAL_TABLET | Freq: Every day | ORAL | Status: DC
  Administered 2019-09-03: 25 mg via ORAL
  Filled 2019-09-02: qty 1

## 2019-09-02 MED ORDER — PRAVASTATIN SODIUM 40 MG PO TABS
40.0000 mg | ORAL_TABLET | Freq: Every day | ORAL | Status: DC
  Administered 2019-09-03: 40 mg via ORAL
  Filled 2019-09-02: qty 1

## 2019-09-02 MED ORDER — CEFAZOLIN SODIUM-DEXTROSE 1-4 GM/50ML-% IV SOLN
1.0000 g | Freq: Three times a day (TID) | INTRAVENOUS | Status: AC
  Administered 2019-09-02 – 2019-09-03 (×2): 1 g via INTRAVENOUS
  Filled 2019-09-02 (×2): qty 50

## 2019-09-02 MED ORDER — TRAMADOL HCL 50 MG PO TABS: 50.0000 mg | ORAL_TABLET | Freq: Four times a day (QID) | ORAL | 0 refills | Status: DC | PRN

## 2019-09-02 MED ORDER — ONDANSETRON HCL 4 MG/2ML IJ SOLN: 4.0000 mg | INTRAMUSCULAR | Status: DC | PRN

## 2019-09-02 MED ORDER — GLYCOPYRROLATE 0.2 MG/ML IJ SOLN
INTRAMUSCULAR | Status: DC | PRN
Start: 2019-09-02 — End: 2019-09-02
  Administered 2019-09-02: .2 mg via INTRAVENOUS

## 2019-09-02 MED ORDER — SUGAMMADEX SODIUM 500 MG/5ML IV SOLN
INTRAVENOUS | Status: DC | PRN
Start: 2019-09-02 — End: 2019-09-02
  Administered 2019-09-02: 250 mg via INTRAVENOUS

## 2019-09-02 MED ORDER — DEXAMETHASONE SODIUM PHOSPHATE 10 MG/ML IJ SOLN
INTRAMUSCULAR | Status: AC
  Filled 2019-09-02: qty 1

## 2019-09-02 MED ORDER — MAGNESIUM CITRATE PO SOLN
1.0000 | Freq: Once | ORAL | Status: DC
  Filled 2019-09-02: qty 296

## 2019-09-02 MED ORDER — SODIUM CHLORIDE 0.9 % IV BOLUS: 1000.0000 mL | Freq: Once | INTRAVENOUS | Status: DC

## 2019-09-02 MED ORDER — HYDROMORPHONE HCL 1 MG/ML IJ SOLN: 0.2500 mg | INTRAMUSCULAR | Status: DC | PRN

## 2019-09-02 MED ORDER — HYDROCHLOROTHIAZIDE 25 MG PO TABS
25.0000 mg | ORAL_TABLET | Freq: Every day | ORAL | Status: DC
  Administered 2019-09-03: 25 mg via ORAL
  Filled 2019-09-02: qty 1

## 2019-09-02 MED ORDER — EPHEDRINE 5 MG/ML INJ
INTRAVENOUS | Status: AC
  Filled 2019-09-02: qty 10

## 2019-09-02 MED ORDER — DEXAMETHASONE SODIUM PHOSPHATE 10 MG/ML IJ SOLN
INTRAMUSCULAR | Status: DC | PRN
  Administered 2019-09-02: 8 mg via INTRAVENOUS

## 2019-09-02 MED ORDER — CHLORHEXIDINE GLUCONATE 0.12 % MT SOLN
15.0000 mL | Freq: Once | OROMUCOSAL | Status: AC
  Administered 2019-09-02: 15 mL via OROMUCOSAL

## 2019-09-02 MED ORDER — HYDROMORPHONE HCL 1 MG/ML IJ SOLN
INTRAMUSCULAR | Status: DC | PRN
  Administered 2019-09-02 (×4): 1 mg via INTRAVENOUS

## 2019-09-02 MED ORDER — OXYCODONE HCL 5 MG/5ML PO SOLN: 5.0000 mg | Freq: Once | ORAL | Status: DC | PRN

## 2019-09-02 MED ORDER — CEFAZOLIN SODIUM-DEXTROSE 2-4 GM/100ML-% IV SOLN
2.0000 g | Freq: Once | INTRAVENOUS | Status: AC
  Administered 2019-09-02: 2 g via INTRAVENOUS
  Filled 2019-09-02: qty 100

## 2019-09-02 MED ORDER — PROPOFOL 10 MG/ML IV BOLUS
INTRAVENOUS | Status: AC
  Filled 2019-09-02: qty 20

## 2019-09-02 MED ORDER — KETOROLAC TROMETHAMINE 15 MG/ML IJ SOLN
15.0000 mg | Freq: Four times a day (QID) | INTRAMUSCULAR | Status: DC
  Administered 2019-09-02 – 2019-09-03 (×3): 15 mg via INTRAVENOUS
  Filled 2019-09-02 (×2): qty 1

## 2019-09-02 MED ORDER — EPHEDRINE SULFATE-NACL 50-0.9 MG/10ML-% IV SOSY
PREFILLED_SYRINGE | INTRAVENOUS | Status: DC | PRN
  Administered 2019-09-02 (×2): 5 mg via INTRAVENOUS

## 2019-09-02 MED ORDER — STERILE WATER FOR IRRIGATION IR SOLN
Status: DC | PRN
  Administered 2019-09-02: 1000 mL

## 2019-09-02 MED ORDER — HEPARIN SODIUM (PORCINE) 1000 UNIT/ML IJ SOLN
INTRAMUSCULAR | Status: AC
  Filled 2019-09-02: qty 1

## 2019-09-02 MED ORDER — ONDANSETRON HCL 4 MG/2ML IJ SOLN
INTRAMUSCULAR | Status: AC
  Filled 2019-09-02: qty 2

## 2019-09-02 MED ORDER — LIDOCAINE 2% (20 MG/ML) 5 ML SYRINGE
INTRAMUSCULAR | Status: DC | PRN
  Administered 2019-09-02: 40 mg via INTRAVENOUS

## 2019-09-02 SURGICAL SUPPLY — 60 items
APPLICATOR COTTON TIP 6 STRL (MISCELLANEOUS) ×2 IMPLANT
APPLICATOR COTTON TIP 6IN STRL (MISCELLANEOUS) ×4
CATH FOLEY 2WAY SLVR 18FR 30CC (CATHETERS) ×4 IMPLANT
CATH ROBINSON RED A/P 16FR (CATHETERS) ×4 IMPLANT
CATH ROBINSON RED A/P 8FR (CATHETERS) ×4 IMPLANT
CATH TIEMANN FOLEY 18FR 5CC (CATHETERS) ×4 IMPLANT
CHLORAPREP W/TINT 26 (MISCELLANEOUS) ×4 IMPLANT
CLIP VESOLOCK LG 6/CT PURPLE (CLIP) ×8 IMPLANT
COVER SURGICAL LIGHT HANDLE (MISCELLANEOUS) ×4 IMPLANT
COVER TIP SHEARS 8 DVNC (MISCELLANEOUS) ×2 IMPLANT
COVER TIP SHEARS 8MM DA VINCI (MISCELLANEOUS) ×4
COVER WAND RF STERILE (DRAPES) IMPLANT
CUTTER ECHEON FLEX ENDO 45 340 (ENDOMECHANICALS) ×4 IMPLANT
DECANTER SPIKE VIAL GLASS SM (MISCELLANEOUS) ×4 IMPLANT
DERMABOND ADVANCED (GAUZE/BANDAGES/DRESSINGS) ×2
DERMABOND ADVANCED .7 DNX12 (GAUZE/BANDAGES/DRESSINGS) ×2 IMPLANT
DRAIN CHANNEL RND F F (WOUND CARE) ×4 IMPLANT
DRAPE ARM DVNC X/XI (DISPOSABLE) ×8 IMPLANT
DRAPE COLUMN DVNC XI (DISPOSABLE) ×2 IMPLANT
DRAPE DA VINCI XI ARM (DISPOSABLE) ×16
DRAPE DA VINCI XI COLUMN (DISPOSABLE) ×4
DRAPE SURG IRRIG POUCH 19X23 (DRAPES) ×4 IMPLANT
DRSG TEGADERM 4X4.75 (GAUZE/BANDAGES/DRESSINGS) ×4 IMPLANT
ELECT REM PT RETURN 15FT ADLT (MISCELLANEOUS) ×4 IMPLANT
GLOVE BIO SURGEON STRL SZ 6.5 (GLOVE) ×3 IMPLANT
GLOVE BIO SURGEONS STRL SZ 6.5 (GLOVE) ×1
GLOVE BIOGEL M STRL SZ7.5 (GLOVE) ×8 IMPLANT
GOWN STRL REUS W/TWL LRG LVL3 (GOWN DISPOSABLE) ×12 IMPLANT
HOLDER FOLEY CATH W/STRAP (MISCELLANEOUS) ×4 IMPLANT
IRRIG SUCT STRYKERFLOW 2 WTIP (MISCELLANEOUS) ×4
IRRIGATION SUCT STRKRFLW 2 WTP (MISCELLANEOUS) ×2 IMPLANT
IV LACTATED RINGERS 1000ML (IV SOLUTION) ×4 IMPLANT
KIT TURNOVER KIT A (KITS) IMPLANT
NDL SAFETY ECLIPSE 18X1.5 (NEEDLE) ×2 IMPLANT
NEEDLE HYPO 18GX1.5 SHARP (NEEDLE) ×4
PACK ROBOT UROLOGY CUSTOM (CUSTOM PROCEDURE TRAY) ×4 IMPLANT
PENCIL SMOKE EVACUATOR (MISCELLANEOUS) IMPLANT
SEAL CANN UNIV 5-8 DVNC XI (MISCELLANEOUS) ×8 IMPLANT
SEAL XI 5MM-8MM UNIVERSAL (MISCELLANEOUS) ×16
SET IRRIG Y TYPE TUR BLADDER L (SET/KITS/TRAYS/PACK) ×4 IMPLANT
SET TUBE SMOKE EVAC HIGH FLOW (TUBING) ×4 IMPLANT
SOLUTION ELECTROLUBE (MISCELLANEOUS) ×4 IMPLANT
STAPLE RELOAD 45 GRN (STAPLE) ×2 IMPLANT
STAPLE RELOAD 45MM GREEN (STAPLE) ×4
SUT ETHILON 3 0 PS 1 (SUTURE) ×4 IMPLANT
SUT MNCRL 3 0 RB1 (SUTURE) ×2 IMPLANT
SUT MNCRL 3 0 VIOLET RB1 (SUTURE) ×2 IMPLANT
SUT MNCRL AB 4-0 PS2 18 (SUTURE) ×8 IMPLANT
SUT MONOCRYL 3 0 RB1 (SUTURE) ×8
SUT NOVA NAB DX-16 0-1 5-0 T12 (SUTURE) ×8 IMPLANT
SUT VIC AB 0 CT1 27 (SUTURE) ×4
SUT VIC AB 0 CT1 27XBRD ANTBC (SUTURE) ×2 IMPLANT
SUT VIC AB 0 UR5 27 (SUTURE) ×4 IMPLANT
SUT VIC AB 2-0 SH 27 (SUTURE) ×4
SUT VIC AB 2-0 SH 27X BRD (SUTURE) ×2 IMPLANT
SUT VICRYL 0 UR6 27IN ABS (SUTURE) ×8 IMPLANT
SYR 27GX1/2 1ML LL SAFETY (SYRINGE) ×4 IMPLANT
TOWEL OR NON WOVEN STRL DISP B (DISPOSABLE) ×4 IMPLANT
TROCAR XCEL NON-BLD 5MMX100MML (ENDOMECHANICALS) IMPLANT
WATER STERILE IRR 1000ML POUR (IV SOLUTION) ×4 IMPLANT

## 2019-09-02 NOTE — Discharge Instructions (Signed)

## 2019-09-02 NOTE — Transfer of Care (Signed)
Immediate Anesthesia Transfer of Care Note  Patient: VYNCENT OVERBY  Procedure(s) Performed: XI ROBOTIC ASSISTED LAPAROSCOPIC RADICAL PROSTATECTOMY LEVEL 2 (N/A ) LYMPHADENECTOMY, PELVIC (Bilateral ) HERNIA REPAIR UMBILICAL ,ADULT (N/A )  Patient Location: PACU  Anesthesia Type:General  Level of Consciousness: drowsy, patient cooperative and responds to stimulation  Airway & Oxygen Therapy: Patient Spontanous Breathing and Patient connected to face mask oxygen  Post-op Assessment: Report given to RN and Post -op Vital signs reviewed and stable  Post vital signs: Reviewed and stable  Last Vitals:  Vitals Value Taken Time  BP    Temp    Pulse    Resp    SpO2      Last Pain:  Vitals:   09/02/19 1001  TempSrc:   PainSc: 2       Patients Stated Pain Goal: 4 (20/94/70 9628)  Complications: No complications documented.

## 2019-09-02 NOTE — Anesthesia Procedure Notes (Signed)
Procedure Name: Intubation Date/Time: 09/02/2019 12:38 PM Performed by: Eben Burow, CRNA Pre-anesthesia Checklist: Patient identified, Emergency Drugs available, Suction available, Patient being monitored and Timeout performed Patient Re-evaluated:Patient Re-evaluated prior to induction Oxygen Delivery Method: Circle system utilized Preoxygenation: Pre-oxygenation with 100% oxygen Induction Type: IV induction Ventilation: Mask ventilation without difficulty Laryngoscope Size: Glidescope and 4 Grade View: Grade I Tube type: Oral Tube size: 7.5 mm Number of attempts: 1 Airway Equipment and Method: Stylet Placement Confirmation: ETT inserted through vocal cords under direct vision,  positive ETCO2 and breath sounds checked- equal and bilateral Secured at: 23 cm Tube secured with: Tape Dental Injury: Teeth and Oropharynx as per pre-operative assessment  Comments: Glidescope used due to poor dentition

## 2019-09-02 NOTE — Anesthesia Postprocedure Evaluation (Signed)
Anesthesia Post Note  Patient: [name] [name]  Procedure(s) Performed: XI ROBOTIC ASSISTED LAPAROSCOPIC RADICAL PROSTATECTOMY LEVEL 2 (N/A ) LYMPHADENECTOMY, PELVIC (Bilateral ) HERNIA REPAIR UMBILICAL ,ADULT (N/A )     Patient location during evaluation: PACU Anesthesia Type: General Level of consciousness: awake and alert, oriented and patient cooperative Pain management: pain level controlled Vital Signs Assessment: post-procedure vital signs reviewed and stable Respiratory status: spontaneous breathing, nonlabored ventilation and respiratory function stable Cardiovascular status: blood pressure returned to baseline and stable Postop Assessment: no apparent nausea or vomiting Anesthetic complications: no   No complications documented.  Last Vitals:  Vitals:   09/02/19 1600 09/02/19 1616  BP: (!) 162/90 (!) 146/79  Pulse: 78 77  Resp: 13 19  Temp: 36.7 C   SpO2: 100% 94%    Last Pain:  Vitals:   09/02/19 1600  TempSrc:   PainSc: 0-No pain                 Yassmine Tamm,E. Sheera Illingworth

## 2019-09-02 NOTE — Progress Notes (Signed)
Patient ID: [name] [name], male   DOB: Jul 16, 1960, 59 y.o.   MRN: [id number]  Post-op note  Subjective: The patient is doing well.  No complaints.  Objective: Vital signs in last 24 hours: Temp:  [97.5 F (36.4 C)-98.2 F (36.8 C)] 97.5 F (36.4 C) (07/15 1732) Pulse Rate:  [63-80] 80 (07/15 1732) Resp:  [13-23] 23 (07/15 1732) BP: (130-162)/(73-90) 149/85 (07/15 1732) SpO2:  [94 %-100 %] 96 % (07/15 1732) Weight:  [481 kg] 112 kg (07/15 1001)  Intake/Output from previous day: No intake/output data recorded. Intake/Output this shift: Total I/O In: 2874 [P.O.:60; I.V.:2714; IV Piggyback:100] Out: 235 [Urine:75; Drains:80; Blood:80]  Physical Exam:  General: Alert and oriented. Abdomen: Soft, Nondistended. Incisions: Clean and dry. GU: Urine clear.  Lab Results: Recent Labs    09/02/19 1615  HGB 15.3  HCT 46.3    Assessment/Plan: POD#0   1) Continue to monitor, ambulate, IS   Pryor Curia. MD   LOS: 0 days   [name] [name] 09/02/2019, 5:42 PM

## 2019-09-02 NOTE — Op Note (Addendum)
Preoperative diagnosis: Clinically localized adenocarcinoma of the prostate (clinical stage K2H N0 M0), umbilical hernia  Postoperative diagnosis: Clinically localized adenocarcinoma of the prostate (clinical stage C6C N0 M0), umbilical hernia  Procedure:  1. Robotic assisted laparoscopic radical prostatectomy (right nerve sparing) 2. Bilateral robotic assisted laparoscopic pelvic lymphadenectomy 3. Umbilical hernia repair  Surgeon: Pryor Curia. M.D.  Assistant(s): Debbrah Alar, PA-C  An assistant was required for this surgical procedure.  The duties of the assistant included but were not limited to suctioning, passing suture, camera manipulation, retraction. This procedure would not be able to be performed without an Environmental consultant.   Anesthesia: General  Complications: None  EBL: 80 mL  IVF:  2500 mL crystalloid  Specimens: 1. Prostate and seminal vesicles 2. Right pelvic lymph nodes 3. Left pelvic lymph nodes  Disposition of specimens: Pathology  Drains: 1. 20 Fr coude catheter 2. # 19 Blake pelvic drain  Indication: [name] [name] is a 59 y.o. patient with clinically localized prostate cancer and an umbilical hernia.  After a thorough review of the management options for treatment of prostate cancer, he elected to proceed with surgical therapy and the above procedure(s).  We have discussed the potential benefits and risks of the procedure, side effects of the proposed treatment, the likelihood of the patient achieving the goals of the procedure, and any potential problems that might occur during the procedure or recuperation. Informed consent has been obtained.  Description of procedure:  The patient was taken to the operating room and a general anesthetic was administered. He was given preoperative antibiotics, placed in the dorsal lithotomy position, and prepped and draped in the usual sterile fashion. Next a preoperative timeout was performed. A urethral catheter  was placed into the bladder and a site was selected near the umbilicus for placement of the camera port. This was placed using a standard open Hassan technique which allowed entry into the peritoneal cavity under direct vision and without difficulty. An 8 mm port was placed and a pneumoperitoneum established. The camera was then used to inspect the abdomen and there was no evidence of any intra-abdominal injuries or other abnormalities except for some omental adhesions noted in the patient's umbilical hernia defect. The remaining abdominal ports were then placed. 8 mm robotic ports were placed in the right lower quadrant, left lower quadrant, and far left lateral abdominal wall. A 5 mm port was placed in the right upper quadrant and a 12 mm port was placed in the right lateral abdominal wall for laparoscopic assistance. All ports were placed under direct vision without difficulty. The surgical cart was then docked.   Utilizing the cautery scissors, the bladder was reflected posteriorly allowing entry into the space of Retzius and identification of the endopelvic fascia and prostate. The periprostatic fat was then removed from the prostate allowing full exposure of the endopelvic fascia. The endopelvic fascia was then incised from the apex back to the base of the prostate bilaterally and the underlying levator muscle fibers were swept laterally off the prostate thereby isolating the dorsal venous complex. The dorsal vein was then stapled and divided with a 45 mm Flex Echelon stapler. Attention then turned to the bladder neck which was divided anteriorly thereby allowing entry into the bladder and exposure of the urethral catheter. The catheter balloon was deflated and the catheter was brought into the operative field and used to retract the prostate anteriorly. The posterior bladder neck was then examined and was divided allowing further dissection between the  bladder and prostate posteriorly until the vasa  deferentia and seminal vessels were identified. The vasa deferentia were isolated, divided, and lifted anteriorly. The seminal vesicles were dissected down to their tips with care to control the seminal vascular arterial blood supply. These structures were then lifted anteriorly and the space between Denonvillier's fascia and the anterior rectum was developed with a combination of sharp and blunt dissection. This isolated the vascular pedicles of the prostate.  The lateral prostatic fascia on the right side of the prostate was then sharply incised allowing release of the neurovascular bundle. The vascular pedicle of the prostate on the right side was then ligated with Weck clips between the prostate and neurovascular bundle and divided with sharp cold scissor dissection resulting in neurovascular bundle preservation. On the left side, a wide non nerve sparing dissection was performed with Weck clips used to ligate the vascular pedicle of the prostate. The neurovascular bundle on the right side was then separated off the apex of the prostate and urethra.  The urethra was then sharply transected allowing the prostate specimen to be disarticulated. The pelvis was copiously irrigated and hemostasis was ensured. There was no evidence for rectal injury.  Attention then turned to the right pelvic sidewall. The fibrofatty tissue between the external iliac vein, confluence of the iliac vessels, hypogastric artery, and Cooper's ligament was dissected free from the pelvic sidewall with care to preserve the obturator nerve. Weck clips were used for lymphostasis and hemostasis. An identical procedure was performed on the contralateral side and the lymphatic packets were removed for permanent pathologic analysis.  Attention then turned to the urethral anastomosis. A 2-0 Vicryl slip knot was placed between Denonvillier's fascia, the posterior bladder neck, and the posterior urethra to reapproximate these structures. A  double-armed 3-0 Monocryl suture was then used to perform a 360 running tension-free anastomosis between the bladder neck and urethra. A new urethral catheter was then placed into the bladder and irrigated. There were no blood clots within the bladder and the anastomosis appeared to be watertight. A #19 Blake drain was then brought through the left lateral 8 mm port site and positioned appropriately within the pelvis. It was secured to the skin with a nylon suture. The surgical cart was then undocked. The right lateral 12 mm port site was closed at the fascial level with a 0 Vicryl suture placed laparoscopically. All remaining ports were then removed under direct vision. The prostate specimen was removed intact within the Endopouch retrieval bag via the periumbilical camera port site. This fascial opening was then extended in a periumbilical fashion around the patient's umbilical hernia defect which measured about 2.5 cm by 2.5 cm.  This incision was extended to include the hernia defect which was excised. The fascial defect was then closed with interrupted 1-0 Novofil figure of 8 sutures. 0.25% Marcaine was then injected into all port sites and all incisions were reapproximated at the skin level with 4-0 Monocryl subcuticular sutures. Dermabond was applied. The patient appeared to tolerate the procedure well and without complications. The patient was able to be extubated and transferred to the recovery unit in satisfactory condition.   Pryor Curia MD

## 2019-09-03 ENCOUNTER — Encounter (HOSPITAL_COMMUNITY): Payer: Self-pay | Admitting: Urology

## 2019-09-03 DIAGNOSIS — C61 Malignant neoplasm of prostate: Secondary | ICD-10-CM | POA: Diagnosis not present

## 2019-09-03 LAB — HEMOGLOBIN AND HEMATOCRIT, BLOOD
HCT: 39.3 % (ref 39.0–52.0)
HCT: 40.8 % (ref 39.0–52.0)
Hemoglobin: 13.4 g/dL (ref 13.0–17.0)
Hemoglobin: 14.1 g/dL (ref 13.0–17.0)

## 2019-09-03 MED ORDER — CHLORHEXIDINE GLUCONATE CLOTH 2 % EX PADS
6.0000 | MEDICATED_PAD | Freq: Every day | CUTANEOUS | Status: DC
  Administered 2019-09-03: 6 via TOPICAL

## 2019-09-03 MED ORDER — BISACODYL 10 MG RE SUPP
10.0000 mg | Freq: Once | RECTAL | Status: DC
  Filled 2019-09-03: qty 1

## 2019-09-03 MED ORDER — TRAMADOL HCL 50 MG PO TABS: 50.0000 mg | ORAL_TABLET | Freq: Four times a day (QID) | ORAL | Status: DC | PRN

## 2019-09-03 NOTE — Progress Notes (Signed)
Patient ID: [name] [name], male   DOB: Jul 03, 1960, 59 y.o.   MRN: [id number]  1 Day Post-Op Subjective: The patient is doing well.  No nausea or vomiting. Pain is adequately controlled. Some seepage from drain site.  Objective: Vital signs in last 24 hours: Temp:  [97.5 F (36.4 C)-98.2 F (36.8 C)] 97.9 F (36.6 C) (07/16 0210) Pulse Rate:  [63-80] 69 (07/16 0210) Resp:  [13-23] 20 (07/16 0210) BP: (130-162)/(73-90) 134/80 (07/16 0210) SpO2:  [94 %-100 %] 98 % (07/16 0210) Weight:  [633 kg] 112 kg (07/15 1001)  Intake/Output from previous day: 07/15 0701 - 07/16 0700 In: 4910.2 [P.O.:540; I.V.:4170.2; IV Piggyback:200] Out: 2067 [Urine:1825; Drains:162; Blood:80] Intake/Output this shift: No intake/output data recorded.  Physical Exam:  General: Alert and oriented. CV: RRR Lungs: Clear bilaterally. GI: Soft, Nondistended. Incisions: Clean, dry, and intact Urine: Clear Extremities: Nontender, no erythema, no edema.  Lab Results: Recent Labs    09/02/19 1615 09/03/19 0524  HGB 15.3 13.4  HCT 46.3 39.3      Assessment/Plan: POD# 1 s/p robotic prostatectomy.  1) SL IVF 2) Ambulate, Incentive spirometry 3) Transition to oral pain medication 4) Dulcolax suppository 5) D/C pelvic drain 6) Plan for likely discharge later today   [name] [name]. MD   LOS: 0 days   [name] [name] 09/03/2019, 7:52 AM

## 2019-09-03 NOTE — Discharge Summary (Signed)
  Date of admission: 09/02/2019  Date of discharge: 09/03/2019  Admission diagnosis: Prostate Cancer  Discharge diagnosis: Prostate Cancer  History and Physical: For full details, please see admission history and physical. Briefly, [name] [name] is a 59 y.o. gentleman with localized prostate cancer.  After discussing management/treatment options, he elected to proceed with surgical treatment.  Hospital Course: [facility] was taken to the operating room on 09/02/2019 and underwent a robotic assisted laparoscopic radical prostatectomy. He tolerated this procedure well and without complications. Postoperatively, he was able to be transferred to a regular hospital room following recovery from anesthesia.  He was able to begin ambulating the night of surgery. He remained hemodynamically stable overnight.  He had excellent urine output with appropriately minimal output from his pelvic drain and his pelvic drain was removed on POD #1.  He was transitioned to oral pain medication, tolerated a clear liquid diet, and had met all discharge criteria and was able to be discharged home later on POD#1.  Laboratory values:  Recent Labs    09/02/19 1615 09/03/19 0524 09/03/19 1025  HGB 15.3 13.4 14.1  HCT 46.3 39.3 40.8    Disposition: Home  Discharge instruction: He was instructed to be ambulatory but to refrain from heavy lifting, strenuous activity, or driving. He was instructed on urethral catheter care.  Discharge medications:   Allergies as of 09/03/2019   No Known Allergies     Medication List    STOP taking these medications   aspirin 81 MG chewable tablet   meloxicam 15 MG tablet Commonly known as: MOBIC     TAKE these medications   allopurinol 100 MG tablet Commonly known as: ZYLOPRIM Take 100 mg by mouth daily.   hydrochlorothiazide 25 MG tablet Commonly known as: HYDRODIURIL TAKE 1 TABLET (25 MG TOTAL) BY MOUTH DAILY. What changed: See the new instructions.    lisinopril 20 MG tablet Commonly known as: ZESTRIL TAKE 1 TABLET BY MOUTH EVERY DAY   metoprolol tartrate 25 MG tablet Commonly known as: LOPRESSOR Take 1 tablet (25 mg total) by mouth 2 (two) times daily. What changed: when to take this   pravastatin 40 MG tablet Commonly known as: PRAVACHOL Take 40 mg by mouth daily.   sulfamethoxazole-trimethoprim 800-160 MG tablet Commonly known as: BACTRIM DS Take 1 tablet by mouth 2 (two) times daily. Start the day prior to foley removal appointment   traMADol 50 MG tablet Commonly known as: Ultram Take 1-2 tablets (50-100 mg total) by mouth every 6 (six) hours as needed for moderate pain or severe pain.       Followup: He will followup in 1 week for catheter removal and to discuss his surgical pathology results.

## 2019-09-03 NOTE — Plan of Care (Signed)
  Problem: Education: Goal: Knowledge of General Education information will improve Description: Including pain rating scale, medication(s)/side effects and non-pharmacologic comfort measures Outcome: Adequate for Discharge   Problem: Health Behavior/Discharge Planning: Goal: Ability to manage health-related needs will improve Outcome: Adequate for Discharge   Problem: Clinical Measurements: Goal: Ability to maintain clinical measurements within normal limits will improve Outcome: Adequate for Discharge Goal: Will remain free from infection Outcome: Adequate for Discharge Goal: Diagnostic test results will improve Outcome: Adequate for Discharge Goal: Respiratory complications will improve Outcome: Adequate for Discharge Goal: Cardiovascular complication will be avoided Outcome: Adequate for Discharge   Problem: Activity: Goal: Risk for activity intolerance will decrease Outcome: Adequate for Discharge   Problem: Nutrition: Goal: Adequate nutrition will be maintained Outcome: Adequate for Discharge   Problem: Coping: Goal: Level of anxiety will decrease Outcome: Adequate for Discharge   Problem: Elimination: Goal: Will not experience complications related to bowel motility Outcome: Adequate for Discharge Goal: Will not experience complications related to urinary retention Outcome: Adequate for Discharge   Problem: Pain Managment: Goal: General experience of comfort will improve Outcome: Adequate for Discharge   Problem: Safety: Goal: Ability to remain free from injury will improve Outcome: Adequate for Discharge   Problem: Skin Integrity: Goal: Risk for impaired skin integrity will decrease Outcome: Adequate for Discharge   Problem: Education: Goal: Knowledge of the procedure and recovery process will improve Outcome: Adequate for Discharge   Problem: Bowel/Gastric: Goal: Gastrointestinal status for postoperative course will improve Outcome: Adequate for  Discharge   Problem: Pain Management: Goal: General experience of comfort will improve Outcome: Adequate for Discharge   Problem: Skin Integrity: Goal: Demonstration of wound healing without infection will improve Outcome: Adequate for Discharge   Problem: Urinary Elimination: Goal: Ability to avoid or minimize complications of infection will improve Outcome: Adequate for Discharge Goal: Ability to achieve and maintain urine output will improve Outcome: Adequate for Discharge Goal: Home care management will improve Outcome: Adequate for Discharge

## 2019-09-07 LAB — SURGICAL PATHOLOGY

## 2019-10-30 ENCOUNTER — Emergency Department (HOSPITAL_COMMUNITY)
Admission: EM | Admit: 2019-10-30 | Discharge: 2019-10-31 | Disposition: A | Discharge status: Home / Self Care | Payer: 59 | Attending: Emergency Medicine | Admitting: Emergency Medicine

## 2019-10-30 ENCOUNTER — Other Ambulatory Visit: Payer: Self-pay

## 2019-10-30 ENCOUNTER — Encounter (HOSPITAL_COMMUNITY): Payer: Self-pay | Admitting: *Deleted

## 2019-10-30 DIAGNOSIS — M549 Dorsalgia, unspecified: Secondary | ICD-10-CM | POA: Diagnosis not present

## 2019-10-30 DIAGNOSIS — Z5321 Procedure and treatment not carried out due to patient leaving prior to being seen by health care provider: Secondary | ICD-10-CM | POA: Insufficient documentation

## 2019-10-30 DIAGNOSIS — R55 Syncope and collapse: Secondary | ICD-10-CM | POA: Diagnosis present

## 2019-10-30 LAB — CBC
HCT: 48.2 % (ref 39.0–52.0)
Hemoglobin: 16 g/dL (ref 13.0–17.0)
MCH: 30.1 pg (ref 26.0–34.0)
MCHC: 33.2 g/dL (ref 30.0–36.0)
MCV: 90.6 fL (ref 80.0–100.0)
Platelets: 219 10*3/uL (ref 150–400)
RBC: 5.32 MIL/uL (ref 4.22–5.81)
RDW: 13.3 % (ref 11.5–15.5)
WBC: 10.2 10*3/uL (ref 4.0–10.5)
nRBC: 0 % (ref 0.0–0.2)

## 2019-10-30 LAB — URINALYSIS, ROUTINE W REFLEX MICROSCOPIC
Bilirubin Urine: NEGATIVE
Glucose, UA: NEGATIVE mg/dL
Hgb urine dipstick: NEGATIVE
Ketones, ur: NEGATIVE mg/dL
Nitrite: NEGATIVE
Protein, ur: NEGATIVE mg/dL
Specific Gravity, Urine: >1.03 — ABNORMAL HIGH (ref 1.005–1.030)
pH: 5.5 (ref 5.0–8.0)

## 2019-10-30 LAB — BASIC METABOLIC PANEL
Anion gap: 12 (ref 5–15)
BUN: 23 mg/dL — ABNORMAL HIGH (ref 6–20)
CO2: 25 mmol/L (ref 22–32)
Calcium: 9.8 mg/dL (ref 8.9–10.3)
Chloride: 100 mmol/L (ref 98–111)
Creatinine, Ser: 1.56 mg/dL — ABNORMAL HIGH (ref 0.61–1.24)
GFR calc Af Amer: 55 mL/min — ABNORMAL LOW (ref >60)
GFR calc non Af Amer: 48 mL/min — ABNORMAL LOW (ref >60)
Glucose, Bld: 162 mg/dL — ABNORMAL HIGH (ref 70–99)
Potassium: 5.5 mmol/L — ABNORMAL HIGH (ref 3.5–5.1)
Sodium: 137 mmol/L (ref 135–145)

## 2019-10-30 LAB — URINALYSIS, MICROSCOPIC (REFLEX): RBC / HPF: NONE SEEN RBC/hpf (ref 0–5)

## 2019-10-30 NOTE — ED Notes (Signed)
Pt did not want to wait and left. 

## 2019-10-30 NOTE — ED Triage Notes (Signed)
[place]  He had awakened this am with back pain hx of sciatica   He had 2 tramadol at 0530 a and he had 2 beers downtown and his back started to hurt again and he sat down at a table and fainted  His bp was low innitially  But came up as ems transported the pt

## 2021-08-27 ENCOUNTER — Encounter (HOSPITAL_COMMUNITY): Payer: Self-pay

## 2021-08-27 ENCOUNTER — Other Ambulatory Visit: Payer: Self-pay

## 2021-08-27 ENCOUNTER — Inpatient Hospital Stay (HOSPITAL_COMMUNITY)
Admission: EM | Admit: 2021-08-27 | Discharge: 2021-09-06 | DRG: 391 | Disposition: A | Discharge status: Home / Self Care | Payer: BC Managed Care – PPO | Attending: Family Medicine | Admitting: Family Medicine

## 2021-08-27 ENCOUNTER — Emergency Department (HOSPITAL_COMMUNITY): Payer: BC Managed Care – PPO

## 2021-08-27 DIAGNOSIS — Z8249 Family history of ischemic heart disease and other diseases of the circulatory system: Secondary | ICD-10-CM | POA: Diagnosis not present

## 2021-08-27 DIAGNOSIS — I252 Old myocardial infarction: Secondary | ICD-10-CM | POA: Diagnosis not present

## 2021-08-27 DIAGNOSIS — K651 Peritoneal abscess: Secondary | ICD-10-CM | POA: Diagnosis present

## 2021-08-27 DIAGNOSIS — C61 Malignant neoplasm of prostate: Secondary | ICD-10-CM | POA: Diagnosis present

## 2021-08-27 DIAGNOSIS — Z955 Presence of coronary angioplasty implant and graft: Secondary | ICD-10-CM

## 2021-08-27 DIAGNOSIS — N390 Urinary tract infection, site not specified: Secondary | ICD-10-CM | POA: Diagnosis present

## 2021-08-27 DIAGNOSIS — Z6837 Body mass index (BMI) 37.0-37.9, adult: Secondary | ICD-10-CM

## 2021-08-27 DIAGNOSIS — K750 Abscess of liver: Secondary | ICD-10-CM | POA: Diagnosis present

## 2021-08-27 DIAGNOSIS — K5780 Diverticulitis of intestine, part unspecified, with perforation and abscess without bleeding: Principal | ICD-10-CM | POA: Diagnosis present

## 2021-08-27 DIAGNOSIS — I7121 Aneurysm of the ascending aorta, without rupture: Secondary | ICD-10-CM | POA: Diagnosis present

## 2021-08-27 DIAGNOSIS — G4733 Obstructive sleep apnea (adult) (pediatric): Secondary | ICD-10-CM | POA: Diagnosis present

## 2021-08-27 DIAGNOSIS — Z8739 Personal history of other diseases of the musculoskeletal system and connective tissue: Secondary | ICD-10-CM

## 2021-08-27 DIAGNOSIS — I2510 Atherosclerotic heart disease of native coronary artery without angina pectoris: Secondary | ICD-10-CM | POA: Diagnosis present

## 2021-08-27 DIAGNOSIS — B9620 Unspecified Escherichia coli [E. coli] as the cause of diseases classified elsewhere: Secondary | ICD-10-CM | POA: Diagnosis present

## 2021-08-27 DIAGNOSIS — E871 Hypo-osmolality and hyponatremia: Secondary | ICD-10-CM | POA: Diagnosis present

## 2021-08-27 DIAGNOSIS — T501X5A Adverse effect of loop [high-ceiling] diuretics, initial encounter: Secondary | ICD-10-CM | POA: Diagnosis present

## 2021-08-27 DIAGNOSIS — B952 Enterococcus as the cause of diseases classified elsewhere: Secondary | ICD-10-CM | POA: Diagnosis present

## 2021-08-27 DIAGNOSIS — D649 Anemia, unspecified: Secondary | ICD-10-CM | POA: Diagnosis present

## 2021-08-27 DIAGNOSIS — E785 Hyperlipidemia, unspecified: Secondary | ICD-10-CM | POA: Diagnosis present

## 2021-08-27 DIAGNOSIS — R609 Edema, unspecified: Secondary | ICD-10-CM | POA: Diagnosis not present

## 2021-08-27 DIAGNOSIS — M109 Gout, unspecified: Secondary | ICD-10-CM | POA: Diagnosis present

## 2021-08-27 DIAGNOSIS — R7989 Other specified abnormal findings of blood chemistry: Secondary | ICD-10-CM | POA: Diagnosis present

## 2021-08-27 DIAGNOSIS — G43909 Migraine, unspecified, not intractable, without status migrainosus: Secondary | ICD-10-CM | POA: Diagnosis present

## 2021-08-27 DIAGNOSIS — I10 Essential (primary) hypertension: Secondary | ICD-10-CM | POA: Diagnosis present

## 2021-08-27 DIAGNOSIS — Z79899 Other long term (current) drug therapy: Secondary | ICD-10-CM | POA: Diagnosis not present

## 2021-08-27 DIAGNOSIS — K5720 Diverticulitis of large intestine with perforation and abscess without bleeding: Secondary | ICD-10-CM | POA: Diagnosis present

## 2021-08-27 DIAGNOSIS — Z8546 Personal history of malignant neoplasm of prostate: Secondary | ICD-10-CM

## 2021-08-27 DIAGNOSIS — Z96652 Presence of left artificial knee joint: Secondary | ICD-10-CM | POA: Diagnosis present

## 2021-08-27 DIAGNOSIS — E8770 Fluid overload, unspecified: Secondary | ICD-10-CM | POA: Diagnosis present

## 2021-08-27 LAB — COMPREHENSIVE METABOLIC PANEL
ALT: 104 U/L — ABNORMAL HIGH (ref 0–44)
AST: 82 U/L — ABNORMAL HIGH (ref 15–41)
Albumin: 2.6 g/dL — ABNORMAL LOW (ref 3.5–5.0)
Alkaline Phosphatase: 213 U/L — ABNORMAL HIGH (ref 38–126)
Anion gap: 14 (ref 5–15)
BUN: 27 mg/dL — ABNORMAL HIGH (ref 8–23)
CO2: 23 mmol/L (ref 22–32)
Calcium: 9 mg/dL (ref 8.9–10.3)
Chloride: 98 mmol/L (ref 98–111)
Creatinine, Ser: 1.45 mg/dL — ABNORMAL HIGH (ref 0.61–1.24)
GFR, Estimated: 54 mL/min — ABNORMAL LOW (ref >60)
Glucose, Bld: 169 mg/dL — ABNORMAL HIGH (ref 70–99)
Potassium: 3.6 mmol/L (ref 3.5–5.1)
Sodium: 135 mmol/L (ref 135–145)
Total Bilirubin: 1.6 mg/dL — ABNORMAL HIGH (ref 0.3–1.2)
Total Protein: 7.9 g/dL (ref 6.5–8.1)

## 2021-08-27 LAB — URINALYSIS, ROUTINE W REFLEX MICROSCOPIC
Bilirubin Urine: NEGATIVE
Glucose, UA: NEGATIVE mg/dL
Hgb urine dipstick: NEGATIVE
Ketones, ur: NEGATIVE mg/dL
Leukocytes,Ua: NEGATIVE
Nitrite: NEGATIVE
Protein, ur: NEGATIVE mg/dL
Specific Gravity, Urine: >1.046 — ABNORMAL HIGH (ref 1.005–1.030)
pH: 5 (ref 5.0–8.0)

## 2021-08-27 LAB — CBC
HCT: 34.9 % — ABNORMAL LOW (ref 39.0–52.0)
Hemoglobin: 11.5 g/dL — ABNORMAL LOW (ref 13.0–17.0)
MCH: 31 pg (ref 26.0–34.0)
MCHC: 33 g/dL (ref 30.0–36.0)
MCV: 94.1 fL (ref 80.0–100.0)
Platelets: 312 10*3/uL (ref 150–400)
RBC: 3.71 MIL/uL — ABNORMAL LOW (ref 4.22–5.81)
RDW: 13.9 % (ref 11.5–15.5)
WBC: 14.8 10*3/uL — ABNORMAL HIGH (ref 4.0–10.5)
nRBC: 0 % (ref 0.0–0.2)

## 2021-08-27 LAB — CBC WITH DIFFERENTIAL/PLATELET
Abs Immature Granulocytes: 0.21 10*3/uL — ABNORMAL HIGH (ref 0.00–0.07)
Basophils Absolute: 0 10*3/uL (ref 0.0–0.1)
Basophils Relative: 0 %
Eosinophils Absolute: 0.1 10*3/uL (ref 0.0–0.5)
Eosinophils Relative: 0 %
HCT: 38.8 % — ABNORMAL LOW (ref 39.0–52.0)
Hemoglobin: 12.9 g/dL — ABNORMAL LOW (ref 13.0–17.0)
Immature Granulocytes: 1 %
Lymphocytes Relative: 8 %
Lymphs Abs: 1.3 10*3/uL (ref 0.7–4.0)
MCH: 30.8 pg (ref 26.0–34.0)
MCHC: 33.2 g/dL (ref 30.0–36.0)
MCV: 92.6 fL (ref 80.0–100.0)
Monocytes Absolute: 1.3 10*3/uL — ABNORMAL HIGH (ref 0.1–1.0)
Monocytes Relative: 8 %
Neutro Abs: 13 10*3/uL — ABNORMAL HIGH (ref 1.7–7.7)
Neutrophils Relative %: 83 %
Platelets: 336 10*3/uL (ref 150–400)
RBC: 4.19 MIL/uL — ABNORMAL LOW (ref 4.22–5.81)
RDW: 13.6 % (ref 11.5–15.5)
WBC: 15.9 10*3/uL — ABNORMAL HIGH (ref 4.0–10.5)
nRBC: 0 % (ref 0.0–0.2)

## 2021-08-27 LAB — HIV ANTIBODY (ROUTINE TESTING W REFLEX): HIV Screen 4th Generation wRfx: NONREACTIVE

## 2021-08-27 LAB — PROTIME-INR
INR: 1.2 (ref 0.8–1.2)
Prothrombin Time: 14.7 seconds (ref 11.4–15.2)

## 2021-08-27 LAB — APTT: aPTT: 28 seconds (ref 24–36)

## 2021-08-27 LAB — CREATININE, SERUM
Creatinine, Ser: 1.36 mg/dL — ABNORMAL HIGH (ref 0.61–1.24)
GFR, Estimated: 59 mL/min — ABNORMAL LOW (ref >60)

## 2021-08-27 LAB — LACTIC ACID, PLASMA
Lactic Acid, Venous: 1.8 mmol/L (ref 0.5–1.9)
Lactic Acid, Venous: 1.7 mmol/L (ref 0.5–1.9)

## 2021-08-27 MED ORDER — LACTATED RINGERS IV SOLN: INTRAVENOUS | Status: AC

## 2021-08-27 MED ORDER — ALBUTEROL SULFATE (2.5 MG/3ML) 0.083% IN NEBU: 2.5000 mg | INHALATION_SOLUTION | RESPIRATORY_TRACT | Status: DC | PRN

## 2021-08-27 MED ORDER — SODIUM CHLORIDE 0.9 % IV SOLN
2.0000 g | Freq: Three times a day (TID) | INTRAVENOUS | Status: DC
  Administered 2021-08-28 – 2021-08-29 (×4): 2 g via INTRAVENOUS
  Filled 2021-08-27 (×4): qty 12.5

## 2021-08-27 MED ORDER — METRONIDAZOLE 500 MG/100ML IV SOLN
500.0000 mg | Freq: Once | INTRAVENOUS | Status: AC
Start: 2021-08-27 — End: 2021-08-27
  Administered 2021-08-27: 500 mg via INTRAVENOUS
  Filled 2021-08-27: qty 100

## 2021-08-27 MED ORDER — SODIUM CHLORIDE 0.9 % IV BOLUS (SEPSIS)
2000.0000 mL | Freq: Once | INTRAVENOUS | Status: AC
  Administered 2021-08-27: 2000 mL via INTRAVENOUS

## 2021-08-27 MED ORDER — HEPARIN SODIUM (PORCINE) 5000 UNIT/ML IJ SOLN
5000.0000 [IU] | Freq: Three times a day (TID) | INTRAMUSCULAR | Status: DC
  Administered 2021-08-27 – 2021-08-28 (×2): 5000 [IU] via SUBCUTANEOUS
  Filled 2021-08-27 (×2): qty 1

## 2021-08-27 MED ORDER — FENTANYL CITRATE PF 50 MCG/ML IJ SOSY
12.5000 ug | PREFILLED_SYRINGE | INTRAMUSCULAR | Status: DC | PRN
  Administered 2021-08-28 – 2021-08-30 (×4): 50 ug via INTRAVENOUS
  Filled 2021-08-27 (×5): qty 1

## 2021-08-27 MED ORDER — LISINOPRIL 20 MG PO TABS
20.0000 mg | ORAL_TABLET | Freq: Every day | ORAL | Status: DC
  Administered 2021-08-28 – 2021-08-30 (×3): 20 mg via ORAL
  Filled 2021-08-27 (×3): qty 1

## 2021-08-27 MED ORDER — SODIUM CHLORIDE 0.9 % IV SOLN
2.0000 g | Freq: Once | INTRAVENOUS | Status: AC
  Administered 2021-08-27: 2 g via INTRAVENOUS
  Filled 2021-08-27: qty 12.5

## 2021-08-27 MED ORDER — PANTOPRAZOLE SODIUM 40 MG IV SOLR
40.0000 mg | INTRAVENOUS | Status: DC
  Administered 2021-08-27 – 2021-09-03 (×8): 40 mg via INTRAVENOUS
  Filled 2021-08-27 (×8): qty 10

## 2021-08-27 MED ORDER — ONDANSETRON HCL 4 MG/2ML IJ SOLN: 4.0000 mg | Freq: Four times a day (QID) | INTRAMUSCULAR | Status: DC | PRN

## 2021-08-27 MED ORDER — ONDANSETRON HCL 4 MG PO TABS: 4.0000 mg | ORAL_TABLET | Freq: Four times a day (QID) | ORAL | Status: DC | PRN

## 2021-08-27 MED ORDER — METRONIDAZOLE 500 MG/100ML IV SOLN
500.0000 mg | Freq: Two times a day (BID) | INTRAVENOUS | Status: DC
  Administered 2021-08-27 – 2021-08-28 (×3): 500 mg via INTRAVENOUS
  Filled 2021-08-27 (×3): qty 100

## 2021-08-27 MED ORDER — METOPROLOL SUCCINATE ER 25 MG PO TB24
25.0000 mg | ORAL_TABLET | Freq: Every day | ORAL | Status: DC
  Administered 2021-08-28 – 2021-09-05 (×9): 25 mg via ORAL
  Filled 2021-08-27 (×9): qty 1

## 2021-08-27 NOTE — ED Notes (Signed)
First blood culture collected, pt has already received Doxycycline.

## 2021-08-27 NOTE — ED Notes (Signed)
IV attempted but unsuccessful

## 2021-08-27 NOTE — ED Provider Notes (Signed)
[facility] EMERGENCY DEPARTMENT Provider Note   CSN: [id number] Arrival date & time: 08/27/21  1548     History {Add pertinent medical, surgical, social history, OB history to HPI:1} Chief Complaint  Patient presents with   Abnormal CT    [name] [name] is a 61 y.o. male.  HPI    Per outside records patient was evaluated at [facility] in family medicine in the setting of elevated LFTs and received a CT abdomen pelvis with and without IV contrast.  This showed evidence of a perforated diverticulitis with a fluid-filled track extending from the inferior mid sigmoid colon towards the superior margin of the bladder where there is a small fluid collection concerning for an abscess.     Home Medications Prior to Admission medications   Medication Sig Start Date End Date Taking? Authorizing Provider  allopurinol (ZYLOPRIM) 100 MG tablet Take 100 mg by mouth daily.  05/05/19   [provider]  hydrochlorothiazide (HYDRODIURIL) 25 MG tablet TAKE 1 TABLET (25 MG TOTAL) BY MOUTH DAILY. Patient taking differently: Take 25 mg by mouth daily.  01/09/15   Burtis Junes, NP  lisinopril (PRINIVIL,ZESTRIL) 20 MG tablet TAKE 1 TABLET BY MOUTH EVERY DAY Patient taking differently: Take 20 mg by mouth daily.  07/04/14   Larey Dresser, MD  metoprolol tartrate (LOPRESSOR) 25 MG tablet Take 1 tablet (25 mg total) by mouth 2 (two) times daily. Patient taking differently: Take 25 mg by mouth daily.  12/01/13   Larey Dresser, MD  pravastatin (PRAVACHOL) 40 MG tablet Take 40 mg by mouth daily. 08/20/17   [provider]  sulfamethoxazole-trimethoprim (BACTRIM DS) 800-160 MG tablet Take 1 tablet by mouth 2 (two) times daily. Start the day prior to foley removal appointment 09/02/19   Debbrah Alar, PA-C  traMADol (ULTRAM) 50 MG tablet Take 1-2 tablets (50-100 mg total) by mouth every 6 (six) hours as needed for moderate pain or severe pain. 09/02/19    Debbrah Alar, PA-C      Allergies    Patient has no known allergies.    Review of Systems   Review of Systems  Physical Exam Updated Vital Signs BP 134/77 (BP Location: Right Arm)   Pulse 92   Temp 98.2 F (36.8 C) (Oral)   Resp 14   Ht 5' 10.5" (1.791 m)   Wt 113.9 kg   SpO2 94%   BMI 35.51 kg/m  Physical Exam  ED Results / Procedures / Treatments   Labs (all labs ordered are listed, but only abnormal results are displayed) Labs Reviewed  CULTURE, BLOOD (ROUTINE X 2)  CULTURE, BLOOD (ROUTINE X 2)  URINE CULTURE  LACTIC ACID, PLASMA  LACTIC ACID, PLASMA  COMPREHENSIVE METABOLIC PANEL  CBC WITH DIFFERENTIAL/PLATELET  PROTIME-INR  APTT  URINALYSIS, ROUTINE W REFLEX MICROSCOPIC    EKG None  Radiology No results found.  Procedures Procedures  {Document cardiac monitor, telemetry assessment procedure when appropriate:1}  Medications Ordered in ED Medications - No data to display  ED Course/ Medical Decision Making/ A&P                           Medical Decision Making  ***  {Document critical care time when appropriate:1} {Document review of labs and clinical decision tools ie heart score, Chads2Vasc2 etc:1}  {Document your independent review of radiology images, and any outside records:1} {Document your discussion with family members, caretakers, and with consultants:1} {  Document social determinants of health affecting pt's care:1} {Document your decision making why or why not admission, treatments were needed:1} Final Clinical Impression(s) / ED Diagnoses Final diagnoses:  None    Rx / DC Orders ED Discharge Orders     None

## 2021-08-27 NOTE — Consult Note (Signed)
Reason for Consult:diverticulitis Referring Provider: Varney Biles  [name] [name] is an 61 y.o. male.  HPI: 61 yo male with 12 days of fevers and malaise. He initially thought it was a UTI and had started an antibiotic and then there was concern for lyme disease and was changed to doxycycline. His fevers were as high as 103 and averaged 100.5 This morning his fevers were improved but he had severe fatigue and came to his doctor's office and then got tests and a CT scan done which showed diverticulitis with abscess.  He denies abdominal pain. He never had nausea or diarrhea. He had 4 bowel movements in the last 6 days that were smaller than usual and also darker.  He had small amount of blood in his urine once but that has resolved.  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 Has left to right collaterals. Last cath in 2009 with stent to to LAD. 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   CORONARY STENT PLACEMENT     LAD WITH A 3.0X15MM PROMUS DRUG-ELUTING STENT PLACED.   INGUINAL HERNIA REPAIR N/A 09/02/2019   Procedure: HERNIA REPAIR UMBILICAL ,ADULT;  Surgeon: Raynelle Bring, MD;  Location: [facility];  Service: Urology;  Laterality: N/A;   KNEE ARTHROPLASTY Left 10/23/2017   Procedure: LEFT TOTAL KNEE ARTHROPLASTY WITH COMPUTER NAVIGATION;   Surgeon: Rod Can, MD;  Location: [facility];  Service: Orthopedics;  Laterality: Left;  Adductor Block   KNEE ARTHROSCOPY Left 1982   LYMPHADENECTOMY Bilateral 09/02/2019   Procedure: LYMPHADENECTOMY, PELVIC;  Surgeon: Raynelle Bring, MD;  Location: [facility];  Service: Urology;  Laterality: Bilateral;   ROBOT ASSISTED LAPAROSCOPIC RADICAL PROSTATECTOMY N/A 09/02/2019   Procedure: XI ROBOTIC ASSISTED LAPAROSCOPIC RADICAL PROSTATECTOMY LEVEL 2;  Surgeon: Raynelle Bring, MD;  Location: [facility];  Service: Urology;  Laterality: N/A;   TONSILLECTOMY     US ECHOCARDIOGRAPHY  03/14/2003   EF 55-60%    Family History  Problem Relation Age of Onset   Hypertension Mother    Lung cancer Father        smoked three packs per day   Prostate cancer Neg Hx    Colon cancer Neg Hx    Pancreatic cancer Neg Hx    Breast cancer Neg Hx     Social History:  reports that he has never smoked. He has never used smokeless tobacco. He reports current alcohol use. He reports that he does not currently use drugs after having used the following drugs: Marijuana.  Allergies: No Known Allergies  Medications: I have reviewed the patient's current medications.  Results for orders placed or performed during the hospital encounter of 08/27/21 (from  the past 48 hour(s))  Urinalysis, Routine w reflex microscopic     Status: Abnormal   Collection Time: 08/27/21  3:48 PM  Result Value Ref Range   Color, Urine AMBER (A) YELLOW    Comment: BIOCHEMICALS MAY BE AFFECTED BY COLOR   APPearance CLEAR CLEAR   Specific Gravity, Urine >1.046 (H) 1.005 - 1.030   pH 5.0 5.0 - 8.0   Glucose, UA NEGATIVE NEGATIVE mg/dL   Hgb urine dipstick NEGATIVE NEGATIVE   Bilirubin Urine NEGATIVE NEGATIVE   Ketones, ur NEGATIVE NEGATIVE mg/dL   Protein, ur NEGATIVE NEGATIVE mg/dL   Nitrite NEGATIVE NEGATIVE   Leukocytes,Ua NEGATIVE NEGATIVE    Comment: Performed at [facility] [address], [place], [place] [address]  Lactic acid,  plasma     Status: None   Collection Time: 08/27/21  6:24 PM  Result Value Ref Range   Lactic Acid, Venous 1.8 0.5 - 1.9 mmol/L    Comment: Performed at [facility] [address], [place], [place] [address]  Comprehensive metabolic panel     Status: Abnormal   Collection Time: 08/27/21  6:24 PM  Result Value Ref Range   Sodium 135 135 - 145 mmol/L   Potassium 3.6 3.5 - 5.1 mmol/L   Chloride 98 98 - 111 mmol/L   CO2 23 22 - 32 mmol/L   Glucose, Bld 169 (H) 70 - 99 mg/dL    Comment: Glucose reference range applies only to samples taken after fasting for at least 8 hours.   BUN 27 (H) 8 - 23 mg/dL   Creatinine, Ser 1.45 (H) 0.61 - 1.24 mg/dL   Calcium 9.0 8.9 - 10.3 mg/dL   Total Protein 7.9 6.5 - 8.1 g/dL   Albumin 2.6 (L) 3.5 - 5.0 g/dL   AST 82 (H) 15 - 41 U/L   ALT 104 (H) 0 - 44 U/L   Alkaline Phosphatase 213 (H) 38 - 126 U/L   Total Bilirubin 1.6 (H) 0.3 - 1.2 mg/dL   GFR, Estimated 54 (L) >60 mL/min    Comment: (NOTE) Calculated using the CKD-EPI Creatinine Equation (2021)    Anion gap 14 5 - 15    Comment: Performed at [facility], [place] [address] [place],  [address]  CBC with Differential     Status: Abnormal   Collection Time: 08/27/21  6:24 PM  Result Value Ref Range   WBC 15.9 (H) 4.0 - 10.5 K/uL   RBC 4.19 (L) 4.22 - 5.81 MIL/uL   Hemoglobin 12.9 (L) 13.0 - 17.0 g/dL   HCT 38.8 (L) 39.0 - 52.0 %   MCV 92.6 80.0 - 100.0 fL   MCH 30.8 26.0 - 34.0 pg   MCHC 33.2 30.0 - 36.0 g/dL   RDW 13.6 11.5 - 15.5 %   Platelets 336 150 - 400 K/uL   nRBC 0.0 0.0 - 0.2 %   Neutrophils Relative % 83 %   Neutro Abs 13.0 (H) 1.7 - 7.7 K/uL   Lymphocytes Relative 8 %   Lymphs Abs 1.3 0.7 - 4.0 K/uL   Monocytes Relative 8 %   Monocytes Absolute 1.3 (H) 0.1 - 1.0 K/uL   Eosinophils Relative 0 %   Eosinophils Absolute 0.1 0.0 - 0.5 K/uL   Basophils Relative 0 %   Basophils Absolute 0.0 0.0 - 0.1 K/uL   Immature Granulocytes 1 %   Abs Immature Granulocytes  0.21 (H) 0.00 - 0.07 K/uL    Comment:  Performed at [facility], [place] [address], [place], [place] [address]  Protime-INR     Status: None   Collection Time: 08/27/21  6:24 PM  Result Value Ref Range   Prothrombin Time 14.7 11.4 - 15.2 seconds   INR 1.2 0.8 - 1.2    Comment: (NOTE) INR goal varies based on device and disease states. Performed at [facility], [place] [address] [place], [place] [address]   APTT     Status: None   Collection Time: 08/27/21  6:24 PM  Result Value Ref Range   aPTT 28 24 - 36 seconds    Comment: Performed at [facility] [address], [place], [place] [address]  Lactic acid, plasma     Status: None   Collection Time: 08/27/21  7:45 PM  Result Value Ref Range   Lactic Acid, Venous 1.7 0.5 - 1.9 mmol/L    Comment: Performed at [facility] [address], [place], [place] [address]    DG Chest 2 View  Result Date: 08/27/2021 CLINICAL DATA:  Provided history: Possible sepsis. EXAM: CHEST - 2 VIEW COMPARISON:  Prior chest radiographs 04/09/2004 and earlier. FINDINGS: Shallow inspiration radiograph. Heart size within normal limits. Mild ill-defined opacity at both lung bases with an appearance most suggestive of atelectasis. No evidence of pleural effusion or pneumothorax. No acute bony abnormality identified. IMPRESSION: Shallow inspiration radiograph. Mild ill-defined opacity within both lung bases, with an appearance favoring atelectasis. Electronically Signed   By: Kellie Simmering D.O.   On: 08/27/2021 16:59    Review of Systems  Constitutional:  Positive for chills, diaphoresis, fever and malaise/fatigue.  HENT: Negative.    Eyes: Negative.   Respiratory:  Positive for cough.   Cardiovascular: Negative.   Gastrointestinal: Negative.   Genitourinary:  Positive for hematuria.  Musculoskeletal: Negative.   Skin: Negative.   Neurological: Negative.   Endo/Heme/Allergies: Negative.   Psychiatric/Behavioral: Negative.       PE Blood pressure 134/78, pulse 81, temperature 98.2 F (36.8 C), temperature source Oral, resp. rate 10, height 5' 10.5" (1.791 m), weight 113.9 kg, SpO2 98 %. Constitutional: NAD; conversant; no deformities Eyes: Moist conjunctiva; no lid lag; anicteric; PERRL Neck: Trachea midline; no thyromegaly Lungs: Normal respiratory effort; no tactile fremitus CV: RRR; no palpable thrills; no pitting edema GI: Abd nontender; no palpable hepatosplenomegaly MSK: Normal gait; no clubbing/cyanosis Psychiatric: Appropriate affect; alert and oriented x3 Lymphatic: No palpable cervical or axillary lymphadenopathy Skin: No major subcutaneous nodules. Warm and dry   Assessment/Plan: 61 yo male with fevers, WBC and evidence on CT scan of diverticulitis with 3 x 2.3 cm abscess. Strangely he has no tenderness in the suprapubic area. -agree with antibiotics -clear liquids now until 5am -abscess is borderline size for drainage, will discuss with radiology tomorrow  I reviewed last 24 h vitals and pain scores, last 48 h intake and output, last 24 h labs and trends, and last 24 h imaging results.  This care required high  level of medical decision making.   Arta Bruce Sidonia Nutter 08/27/2021, 9:30 PM

## 2021-08-27 NOTE — ED Provider Triage Note (Signed)
Emergency Medicine Provider Triage Evaluation Note  [name] [name] , a 61 y.o. male  was evaluated in triage.  Pt complains of sickness for about 10 days.  He has been treated with fosfomycin for possible UTI, doxycycline for possible Lyme disease, and today was concerned about being possibly jaundiced.  He has been seen by his PCP multiple times with [facility]. Today they obtained a CT scan, results/read from radiologist is visible in [place].  Patient's last fever was this morning.  He was sent for concern of sepsis and intra-abdominal abscess.   Physical Exam  BP 134/77 (BP Location: Right Arm)   Pulse 92   Temp 98.2 F (36.8 C) (Oral)   Resp 14   Ht 5' 10.5" (1.791 m)   Wt 113.9 kg   SpO2 94%   BMI 35.51 kg/m  Gen:   Awake, appears ill but is not in distress or extremis Resp:  Normal effort  MSK:   Moves extremities without difficulty  Other:  Normal speech  Medical Decision Making  Medically screening exam initiated at 4:28 PM.  Appropriate orders placed.  BRENNON OTTERNESS was informed that the remainder of the evaluation will be completed by another provider, this initial triage assessment does not replace that evaluation, and the importance of remaining in the ED until their evaluation is complete.  Note: Portions of this report may have been transcribed using voice recognition software. Every effort was made to ensure accuracy; however, inadvertent computerized transcription errors may be present  Patient taken to room in the back.;    Lorin Glass, PA-C 08/27/21 1630

## 2021-08-27 NOTE — Progress Notes (Signed)
[facility] following for sepsis protocol. 

## 2021-08-27 NOTE — Progress Notes (Signed)
Pharmacy Antibiotic Note  [name] [name] is a 61 y.o. male admitted on 08/27/2021 with  intra-abd infection - ruptured diverticulum .  Pharmacy has been consulted for Cefepime dosing. Also ordered Flagyl x 1 by ED MD.  Plan: Cefepime 2gm IV q8h Will f/u renal function, micro data, and pt's clinical condition  Height: 5' 10.5" (179.1 cm) Weight: 113.9 kg (251 lb) IBW/kg (Calculated) : 74.15  Temp (24hrs), Avg:98.2 F (36.8 C), Min:98.2 F (36.8 C), Max:98.2 F (36.8 C)  No results for input(s): "WBC", "CREATININE", "LATICACIDVEN", "VANCOTROUGH", "VANCOPEAK", "VANCORANDOM", "GENTTROUGH", "GENTPEAK", "GENTRANDOM", "TOBRATROUGH", "TOBRAPEAK", "TOBRARND", "AMIKACINPEAK", "AMIKACINTROU", "AMIKACIN" in the last 168 hours.  CrCl cannot be calculated (Patient's most recent lab result is older than the maximum 21 days allowed.).    No Known Allergies  Antimicrobials this admission: 7/10 Flagyl x 1 7/10 Cefepime >>   Microbiology results: 7/10 BCx:  7/10 UCx:    Thank you for allowing pharmacy to be a part of this patient's care.  Sherlon Handing, PharmD, BCPS Please see amion for complete clinical pharmacist phone list 08/27/2021 5:24 PM

## 2021-08-27 NOTE — Progress Notes (Signed)
Made two attempts to get order corrected for exam.  Order still not corrected.  Spoke with RN about situation.

## 2021-08-27 NOTE — H&P (Signed)
History and Physical    [name] [name] YOV:[id number] DOB: 1960/03/04 DOA: 08/27/2021  PCP: [facility]  Patient coming from: pcp office  I have personally briefly reviewed patient's old medical records in [place]  Chief Complaint: fever chills/ poor intake x 11 days found to have  ruptured diverticulum with associated abscess   HPI: [name] [name] is a 61 y.o. male with medical history significant of  CAD s/p MI s/p stent x2 last 2009, HLD, HTN ,Obesity , OSA, hx of prostate ca,  who presents to ED in referral from pcp after workup of 11 days of fever,cough , body aches, low appetite all associated generalized malaise.  Patient on evaluation  was found to have elevated lfts, CT scan was ordred which was notable for perforated diverticulitis associated with small fluid collection suggestive of abscess. In addition to new liver lesions question abscess vs metastatic disease. Patient due to these findings was referred to ED. Patient currently states he  has no pain  but still feels very fatigued. He notes he has been able to eat but only small amounts due to poor appetite but denies nausea. He also states he has had stools but stool caliber changed from his baseline as it is much smaller.  He also notes his cough is persistent but noted no sob.  He however does endorse pleuritic chest pain with cough.  ED Course:   Vitals: Afeb, bp134/77, hr 92, rr 14, sat 94% -100  Labs: XAJ:OINO ill-defined opacity within both lung bases, with an appearance favoring atelectasis.  EKG: nsr rbbb, q in inferior lead Ua neg Latic 1.8  NA : 135,K 3.6, glu 169, cr 1.45 at baseline Ast 82, alkphos 676HMC 947, t-blic1.6 CTAB IMPRESSION:  1.  Findings are compatible with perforated diverticulitis with a fluid-filled tract extending from the inferior mid sigmoid colon towards the superior margin of the bladder where there is a small fluid collection which concerning  for an abscess. There is no air within the bladder to confirm a colovesical fistula.   2.  There are numerous low-attenuation liver lesions which could be due to metastatic disease or potentially hepatic abscesses given the findings above.   3.  There is wall thickening of the proximal sigmoid colon. While this may be due to mild acute inflammation, neoplasm should also be considered.   4.  Tiny nonobstructing renal calculi.   5.  Splenomegaly.   CXRIMPRESSION: Shallow inspiration radiograph.   Mild ill-defined opacity within both lung bases, with an appearance favoring atelectasis.  Tx cefepime, metronidazole Review of Systems: As per HPI otherwise 10 point review of systems negative.   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 Has left to right collaterals. Last cath in 2009 with stent to to LAD. 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   Obesity    OSA (obstructive sleep apnea) 03/01/2014   Severe with AHI 72.5 events/hr; no CPAP in use since weight loss    Prostate cancer [facility])    Sleep apnea    Stress     Past Surgical History:  Procedure Laterality Date   ANTERIOR CRUCIATE LIGAMENT REPAIR Right 1990   CARDIAC CATHETERIZATION  10/09/2007  EF 45-50%   CARDIOVASCULAR STRESS TEST  11/23/2008   EF 55%   CORONARY STENT PLACEMENT     RIGHT CORONARY THAT SUBSEQUENTLY OCCLUDED.   CORONARY STENT PLACEMENT     LAD WITH A 3.0X15MM PROMUS DRUG-ELUTING STENT PLACED.   INGUINAL HERNIA REPAIR N/A 09/02/2019   Procedure: HERNIA REPAIR UMBILICAL ,ADULT;  Surgeon: Raynelle Bring, MD;  Location: [facility];  Service: Urology;  Laterality: N/A;   KNEE ARTHROPLASTY Left 10/23/2017   Procedure: LEFT TOTAL KNEE ARTHROPLASTY WITH COMPUTER NAVIGATION;  Surgeon: Rod Can, MD;  Location: [facility];   Service: Orthopedics;  Laterality: Left;  Adductor Block   KNEE ARTHROSCOPY Left 1982   LYMPHADENECTOMY Bilateral 09/02/2019   Procedure: LYMPHADENECTOMY, PELVIC;  Surgeon: Raynelle Bring, MD;  Location: [facility];  Service: Urology;  Laterality: Bilateral;   ROBOT ASSISTED LAPAROSCOPIC RADICAL PROSTATECTOMY N/A 09/02/2019   Procedure: XI ROBOTIC ASSISTED LAPAROSCOPIC RADICAL PROSTATECTOMY LEVEL 2;  Surgeon: Raynelle Bring, MD;  Location: [facility];  Service: Urology;  Laterality: N/A;   TONSILLECTOMY     US ECHOCARDIOGRAPHY  03/14/2003   EF 55-60%     reports that he has never smoked. He has never used smokeless tobacco. He reports current alcohol use. He reports that he does not currently use drugs after having used the following drugs: Marijuana.  No Known Allergies  Family History  Problem Relation Age of Onset   Hypertension Mother    Lung cancer Father        smoked three packs per day   Prostate cancer Neg Hx    Colon cancer Neg Hx    Pancreatic cancer Neg Hx    Breast cancer Neg Hx     Prior to Admission medications   Medication Sig Start Date End Date Taking? Authorizing Provider  allopurinol (ZYLOPRIM) 100 MG tablet Take 100 mg by mouth daily.  05/05/19  Yes [provider]  doxycycline (VIBRA-TABS) 100 MG tablet Take 100 mg by mouth 2 (two) times daily. 08/23/21  Yes [provider]  hydrochlorothiazide (HYDRODIURIL) 25 MG tablet TAKE 1 TABLET (25 MG TOTAL) BY MOUTH DAILY. Patient taking differently: Take 25 mg by mouth daily. 01/09/15  Yes Burtis Junes, NP  ibuprofen (ADVIL) 200 MG tablet Take 800 mg by mouth every 6 (six) hours as needed for mild pain.   Yes [provider]  lisinopril (PRINIVIL,ZESTRIL) 20 MG tablet TAKE 1 TABLET BY MOUTH EVERY DAY Patient taking differently: Take 20 mg by mouth daily. 07/04/14  Yes Larey Dresser, MD  meloxicam (MOBIC) 15 MG tablet Take 15 mg by mouth daily as needed for pain. 07/20/21  Yes [provider]   metoprolol succinate (TOPROL-XL) 25 MG 24 hr tablet Take 25 mg by mouth daily. 08/22/21  Yes [provider]  tadalafil (CIALIS) 5 MG tablet Take 5 mg by mouth daily. 08/20/21  Yes [provider]  metoprolol tartrate (LOPRESSOR) 25 MG tablet Take 1 tablet (25 mg total) by mouth 2 (two) times daily. Patient not taking: Reported on 08/27/2021 12/01/13   Larey Dresser, MD  pravastatin (PRAVACHOL) 40 MG tablet Take 40 mg by mouth daily. Patient not taking: Reported on 08/27/2021 08/20/17   [provider]  sulfamethoxazole-trimethoprim (BACTRIM DS) 800-160 MG tablet Take 1 tablet by mouth 2 (two) times daily. Start the day prior to foley removal appointment Patient not taking: Reported on 08/27/2021 09/02/19   Debbrah Alar, PA-C  traMADol (ULTRAM) 50 MG tablet Take 1-2 tablets (50-100 mg total) by  mouth every 6 (six) hours as needed for moderate pain or severe pain. Patient not taking: Reported on 08/27/2021 09/02/19   Debbrah Alar, PA-C    Physical Exam: Vitals:   08/27/21 1745 08/27/21 1800 08/27/21 1900 08/27/21 2000  BP: 124/78 (!) 141/80 114/76 138/87  Pulse: 80 79 87 90  Resp:  (!) 24 20 (!) 23  Temp:      TempSrc:      SpO2: 96% 99% 94% 97%  Weight:      Height:        Vitals:   08/27/21 1745 08/27/21 1800 08/27/21 1900 08/27/21 2000  BP: 124/78 (!) 141/80 114/76 138/87  Pulse: 80 79 87 90  Resp:  (!) 24 20 (!) 23  Temp:      TempSrc:      SpO2: 96% 99% 94% 97%  Weight:      Height:       Constitutional: NAD, calm, comfortable Eyes: PERRL, lids and conjunctivae normal ENMT: Mucous membranes are moist. Posterior pharynx clear of any exudate or lesions.Normal dentition.  Neck: normal, supple, no masses, no thyromegaly Respiratory: clear to auscultation bilaterally, no wheezing, no crackles. Normal respiratory effort. No accessory muscle use.  Cardiovascular: Regular rate and rhythm, no murmurs / rubs / gallops. No extremity edema. 2+ pedal pulses. No  carotid bruits.  Abdomen: no tenderness, no masses palpated. No hepatosplenomegaly. Bowel sounds positive. distended Musculoskeletal: no clubbing / cyanosis. No joint deformity upper and lower extremities. Good ROM, no contractures. Normal muscle tone.  Skin: no rashes, lesions, ulcers. No induration Neurologic: CN 2-12 grossly intact. Sensation intact Strength 5/5 in all 4.  Psychiatric: Normal judgment and insight. Alert and oriented x 3. Normal mood.    Labs on Admission: I have personally reviewed following labs and imaging studies  CBC: Recent Labs  Lab 08/27/21 1824  WBC 15.9*  NEUTROABS 13.0*  HGB 12.9*  HCT 38.8*  MCV 92.6  PLT 026   Basic Metabolic Panel: Recent Labs  Lab 08/27/21 1824  NA 135  K 3.6  CL 98  CO2 23  GLUCOSE 169*  BUN 27*  CREATININE 1.45*  CALCIUM 9.0   GFR: Estimated Creatinine Clearance: 67.3 mL/min (A) (by C-G formula based on SCr of 1.45 mg/dL (H)). Liver Function Tests: Recent Labs  Lab 08/27/21 1824  AST 82*  ALT 104*  ALKPHOS 213*  BILITOT 1.6*  PROT 7.9  ALBUMIN 2.6*   No results for input(s): "LIPASE", "AMYLASE" in the last 168 hours. No results for input(s): "AMMONIA" in the last 168 hours. Coagulation Profile: Recent Labs  Lab 08/27/21 1824  INR 1.2   Cardiac Enzymes: No results for input(s): "CKTOTAL", "CKMB", "CKMBINDEX", "TROPONINI" in the last 168 hours. BNP (last 3 results) No results for input(s): "PROBNP" in the last 8760 hours. HbA1C: No results for input(s): "HGBA1C" in the last 72 hours. CBG: No results for input(s): "GLUCAP" in the last 168 hours. Lipid Profile: No results for input(s): "CHOL", "HDL", "LDLCALC", "TRIG", "CHOLHDL", "LDLDIRECT" in the last 72 hours. Thyroid Function Tests: No results for input(s): "TSH", "T4TOTAL", "FREET4", "T3FREE", "THYROIDAB" in the last 72 hours. Anemia Panel: No results for input(s): "VITAMINB12", "FOLATE", "FERRITIN", "TIBC", "IRON", "RETICCTPCT" in the last 72  hours. Urine analysis:    Component Value Date/Time   COLORURINE AMBER (A) 08/27/2021 1548   APPEARANCEUR CLEAR 08/27/2021 1548   LABSPEC >1.046 (H) 08/27/2021 1548   PHURINE 5.0 08/27/2021 1548   GLUCOSEU NEGATIVE 08/27/2021 1548   HGBUR NEGATIVE 08/27/2021  [place] 08/27/2021 [place] 08/27/2021 [place] 08/27/2021 1548   NITRITE NEGATIVE 08/27/2021 1548   LEUKOCYTESUR NEGATIVE 08/27/2021 1548    Radiological Exams on Admission: DG Chest 2 View  Result Date: 08/27/2021 CLINICAL DATA:  Provided history: Possible sepsis. EXAM: CHEST - 2 VIEW COMPARISON:  Prior chest radiographs 04/09/2004 and earlier. FINDINGS: Shallow inspiration radiograph. Heart size within normal limits. Mild ill-defined opacity at both lung bases with an appearance most suggestive of atelectasis. No evidence of pleural effusion or pneumothorax. No acute bony abnormality identified. IMPRESSION: Shallow inspiration radiograph. Mild ill-defined opacity within both lung bases, with an appearance favoring atelectasis. Electronically Signed   By: Kellie Simmering D.O.   On: 08/27/2021 16:59    EKG: Independently reviewed. See above   Assessment/Plan Acute perforated diverticulitis with concern for abscess formation  -admit to med tele -place on broad spectrum abx with cefepime and metronidazole  -surgery consult called , awaiting recs  -infection disease consult for further assistance  -trend inflammatory markers -supportive care with antiemetic /pain medications / ivfs  -npo for now   Liver lesions  Elevated lfs  -abd mri to further evaluate due to concern for liver abscess vs metastatic disease -further consultation based on results of imaging    Abnormal cxr  with cough  -patient has been on doxcycline as outpatient due to initial concern for lyme disease  -would expect that CAP would be covered by this abx  -due to persistent cough  close to 2 weeks, liver  lesions will get dedicated CT chest  -send full respiratory panel and , sputum gram stain  CAD s/p MI  -s/p stent x2 last 2009 -continue metoprolol  asa  HLD -hold statin  due to elevated lfs   HTN  -hold HCTZ currently due to concern for  mild dehydration  -continue lisinopril monitor renal function closely   OSA -cpap qhs    hx of prostate ca -s/p prostatectomy    DVT prophylaxis: heparin Code Status: FULL Family Communication: none at beside Disposition Plan: patient  expected to be admitted greater than 2 midnights  Consults called: Surgery [place] Admission status: progressive    Clance Boll MD [facility]   If 7PM-7AM, please contact night-coverage [email or web address] Password [email or web address]  08/27/2021, 9:01 PM

## 2021-08-27 NOTE — ED Triage Notes (Signed)
Pt was sent by his PCP today after having an CT abd and pelvic today which showed a ruptured diverticulum. He repots 11 day hx of fever, mild abd pain.

## 2021-08-28 ENCOUNTER — Inpatient Hospital Stay (HOSPITAL_COMMUNITY): Payer: BC Managed Care – PPO

## 2021-08-28 DIAGNOSIS — K5780 Diverticulitis of intestine, part unspecified, with perforation and abscess without bleeding: Secondary | ICD-10-CM | POA: Diagnosis not present

## 2021-08-28 LAB — CBC
HCT: 33.7 % — ABNORMAL LOW (ref 39.0–52.0)
Hemoglobin: 11.4 g/dL — ABNORMAL LOW (ref 13.0–17.0)
MCH: 31.1 pg (ref 26.0–34.0)
MCHC: 33.8 g/dL (ref 30.0–36.0)
MCV: 92.1 fL (ref 80.0–100.0)
Platelets: 325 10*3/uL (ref 150–400)
RBC: 3.66 MIL/uL — ABNORMAL LOW (ref 4.22–5.81)
RDW: 13.8 % (ref 11.5–15.5)
WBC: 15 10*3/uL — ABNORMAL HIGH (ref 4.0–10.5)
nRBC: 0 % (ref 0.0–0.2)

## 2021-08-28 LAB — RESPIRATORY PANEL BY PCR

## 2021-08-28 LAB — COMPREHENSIVE METABOLIC PANEL
ALT: 90 U/L — ABNORMAL HIGH (ref 0–44)
AST: 68 U/L — ABNORMAL HIGH (ref 15–41)
Albumin: 2.2 g/dL — ABNORMAL LOW (ref 3.5–5.0)
Alkaline Phosphatase: 181 U/L — ABNORMAL HIGH (ref 38–126)
Anion gap: 13 (ref 5–15)
BUN: 25 mg/dL — ABNORMAL HIGH (ref 8–23)
CO2: 22 mmol/L (ref 22–32)
Calcium: 8.5 mg/dL — ABNORMAL LOW (ref 8.9–10.3)
Chloride: 100 mmol/L (ref 98–111)
Creatinine, Ser: 1.25 mg/dL — ABNORMAL HIGH (ref 0.61–1.24)
GFR, Estimated: >60 mL/min (ref >60)
Glucose, Bld: 200 mg/dL — ABNORMAL HIGH (ref 70–99)
Potassium: 3.8 mmol/L (ref 3.5–5.1)
Sodium: 135 mmol/L (ref 135–145)
Total Bilirubin: 1.2 mg/dL (ref 0.3–1.2)
Total Protein: 6.8 g/dL (ref 6.5–8.1)

## 2021-08-28 LAB — STREP PNEUMONIAE URINARY ANTIGEN: Strep Pneumo Urinary Antigen: NEGATIVE

## 2021-08-28 LAB — PROTIME-INR
INR: 1.2 (ref 0.8–1.2)
Prothrombin Time: 15.2 seconds (ref 11.4–15.2)

## 2021-08-28 LAB — URINE CULTURE: Culture: NO GROWTH

## 2021-08-28 LAB — LACTIC ACID, PLASMA: Lactic Acid, Venous: 1.7 mmol/L (ref 0.5–1.9)

## 2021-08-28 MED ORDER — SODIUM CHLORIDE 0.9% FLUSH
5.0000 mL | Freq: Three times a day (TID) | INTRAVENOUS | Status: DC
Start: 2021-08-28 — End: 2021-09-06
  Administered 2021-08-28 – 2021-09-06 (×25): 5 mL

## 2021-08-28 MED ORDER — MIDAZOLAM HCL 2 MG/2ML IJ SOLN
INTRAMUSCULAR | Status: AC | PRN
  Administered 2021-08-28: .5 mg via INTRAVENOUS
  Administered 2021-08-28 (×2): 1 mg via INTRAVENOUS
  Administered 2021-08-28: .5 mg via INTRAVENOUS

## 2021-08-28 MED ORDER — LIDOCAINE HCL 1 % IJ SOLN
INTRAMUSCULAR | Status: AC
  Filled 2021-08-28: qty 10

## 2021-08-28 MED ORDER — MIDAZOLAM HCL 2 MG/2ML IJ SOLN
INTRAMUSCULAR | Status: AC
  Filled 2021-08-28: qty 2

## 2021-08-28 MED ORDER — IOHEXOL 350 MG/ML SOLN
100.0000 mL | Freq: Once | INTRAVENOUS | Status: AC | PRN
Start: 2021-08-28 — End: 2021-08-28
  Administered 2021-08-28: 100 mL via INTRAVENOUS

## 2021-08-28 MED ORDER — FENTANYL CITRATE (PF) 100 MCG/2ML IJ SOLN
INTRAMUSCULAR | Status: AC | PRN
  Administered 2021-08-28: 25 ug via INTRAVENOUS
  Administered 2021-08-28: 50 ug via INTRAVENOUS
  Administered 2021-08-28: 25 ug via INTRAVENOUS
  Administered 2021-08-28: 50 ug via INTRAVENOUS

## 2021-08-28 MED ORDER — GADOBUTROL 1 MMOL/ML IV SOLN
10.0000 mL | Freq: Once | INTRAVENOUS | Status: AC | PRN
Start: 2021-08-28 — End: 2021-08-28
  Administered 2021-08-28: 10 mL via INTRAVENOUS

## 2021-08-28 MED ORDER — FENTANYL CITRATE (PF) 100 MCG/2ML IJ SOLN
INTRAMUSCULAR | Status: AC
  Filled 2021-08-28: qty 2

## 2021-08-28 MED ORDER — IOHEXOL 9 MG/ML PO SOLN
ORAL | Status: AC
  Filled 2021-08-28: qty 500

## 2021-08-28 MED ORDER — ACETAMINOPHEN 500 MG PO TABS
500.0000 mg | ORAL_TABLET | Freq: Four times a day (QID) | ORAL | Status: DC | PRN
  Administered 2021-08-28 – 2021-09-01 (×9): 500 mg via ORAL
  Filled 2021-08-28 (×8): qty 1

## 2021-08-28 NOTE — Procedures (Signed)
Interventional Radiology Procedure Note  Procedure: Image guided drain placement, pelvic drain, and liver abscess #1 in the pelvis,  54F pigtail drain.  Sample #1 #2 in the liver,  34F pigtail drain.   Sample #2  Complications: None  EBL: None Sample: Culture sent x 2  Recommendations: - Routine drain care, with sterile flushes, record output - follow up Cx - routine wound care  Signed,  Dulcy Fanny. Earleen Newport, DO

## 2021-08-28 NOTE — ED Notes (Signed)
Pt ambulated to chair. Pt is sitting in room in chair comfortably on monitor.

## 2021-08-28 NOTE — Progress Notes (Signed)
Subjective: Doesn't feel as well today from sleeping on ED stretcher.  More pain in RUQ after MRI and holding his breath etc.  Still no pain in his pelvis.    ROS: See above, otherwise other systems negative  Objective: Vital signs in last 24 hours: Temp:  [98.2 F (36.8 C)] 98.2 F (36.8 C) (07/10 1607) Pulse Rate:  [72-92] 83 (07/11 0500) Resp:  [10-25] 19 (07/11 0500) BP: (103-151)/(63-89) 136/77 (07/11 0500) SpO2:  [94 %-100 %] 95 % (07/11 0500) Weight:  [113.9 kg] 113.9 kg (07/10 1618)    Intake/Output from previous day: No intake/output data recorded. Intake/Output this shift: No intake/output data recorded.  PE: Gen: NAD, sitting up in a chair Heart: regular Lungs: respiratory effort nonlabored Abd: soft, obese, minimal tenderness in RUQ, but otherwise nontender, ND, reducible hernia noted.  Lab Results:  Recent Labs    08/27/21 2138 08/28/21 0300  WBC 14.8* 15.0*  HGB 11.5* 11.4*  HCT 34.9* 33.7*  PLT 312 325   BMET Recent Labs    08/27/21 1824 08/27/21 2138 08/28/21 0300  NA 135  --  135  K 3.6  --  3.8  CL 98  --  100  CO2 23  --  22  GLUCOSE 169*  --  200*  BUN 27*  --  25*  CREATININE 1.45* 1.36* 1.25*  CALCIUM 9.0  --  8.5*   PT/INR Recent Labs    08/27/21 1824 08/28/21 0300  LABPROT 14.7 15.2  INR 1.2 1.2   CMP     Component Value Date/Time   NA 135 08/28/2021 0300   K 3.8 08/28/2021 0300   CL 100 08/28/2021 0300   CO2 22 08/28/2021 0300   GLUCOSE 200 (H) 08/28/2021 0300   BUN 25 (H) 08/28/2021 0300   CREATININE 1.25 (H) 08/28/2021 0300   CALCIUM 8.5 (L) 08/28/2021 0300   PROT 6.8 08/28/2021 0300   ALBUMIN 2.2 (L) 08/28/2021 0300   AST 68 (H) 08/28/2021 0300   ALT 90 (H) 08/28/2021 0300   ALKPHOS 181 (H) 08/28/2021 0300   BILITOT 1.2 08/28/2021 0300   GFRNONAA >60 08/28/2021 0300   GFRAA 55 (L) 10/30/2019 1747   Lipase  No results found for: "LIPASE"     Studies/Results: MR ABDOMEN W WO CONTRAST  Result  Date: 08/28/2021 CLINICAL DATA:  61 year old male history of recent fever, cough and body ache. History of malignancy presenting with multiple liver lesions. EXAM: MRI ABDOMEN WITHOUT AND WITH CONTRAST TECHNIQUE: Multiplanar multisequence MR imaging of the abdomen was performed both before and after the administration of intravenous contrast. CONTRAST:  21m GADAVIST GADOBUTROL 1 MMOL/ML IV SOLN COMPARISON:  No priors. FINDINGS: Lower chest: Unremarkable. Hepatobiliary: There are numerous T1 hypointense, T2 hyperintense lesions scattered throughout the liver which demonstrate peripheral hypervascularity adjacent to the lesions, but no definite internal enhancement on post gadolinium imaging. These lesions the majority of these lesions appear to restrict diffusion. The largest of these lesions measure up to 5.0 x 3.7 x 4.3 cm in segment 4A of the liver (axial image 27 of series 19 and coronal image 66 of series 23). No intra or extrahepatic biliary ductal dilatation. Gallbladder is unremarkable in appearance. Pancreas: No pancreatic mass. No pancreatic ductal dilatation. No pancreatic or peripancreatic fluid collections or inflammatory changes. Spleen:  Unremarkable. Adrenals/Urinary Tract: Bilateral kidneys and adrenal glands are normal in appearance. No hydroureteronephrosis in the visualized portions of the abdomen. Stomach/Bowel: Visualized portions numerous colonic diverticulae are  noted in the visualized portions of the colon. Otherwise, unremarkable. Vascular/Lymphatic: No aneurysm identified in the visualized abdominal vasculature. No no lymphadenopathy noted in the abdomen. Other: No significant volume of ascites noted in the visualized portions of the peritoneal cavity. Musculoskeletal: No aggressive appearing osseous lesions are noted in the visualized portions of the skeleton. IMPRESSION: 1. Multiple aggressive appearing hepatic lesions with indeterminate imaging characteristics. Primary differential  considerations include multiple hepatic abscesses, however, the possibility of multifocal cystic/necrotic metastasis is not excluded. Further clinical evaluation is recommended. Electronically Signed   By: Vinnie Langton M.D.   On: 08/28/2021 05:42   DG Chest 2 View  Result Date: 08/27/2021 CLINICAL DATA:  Provided history: Possible sepsis. EXAM: CHEST - 2 VIEW COMPARISON:  Prior chest radiographs 04/09/2004 and earlier. FINDINGS: Shallow inspiration radiograph. Heart size within normal limits. Mild ill-defined opacity at both lung bases with an appearance most suggestive of atelectasis. No evidence of pleural effusion or pneumothorax. No acute bony abnormality identified. IMPRESSION: Shallow inspiration radiograph. Mild ill-defined opacity within both lung bases, with an appearance favoring atelectasis. Electronically Signed   By: Kellie Simmering D.O.   On: 08/27/2021 16:59    Anti-infectives: Anti-infectives (From admission, onward)    Start     Dose/Rate Route Frequency Ordered Stop   08/28/21 0400  ceFEPIme (MAXIPIME) 2 g in sodium chloride 0.9 % 100 mL IVPB        2 g 200 mL/hr over 30 Minutes Intravenous Every 8 hours 08/27/21 2103     08/27/21 2145  metroNIDAZOLE (FLAGYL) IVPB 500 mg        500 mg 100 mL/hr over 60 Minutes Intravenous Every 12 hours 08/27/21 2142     08/27/21 1730  ceFEPIme (MAXIPIME) 2 g in sodium chloride 0.9 % 100 mL IVPB        2 g 200 mL/hr over 30 Minutes Intravenous  Once 08/27/21 1721 08/27/21 1948   08/27/21 1730  metroNIDAZOLE (FLAGYL) IVPB 500 mg        500 mg 100 mL/hr over 60 Minutes Intravenous  Once 08/27/21 1721 08/27/21 2110        Assessment/Plan Diverticulitis with abscess and liver lesions abscess vs necrotic tumor -NPO -cont cefepime/flagyl, low threshold to transition to zosyn if needed, but will monitor  -WBC 15K, AF sense being in ED -IR eval for perc drain of intra-abdominal abscess as well as aspiration vs bx of liver lesions to  determine if these are infectious sequelae of diverticulitis or if he could have an underlying malignancy  -last colonoscopy was 2 years ago and reportedly negative  -cont to monitor and follow closely  FEN - NPO/IVFs VTE - ok for chemical prophylaxis from our standpoint ID - cefepime/flagyl  AKI CAD with hx of MI HTN HLD OSA Obesity   I reviewed hospitalist notes, last 24 h vitals and pain scores, last 48 h intake and output, last 24 h labs and trends, and last 24 h imaging results.   LOS: 1 day    Henreitta Cea , [facility] 08/28/2021, 8:02 AM Please see Amion for pager number during day hours 7:00am-4:30pm or 7:00am -11:30am on weekends

## 2021-08-28 NOTE — Consult Note (Signed)
Chief Complaint: Patient was seen in consultation today for RUQ pain at the request of General Surgery  Referring Physician(s): Saverio Danker, [place]  Supervising Physician: Corrie Mckusick  Patient Status: [facility] - ED  History of Present Illness: [name] [name] is a 61 y.o. male with 12 days of fevers and malaise. He initially thought it was a UTI and had started an antibiotic and then there was concern for lyme disease and was changed to doxycycline. His fevers were as high as 103.  Yesterday, his fevers were improved but he had severe fatigue and came to his doctor's office and then got tests and a CT scan done which showed diverticulitis with abscess.  He is accompanied by wife at bedside.   He has current RUQ abdominal pain. He never had nausea or diarrhea. He had small amount of blood in his urine once but that has resolved.  Endorses mild SOB.  Denies CP.  Endorses swelling in legs.   IR consulted for drainage of abscesses in liver and pelvis.  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 Has left to right collaterals. Last cath in 2009 with stent to to LAD. 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   CORONARY STENT PLACEMENT     LAD WITH A 3.0X15MM PROMUS DRUG-ELUTING STENT PLACED.   INGUINAL HERNIA REPAIR N/A 09/02/2019   Procedure: HERNIA  REPAIR UMBILICAL ,ADULT;  Surgeon: Raynelle Bring, MD;  Location: [facility];  Service: Urology;  Laterality: N/A;   KNEE ARTHROPLASTY Left 10/23/2017   Procedure: LEFT TOTAL KNEE ARTHROPLASTY WITH COMPUTER NAVIGATION;  Surgeon: Rod Can, MD;  Location: [facility];  Service: Orthopedics;  Laterality: Left;  Adductor Block   KNEE ARTHROSCOPY Left 1982   LYMPHADENECTOMY Bilateral 09/02/2019   Procedure: LYMPHADENECTOMY, PELVIC;  Surgeon: Raynelle Bring, MD;  Location: [facility];  Service: Urology;  Laterality: Bilateral;   ROBOT ASSISTED LAPAROSCOPIC RADICAL PROSTATECTOMY N/A 09/02/2019   Procedure: XI ROBOTIC ASSISTED LAPAROSCOPIC RADICAL PROSTATECTOMY LEVEL 2;  Surgeon: Raynelle Bring, MD;  Location: [facility];  Service: Urology;  Laterality: N/A;   TONSILLECTOMY     US ECHOCARDIOGRAPHY  03/14/2003   EF 55-60%    Allergies: Patient has no known allergies.  Medications: Prior to Admission medications   Medication Sig Start Date End Date Taking? Authorizing Provider  allopurinol (ZYLOPRIM) 100 MG tablet Take 100 mg by mouth daily.  05/05/19  Yes [provider]  doxycycline (VIBRA-TABS) 100 MG tablet Take 100 mg by mouth 2 (two) times daily. 08/23/21  Yes [provider]  hydrochlorothiazide (HYDRODIURIL) 25 MG tablet TAKE 1 TABLET (25 MG TOTAL) BY MOUTH DAILY. Patient taking differently: Take 25 mg by mouth  daily. 01/09/15  Yes Burtis Junes, NP  ibuprofen (ADVIL) 200 MG tablet Take 800 mg by mouth every 6 (six) hours as needed for mild pain.   Yes [provider]  lisinopril (PRINIVIL,ZESTRIL) 20 MG tablet TAKE 1 TABLET BY MOUTH EVERY DAY Patient taking differently: Take 20 mg by mouth daily. 07/04/14  Yes Larey Dresser, MD  meloxicam (MOBIC) 15 MG tablet Take 15 mg by mouth daily as needed for pain. 07/20/21  Yes [provider]  metoprolol succinate (TOPROL-XL) 25 MG 24 hr tablet Take 25 mg by mouth daily. 08/22/21  Yes [provider]  tadalafil (CIALIS)  5 MG tablet Take 5 mg by mouth daily. 08/20/21  Yes [provider]  metoprolol tartrate (LOPRESSOR) 25 MG tablet Take 1 tablet (25 mg total) by mouth 2 (two) times daily. Patient not taking: Reported on 08/27/2021 12/01/13   Larey Dresser, MD  pravastatin (PRAVACHOL) 40 MG tablet Take 40 mg by mouth daily. Patient not taking: Reported on 08/27/2021 08/20/17   [provider]  sulfamethoxazole-trimethoprim (BACTRIM DS) 800-160 MG tablet Take 1 tablet by mouth 2 (two) times daily. Start the day prior to foley removal appointment Patient not taking: Reported on 08/27/2021 09/02/19   Debbrah Alar, PA-C  traMADol (ULTRAM) 50 MG tablet Take 1-2 tablets (50-100 mg total) by mouth every 6 (six) hours as needed for moderate pain or severe pain. Patient not taking: Reported on 08/27/2021 09/02/19   Debbrah Alar, PA-C     Family History  Problem Relation Age of Onset   Hypertension Mother    Lung cancer Father        smoked three packs per day   Prostate cancer Neg Hx    Colon cancer Neg Hx    Pancreatic cancer Neg Hx    Breast cancer Neg Hx     Social History   Socioeconomic History   Marital status: Married    Spouse name: Renee   Number of children: 3   Years of education: Not on file   Highest education level: Not on file  Occupational History   Not on file  Tobacco Use   Smoking status: Never   Smokeless tobacco: Never  Vaping Use   Vaping Use: Never used  Substance and Sexual Activity   Alcohol use: Yes    Comment: occ    2-3 drinka a week   Drug use: Not Currently    Types: Marijuana    Comment: last week was last use THC CBD balm   Sexual activity: Not Currently    Birth control/protection: None  Other Topics Concern   Not on file  Social History Narrative   3 sons   Social Determinants of Health   Financial Resource Strain: Not on file  Food Insecurity: Not on file  Transportation Needs: Not on file  Physical Activity: Not on file  Stress: Not on  file  Social Connections: Not on file    Review of Systems: A 12 point ROS discussed and pertinent positives are indicated in the HPI above.  All other systems are negative.  Vital Signs: BP (!) 141/76   Pulse 78   Temp (!) 101 F (38.3 C) (Oral)   Resp 20   Ht 5' 10.5" (1.791 m)   Wt 251 lb (113.9 kg)   SpO2 97%   BMI 35.51 kg/m   Physical Exam Constitutional:      General: He is in acute distress.  HENT:  Mouth/Throat:     Mouth: Mucous membranes are dry.     Pharynx: Oropharynx is clear.  Eyes:     Extraocular Movements: Extraocular movements intact.  Cardiovascular:     Rate and Rhythm: Normal rate.     Pulses: Normal pulses.     Heart sounds: Normal heart sounds.  Pulmonary:     Breath sounds: Normal breath sounds.     Comments: Shallow inspirations Abdominal:     Palpations: Abdomen is soft.     Tenderness: There is abdominal tenderness. There is guarding.  Musculoskeletal:     Right lower leg: Edema present.     Left lower leg: Edema present.  Skin:    General: Skin is dry.     Capillary Refill: Capillary refill takes less than 2 seconds.     Coloration: Skin is pale.  Neurological:     General: No focal deficit present.     Mental Status: He is alert and oriented to person, place, and time.  Psychiatric:        Mood and Affect: Mood normal.        Behavior: Behavior normal.     Imaging: CT ABDOMEN PELVIS W CONTRAST  Result Date: 08/28/2021 CLINICAL DATA:  Hepatic lesions on recent MRI. EXAM: CT ABDOMEN AND PELVIS WITH CONTRAST TECHNIQUE: Multidetector CT imaging of the abdomen and pelvis was performed using the standard protocol following bolus administration of intravenous contrast. RADIATION DOSE REDUCTION: This exam was performed according to the departmental dose-optimization program which includes automated exposure control, adjustment of the mA and/or kV according to patient size and/or use of iterative reconstruction technique. CONTRAST:   139m OMNIPAQUE IOHEXOL 350 MG/ML SOLN COMPARISON:  MRI 08/28/2021 FINDINGS: Lower chest: Streaky areas of subsegmental atelectasis but no pleural effusions or pulmonary infiltrates. The heart is within normal limits in size. No pericardial effusion. Age advanced coronary artery calcifications are noted. Hepatobiliary: Numerous rim enhancing low-attenuation lesions throughout the liver most likely reflecting hepatic abscesses. No intrahepatic biliary dilatation. The portal and hepatic veins are patent. No common bile duct dilatation. Pancreas: No mass, inflammation or ductal dilatation. Spleen: Moderate splenomegaly. The spleen measures 16.5 x 13.4 x 11 cm. No splenic lesions. The splenic vein is patent. Adrenals/Urinary Tract: The adrenal glands are normal. Small renal calculi but no renal lesions or hydronephrosis. High attenuation material in the bladder could be related to a prior CT scan with contrast. Stomach/Bowel: The stomach, duodenum and small are unremarkable. Terminal ileum and appendix are normal. Distal descending and upper sigmoid colon diverticulitis with a large complex diverticular abscess or which is along the anterior aspect of the bladder and could potentially involve the bladder wall. This abscess measures approximately 6.1 x 4.8 x 4.3 cm. The remainder of the colon demonstrates diverticulosis but no mass or other areas of diverticulitis. Vascular/Lymphatic: Age advanced atherosclerotic calcification involving the aorta and branch vessels but no aneurysm or dissection. Numerous small mesenteric and retroperitoneal lymph nodes are likely reactive. Reproductive: Status post prostatectomy. Other: Mild mesenteric and retroperitoneal edema along with a small amount of free fluid mainly in the pelvis. Musculoskeletal: Bilateral pars defects at L5 with a grade 1 spondylolisthesis and advanced degenerative disc disease at L5-S1. No acute bony findings. IMPRESSION: 1. Numerous rim enhancing  low-attenuation lesions throughout the liver most likely reflecting hepatic abscesses. 2. Distal descending colon and upper sigmoid colon diverticulitis and large (6.1 x 4.8 x 4.3 cm) complex diverticular abscess along the anterior aspect of the bladder. 3. Moderate splenomegaly.  4. Age advanced atherosclerotic calcification involving the aorta and branch vessels and coronary arteries. 5. Status post prostatectomy. Aortic Atherosclerosis (ICD10-I70.0). Electronically Signed   By: Marijo Sanes M.D.   On: 08/28/2021 10:26   CT CHEST WO CONTRAST  Result Date: 08/28/2021 CLINICAL DATA:  Abnormal xray - lung opacity/opacities Liver masses, suspected malignancy. EXAM: CT CHEST WITHOUT CONTRAST TECHNIQUE: Multidetector CT imaging of the chest was performed following the standard protocol without IV contrast. RADIATION DOSE REDUCTION: This exam was performed according to the departmental dose-optimization program which includes automated exposure control, adjustment of the mA and/or kV according to patient size and/or use of iterative reconstruction technique. COMPARISON:  Correlation is made with a chest x-ray from yesterday and MRI of the abdomen performed on the same day earlier FINDINGS: Cardiovascular: Ascending thoracic aorta measures 4 cm in greatest dimension. Moderate atheromatous calcifications of the coronary arteries. Main pulmonary artery measures 3.2 cm and is dilated. Normal heart size. No pericardial effusion. Mediastinum/Nodes: No significantly enlarged mediastinal or axillary lymph nodes. Thyroid gland, trachea, and esophagus demonstrate no significant findings. Lungs/Pleura: No focal consolidation, pulmonary mass or discrete nodule seen. Mild bibasilar pleural pulmonary scarring. No pleural effusion or pneumothorax. Upper Abdomen: Again seen are several hypodense lesions in the liver and some of the lesions appear conglomerated. Spleen, pancreas, adrenals have a normal appearance. Musculoskeletal:  Mild-to-moderate thoracolumbar spondylosis with prominent anterior marginal osteophytes/syndesmophytes. IMPRESSION: 1. Ascending thoracic aorta measures 4 cm in greatest dimension. Pulmonary artery measures 3.2 cm and may reflect pulmonary hypertension. Recommend annual imaging followup by CTA or MRA. This recommendation follows 2010 ACCF/AHA/AATS/ACR/ASA/SCA/SCAI/SIR/STS/SVM Guidelines for the Diagnosis and Management of Patients with Thoracic Aortic Disease. Circulation. 2010; 121: O832-P498. Aortic aneurysm NOS (ICD10-I71.9) 2. No pulmonary mass or discrete nodule seen. No significant mediastinal/hilar lymphadenopathy. Mild bibasilar pleural pulmonary scarring. 3. There are several hypodense lesions in the liver with a differential considerations of hepatic abscesses versus cystic/necrotic metastasis (please refer to the MRI of the abdomen for detailed description). Electronically Signed   By: Frazier Richards M.D.   On: 08/28/2021 08:37   MR ABDOMEN W WO CONTRAST  Result Date: 08/28/2021 CLINICAL DATA:  61 year old male history of recent fever, cough and body ache. History of malignancy presenting with multiple liver lesions. EXAM: MRI ABDOMEN WITHOUT AND WITH CONTRAST TECHNIQUE: Multiplanar multisequence MR imaging of the abdomen was performed both before and after the administration of intravenous contrast. CONTRAST:  18m GADAVIST GADOBUTROL 1 MMOL/ML IV SOLN COMPARISON:  No priors. FINDINGS: Lower chest: Unremarkable. Hepatobiliary: There are numerous T1 hypointense, T2 hyperintense lesions scattered throughout the liver which demonstrate peripheral hypervascularity adjacent to the lesions, but no definite internal enhancement on post gadolinium imaging. These lesions the majority of these lesions appear to restrict diffusion. The largest of these lesions measure up to 5.0 x 3.7 x 4.3 cm in segment 4A of the liver (axial image 27 of series 19 and coronal image 66 of series 23). No intra or extrahepatic  biliary ductal dilatation. Gallbladder is unremarkable in appearance. Pancreas: No pancreatic mass. No pancreatic ductal dilatation. No pancreatic or peripancreatic fluid collections or inflammatory changes. Spleen:  Unremarkable. Adrenals/Urinary Tract: Bilateral kidneys and adrenal glands are normal in appearance. No hydroureteronephrosis in the visualized portions of the abdomen. Stomach/Bowel: Visualized portions numerous colonic diverticulae are noted in the visualized portions of the colon. Otherwise, unremarkable. Vascular/Lymphatic: No aneurysm identified in the visualized abdominal vasculature. No no lymphadenopathy noted in the abdomen. Other: No significant volume of ascites noted in the visualized  portions of the peritoneal cavity. Musculoskeletal: No aggressive appearing osseous lesions are noted in the visualized portions of the skeleton. IMPRESSION: 1. Multiple aggressive appearing hepatic lesions with indeterminate imaging characteristics. Primary differential considerations include multiple hepatic abscesses, however, the possibility of multifocal cystic/necrotic metastasis is not excluded. Further clinical evaluation is recommended. Electronically Signed   By: Vinnie Langton M.D.   On: 08/28/2021 05:42   DG Chest 2 View  Result Date: 08/27/2021 CLINICAL DATA:  Provided history: Possible sepsis. EXAM: CHEST - 2 VIEW COMPARISON:  Prior chest radiographs 04/09/2004 and earlier. FINDINGS: Shallow inspiration radiograph. Heart size within normal limits. Mild ill-defined opacity at both lung bases with an appearance most suggestive of atelectasis. No evidence of pleural effusion or pneumothorax. No acute bony abnormality identified. IMPRESSION: Shallow inspiration radiograph. Mild ill-defined opacity within both lung bases, with an appearance favoring atelectasis. Electronically Signed   By: Kellie Simmering D.O.   On: 08/27/2021 16:59    Labs:  CBC: Recent Labs    08/27/21 1824 08/27/21 2138  08/28/21 0300  WBC 15.9* 14.8* 15.0*  HGB 12.9* 11.5* 11.4*  HCT 38.8* 34.9* 33.7*  PLT 336 312 325    COAGS: Recent Labs    08/27/21 1824 08/28/21 0300  INR 1.2 1.2  APTT 28  --     BMP: Recent Labs    08/27/21 1824 08/27/21 2138 08/28/21 0300  NA 135  --  135  K 3.6  --  3.8  CL 98  --  100  CO2 23  --  22  GLUCOSE 169*  --  200*  BUN 27*  --  25*  CALCIUM 9.0  --  8.5*  CREATININE 1.45* 1.36* 1.25*  GFRNONAA 54* 59* >60    LIVER FUNCTION TESTS: Recent Labs    08/27/21 1824 08/28/21 0300  BILITOT 1.6* 1.2  AST 82* 68*  ALT 104* 90*  ALKPHOS 213* 181*  PROT 7.9 6.8  ALBUMIN 2.6* 2.2*    Assessment and Plan:  Diverticulitis with abscess formation in pelvis Liver abscess --apparent these abscesses are contributing to his illness.  They appear amenable to percutaneous image guided drainage and approved for procedure by Dr. Earleen Newport. --is NPO, labs, imaging, and history reviewed. --will plan for placement today.  Risks and benefits discussed with the patient including bleeding, infection, damage to adjacent structures, bowel perforation/fistula connection, and sepsis.  All of the patient's questions were answered, patient is agreeable to proceed. Consent signed and in chart.   Thank you for this interesting consult.  I greatly enjoyed meeting [name] [name] and look forward to participating in their care.  A copy of this report was sent to the requesting provider on this date.  Electronically Signed: Pasty Spillers, PA 08/28/2021, 12:47 PM   I spent a total of 30 minutes in face to face in clinical consultation, greater than 50% of which was counseling/coordinating care for diverticulitis.

## 2021-08-28 NOTE — Progress Notes (Signed)
PROGRESS NOTE    [name] [name]  BWI:[id number] DOB: [birth year] DOA: 08/27/2021 PCP: [facility]    Brief Narrative:  61 year old with history of coronary artery disease, hypertension hyperlipidemia obesity, history of sleep apnea, prostate cancer present to the ER from PCP office with perforated diverticulitis and associated small contained abscess.  He was treated for about 11 days with antibiotic for fever, body ache and low appetite.  CT scan also consistent with new liver lesions.  Admitted with surgical consultation.   Assessment & Plan:   Acute perforated sigmoid diverticulitis and contained abscess: Minimal lower abdominal symptoms, keeping n.p.o. for abnormal liver lesion.  Antibiotics with cefepime and Flagyl Stabilizing. Surgery following. Anticipate conservative management  Multiple liver lesions: MRI consistent with multiple liver lesion abscess versus metastatic disease.  We will attempt biopsy. We will discuss with interventional radiology, keep NPO.  Hepatic abscess versus necrotic metastatic lesion. Patient with respiratory symptoms for about 2 weeks, dedicated CT scan of the chest today. Start chest physiotherapy.  Coronary artery disease status post MI: On metoprolol and aspirin.  Hold aspirin.  Hyperlipidemia: Statin on hold  Hypertension: Lisinopril and metoprolol resumed.  Blood pressure stable.  Obstructive sleep apnea: CPAP at night  History of prostate cancer: Surgical resection.  Determined no metastatic disease at that time.  Subsequent PSAs were normal.      DVT prophylaxis:   Heparin.  We will discontinue for anticipated biopsy.   Code Status: Full code Family Communication: None Disposition Plan: Status is: Inpatient Remains inpatient appropriate because: IV antibiotics, inpatient procedures planned     Consultants:  General surgery Interventional radiology  Procedures:  None  Antimicrobials:   Cefepime and Flagyl 7/10---   Subjective: Patient seen and examined.  Cough with mucoid sputum present.  Now he is having some right upper quadrant abdominal pain he is not sure that this is because of coughing.  Uncomfortable night in the ER.  Afebrile. Last bowel movement before coming to the hospital, normal.  Denies any nausea vomiting. Discussed with with him about MRI findings, abscess versus other masses.   Objective: Vitals:   08/28/21 0300 08/28/21 0336 08/28/21 0435 08/28/21 0500  BP: (!) 147/75 (!) 151/77 (!) 141/80 136/77  Pulse: 81 83 77 83  Resp: 15 15 (!) 21 19  Temp:      TempSrc:      SpO2: 98% 97% 95% 95%  Weight:      Height:       No intake or output data in the 24 hours ending 08/28/21 0745 Filed Weights   08/27/21 1618  Weight: 113.9 kg    Examination:  General exam: Appears calm and comfortable.  On room air. Respiratory system: No added sound. Cardiovascular system: S1 & S2 heard, RRR. No pedal edema. Gastrointestinal system: Soft.  No definite tenderness.  Bowel sound present. Central nervous system: Alert and oriented. No focal neurological deficits. Extremities: Symmetric 5 x 5 power. Skin: No rashes, lesions or ulcers Psychiatry: Judgement and insight appear normal. Mood & affect appropriate.     Data Reviewed: I have personally reviewed following labs and imaging studies  CBC: Recent Labs  Lab 08/27/21 1824 08/27/21 2138 08/28/21 0300  WBC 15.9* 14.8* 15.0*  NEUTROABS 13.0*  --   --   HGB 12.9* 11.5* 11.4*  HCT 38.8* 34.9* 33.7*  MCV 92.6 94.1 92.1  PLT 336 312 638   Basic Metabolic Panel: Recent Labs  Lab 08/27/21 1824 08/27/21 2138 08/28/21  0300  NA 135  --  135  K 3.6  --  3.8  CL 98  --  100  CO2 23  --  22  GLUCOSE 169*  --  200*  BUN 27*  --  25*  CREATININE 1.45* 1.36* 1.25*  CALCIUM 9.0  --  8.5*   GFR: Estimated Creatinine Clearance: 78.1 mL/min (A) (by C-G formula based on SCr of 1.25 mg/dL (H)). Liver  Function Tests: Recent Labs  Lab 08/27/21 1824 08/28/21 0300  AST 82* 68*  ALT 104* 90*  ALKPHOS 213* 181*  BILITOT 1.6* 1.2  PROT 7.9 6.8  ALBUMIN 2.6* 2.2*   No results for input(s): "LIPASE", "AMYLASE" in the last 168 hours. No results for input(s): "AMMONIA" in the last 168 hours. Coagulation Profile: Recent Labs  Lab 08/27/21 1824 08/28/21 0300  INR 1.2 1.2   Cardiac Enzymes: No results for input(s): "CKTOTAL", "CKMB", "CKMBINDEX", "TROPONINI" in the last 168 hours. BNP (last 3 results) No results for input(s): "PROBNP" in the last 8760 hours. HbA1C: No results for input(s): "HGBA1C" in the last 72 hours. CBG: No results for input(s): "GLUCAP" in the last 168 hours. Lipid Profile: No results for input(s): "CHOL", "HDL", "LDLCALC", "TRIG", "CHOLHDL", "LDLDIRECT" in the last 72 hours. Thyroid Function Tests: No results for input(s): "TSH", "T4TOTAL", "FREET4", "T3FREE", "THYROIDAB" in the last 72 hours. Anemia Panel: No results for input(s): "VITAMINB12", "FOLATE", "FERRITIN", "TIBC", "IRON", "RETICCTPCT" in the last 72 hours. Sepsis Labs: Recent Labs  Lab 08/27/21 1824 08/27/21 1945 08/28/21 0319  LATICACIDVEN 1.8 1.7 1.7    Recent Results (from the past 240 hour(s))  Blood Culture (routine x 2)     Status: None (Preliminary result)   Collection Time: 08/27/21  4:22 PM   Specimen: BLOOD  Result Value Ref Range Status   Specimen Description BLOOD BLOOD LEFT FOREARM  Final   Special Requests   Final    BOTTLES DRAWN AEROBIC AND ANAEROBIC Blood Culture results may not be optimal due to an inadequate volume of blood received in culture bottles   Culture   Final    NO GROWTH < 12 HOURS Performed at [facility], [place] [address], [place], [place] [address]    Report Status PENDING  Incomplete  Blood Culture (routine x 2)     Status: None (Preliminary result)   Collection Time: 08/27/21  6:24 PM   Specimen: BLOOD RIGHT FOREARM  Result Value Ref Range  Status   Specimen Description BLOOD RIGHT FOREARM  Final   Special Requests   Final    BOTTLES DRAWN AEROBIC AND ANAEROBIC Blood Culture results may not be optimal due to an inadequate volume of blood received in culture bottles   Culture   Final    NO GROWTH < 12 HOURS Performed at [facility], [place] [address] [place], [place] [address]    Report Status PENDING  Incomplete  Respiratory (~20 pathogens) panel by PCR     Status: None   Collection Time: 08/28/21  3:42 AM   Specimen: Nasopharyngeal Swab; Respiratory  Result Value Ref Range Status   Adenovirus NOT DETECTED NOT DETECTED Final   Coronavirus 229E NOT DETECTED NOT DETECTED Final    Comment: (NOTE) The Coronavirus on the Respiratory Panel, DOES NOT test for the novel  Coronavirus (2019 nCoV)    Coronavirus HKU1 NOT DETECTED NOT DETECTED Final   Coronavirus NL63 NOT DETECTED NOT DETECTED Final   Coronavirus OC43 NOT DETECTED NOT DETECTED Final  Metapneumovirus NOT DETECTED NOT DETECTED Final   Rhinovirus / Enterovirus NOT DETECTED NOT DETECTED Final   Influenza A NOT DETECTED NOT DETECTED Final   Influenza B NOT DETECTED NOT DETECTED Final   Parainfluenza Virus 1 NOT DETECTED NOT DETECTED Final   Parainfluenza Virus 2 NOT DETECTED NOT DETECTED Final   Parainfluenza Virus 3 NOT DETECTED NOT DETECTED Final   Parainfluenza Virus 4 NOT DETECTED NOT DETECTED Final   Respiratory Syncytial Virus NOT DETECTED NOT DETECTED Final   Bordetella pertussis NOT DETECTED NOT DETECTED Final   Bordetella Parapertussis NOT DETECTED NOT DETECTED Final   Chlamydophila pneumoniae NOT DETECTED NOT DETECTED Final   Mycoplasma pneumoniae NOT DETECTED NOT DETECTED Final    Comment: Performed at [facility], [place] [address], [place], [place] [address]         Radiology Studies: MR ABDOMEN W WO CONTRAST  Result Date: 08/28/2021 CLINICAL DATA:  61 year old male history of recent fever, cough and body ache. History of malignancy  presenting with multiple liver lesions. EXAM: MRI ABDOMEN WITHOUT AND WITH CONTRAST TECHNIQUE: Multiplanar multisequence MR imaging of the abdomen was performed both before and after the administration of intravenous contrast. CONTRAST:  50m GADAVIST GADOBUTROL 1 MMOL/ML IV SOLN COMPARISON:  No priors. FINDINGS: Lower chest: Unremarkable. Hepatobiliary: There are numerous T1 hypointense, T2 hyperintense lesions scattered throughout the liver which demonstrate peripheral hypervascularity adjacent to the lesions, but no definite internal enhancement on post gadolinium imaging. These lesions the majority of these lesions appear to restrict diffusion. The largest of these lesions measure up to 5.0 x 3.7 x 4.3 cm in segment 4A of the liver (axial image 27 of series 19 and coronal image 66 of series 23). No intra or extrahepatic biliary ductal dilatation. Gallbladder is unremarkable in appearance. Pancreas: No pancreatic mass. No pancreatic ductal dilatation. No pancreatic or peripancreatic fluid collections or inflammatory changes. Spleen:  Unremarkable. Adrenals/Urinary Tract: Bilateral kidneys and adrenal glands are normal in appearance. No hydroureteronephrosis in the visualized portions of the abdomen. Stomach/Bowel: Visualized portions numerous colonic diverticulae are noted in the visualized portions of the colon. Otherwise, unremarkable. Vascular/Lymphatic: No aneurysm identified in the visualized abdominal vasculature. No no lymphadenopathy noted in the abdomen. Other: No significant volume of ascites noted in the visualized portions of the peritoneal cavity. Musculoskeletal: No aggressive appearing osseous lesions are noted in the visualized portions of the skeleton. IMPRESSION: 1. Multiple aggressive appearing hepatic lesions with indeterminate imaging characteristics. Primary differential considerations include multiple hepatic abscesses, however, the possibility of multifocal cystic/necrotic metastasis is  not excluded. Further clinical evaluation is recommended. Electronically Signed   By: DVinnie LangtonM.D.   On: 08/28/2021 05:42   DG Chest 2 View  Result Date: 08/27/2021 CLINICAL DATA:  Provided history: Possible sepsis. EXAM: CHEST - 2 VIEW COMPARISON:  Prior chest radiographs 04/09/2004 and earlier. FINDINGS: Shallow inspiration radiograph. Heart size within normal limits. Mild ill-defined opacity at both lung bases with an appearance most suggestive of atelectasis. No evidence of pleural effusion or pneumothorax. No acute bony abnormality identified. IMPRESSION: Shallow inspiration radiograph. Mild ill-defined opacity within both lung bases, with an appearance favoring atelectasis. Electronically Signed   By: KKellie SimmeringD.O.   On: 08/27/2021 16:59        Scheduled Meds:  lisinopril  20 mg Oral Daily   metoprolol succinate  25 mg Oral Daily   pantoprazole (PROTONIX) IV  40 mg Intravenous Q24H   Continuous Infusions:  ceFEPime (MAXIPIME) IV Stopped (08/28/21 00160  lactated ringers 150 mL/hr at 08/28/21 0224   metronidazole Stopped (08/27/21 2308)     LOS: 1 day    Time spent: 45 minutes    Barb Merino, MD [facility] Pager [phone]

## 2021-08-28 NOTE — ED Notes (Signed)
Report received from [place], [place]

## 2021-08-29 ENCOUNTER — Inpatient Hospital Stay (HOSPITAL_COMMUNITY): Payer: BC Managed Care – PPO

## 2021-08-29 DIAGNOSIS — R609 Edema, unspecified: Secondary | ICD-10-CM

## 2021-08-29 DIAGNOSIS — K5780 Diverticulitis of intestine, part unspecified, with perforation and abscess without bleeding: Secondary | ICD-10-CM | POA: Diagnosis not present

## 2021-08-29 LAB — CBC WITH DIFFERENTIAL/PLATELET
Abs Immature Granulocytes: 0.18 10*3/uL — ABNORMAL HIGH (ref 0.00–0.07)
Basophils Absolute: 0 10*3/uL (ref 0.0–0.1)
Basophils Relative: 0 %
Eosinophils Absolute: 0 10*3/uL (ref 0.0–0.5)
Eosinophils Relative: 0 %
HCT: 32.7 % — ABNORMAL LOW (ref 39.0–52.0)
Hemoglobin: 11.1 g/dL — ABNORMAL LOW (ref 13.0–17.0)
Immature Granulocytes: 1 %
Lymphocytes Relative: 7 %
Lymphs Abs: 0.9 10*3/uL (ref 0.7–4.0)
MCH: 31.1 pg (ref 26.0–34.0)
MCHC: 33.9 g/dL (ref 30.0–36.0)
MCV: 91.6 fL (ref 80.0–100.0)
Monocytes Absolute: 0.8 10*3/uL (ref 0.1–1.0)
Monocytes Relative: 5 %
Neutro Abs: 12.1 10*3/uL — ABNORMAL HIGH (ref 1.7–7.7)
Neutrophils Relative %: 87 %
Platelets: 320 10*3/uL (ref 150–400)
RBC: 3.57 MIL/uL — ABNORMAL LOW (ref 4.22–5.81)
RDW: 13.8 % (ref 11.5–15.5)
WBC: 14 10*3/uL — ABNORMAL HIGH (ref 4.0–10.5)
nRBC: 0 % (ref 0.0–0.2)

## 2021-08-29 LAB — COMPREHENSIVE METABOLIC PANEL
ALT: 67 U/L — ABNORMAL HIGH (ref 0–44)
AST: 46 U/L — ABNORMAL HIGH (ref 15–41)
Albumin: 2.1 g/dL — ABNORMAL LOW (ref 3.5–5.0)
Alkaline Phosphatase: 140 U/L — ABNORMAL HIGH (ref 38–126)
Anion gap: 10 (ref 5–15)
BUN: 21 mg/dL (ref 8–23)
CO2: 20 mmol/L — ABNORMAL LOW (ref 22–32)
Calcium: 8.3 mg/dL — ABNORMAL LOW (ref 8.9–10.3)
Chloride: 104 mmol/L (ref 98–111)
Creatinine, Ser: 1.37 mg/dL — ABNORMAL HIGH (ref 0.61–1.24)
GFR, Estimated: 58 mL/min — ABNORMAL LOW (ref >60)
Glucose, Bld: 201 mg/dL — ABNORMAL HIGH (ref 70–99)
Potassium: 4.1 mmol/L (ref 3.5–5.1)
Sodium: 134 mmol/L — ABNORMAL LOW (ref 135–145)
Total Bilirubin: 1.4 mg/dL — ABNORMAL HIGH (ref 0.3–1.2)
Total Protein: 6.8 g/dL (ref 6.5–8.1)

## 2021-08-29 LAB — PROCALCITONIN: Procalcitonin: 1.15 ng/mL

## 2021-08-29 LAB — LEGIONELLA PNEUMOPHILA SEROGP 1 UR AG: L. pneumophila Serogp 1 Ur Ag: NEGATIVE

## 2021-08-29 LAB — PHOSPHORUS: Phosphorus: 3.1 mg/dL (ref 2.5–4.6)

## 2021-08-29 LAB — MAGNESIUM: Magnesium: 1.8 mg/dL (ref 1.7–2.4)

## 2021-08-29 MED ORDER — PIPERACILLIN-TAZOBACTAM 3.375 G IVPB
3.3750 g | Freq: Three times a day (TID) | INTRAVENOUS | Status: DC
  Administered 2021-08-29 – 2021-09-01 (×10): 3.375 g via INTRAVENOUS
  Filled 2021-08-29 (×11): qty 50

## 2021-08-29 MED ORDER — HEPARIN SODIUM (PORCINE) 5000 UNIT/ML IJ SOLN
5000.0000 [IU] | Freq: Three times a day (TID) | INTRAMUSCULAR | Status: DC
  Administered 2021-08-29 – 2021-09-06 (×23): 5000 [IU] via SUBCUTANEOUS
  Filled 2021-08-29 (×23): qty 1

## 2021-08-29 NOTE — Progress Notes (Signed)
Mobility Specialist Progress Note:   08/29/21 1455  Mobility  Activity Ambulated independently in hallway  Level of Assistance Standby assist, set-up cues, supervision of patient - no hands on  Assistive Device None  Distance Ambulated (ft) 550 ft  Activity Response Tolerated well  $Mobility charge 1 Mobility   Pt agreeable to mobility session. No physical assist required. Pt shivering at EOS, NT made aware. Pt left in chair with all needs met.   [facility] Secure Chat or Office Phone: [phone]  

## 2021-08-29 NOTE — Progress Notes (Signed)
Bilateral LE venous duplex study completed. Please see CV Proc for preliminary results.  Praise Stennett BS, RVT 08/29/2021 4:30 PM

## 2021-08-29 NOTE — Progress Notes (Signed)
Subjective: Subjectively feels slightly better today. WBC 14. Underwent drain placement in IR.   Objective: Vital signs in last 24 hours: Temp:  [98.1 F (36.7 C)-101.2 F (38.4 C)] 99.5 F (37.5 C) (07/12 0811) Pulse Rate:  [71-83] 80 (07/12 0811) Resp:  [14-34] 19 (07/12 0811) BP: (114-143)/(55-76) 136/75 (07/12 0811) SpO2:  [93 %-100 %] 93 % (07/12 0811) Weight:  [120.4 kg] 120.4 kg (07/12 0431) Last BM Date : 08/29/21  Intake/Output from previous day: 07/11 0701 - 07/12 0700 In: 530.9 [IV Piggyback:525.9] Out: 52 [Urine:175; Drains:90] Intake/Output this shift: Total I/O In: 5 [I.V.:5] Out: 900 [Urine:900]  PE: Gen: NAD, sitting up in a chair Heart: regular Lungs: respiratory effort nonlabored Abd: soft, mildly distended, nontender to palpation. Pelvic drain and RUQ drain with serosanguinous fluid.  Lab Results:  Recent Labs    08/28/21 0300 08/29/21 0236  WBC 15.0* 14.0*  HGB 11.4* 11.1*  HCT 33.7* 32.7*  PLT 325 320   BMET Recent Labs    08/28/21 0300 08/29/21 0236  NA 135 134*  K 3.8 4.1  CL 100 104  CO2 22 20*  GLUCOSE 200* 201*  BUN 25* 21  CREATININE 1.25* 1.37*  CALCIUM 8.5* 8.3*   PT/INR Recent Labs    08/27/21 1824 08/28/21 0300  LABPROT 14.7 15.2  INR 1.2 1.2   CMP     Component Value Date/Time   NA 134 (L) 08/29/2021 0236   K 4.1 08/29/2021 0236   CL 104 08/29/2021 0236   CO2 20 (L) 08/29/2021 0236   GLUCOSE 201 (H) 08/29/2021 0236   BUN 21 08/29/2021 0236   CREATININE 1.37 (H) 08/29/2021 0236   CALCIUM 8.3 (L) 08/29/2021 0236   PROT 6.8 08/29/2021 0236   ALBUMIN 2.1 (L) 08/29/2021 0236   AST 46 (H) 08/29/2021 0236   ALT 67 (H) 08/29/2021 0236   ALKPHOS 140 (H) 08/29/2021 0236   BILITOT 1.4 (H) 08/29/2021 0236   GFRNONAA 58 (L) 08/29/2021 0236   GFRAA 55 (L) 10/30/2019 1747   Lipase  No results found for: "LIPASE"     Studies/Results: CT IMAGE GUIDED DRAINAGE BY PERCUTANEOUS CATHETER  Result Date:  08/29/2021 INDICATION: 61 year old male referred for percutaneous drainage of liver abscess and pelvic abscess, diverticulitis EXAM: IMAGE GUIDED DRAINAGE OF LIVER ABSCESS IMAGE GUIDED DRAINAGE OF PELVIC ABSCESS TECHNIQUE: Multidetector CT imaging of the abdomen was performed following the standard protocol without IV contrast. RADIATION DOSE REDUCTION: This exam was performed according to the departmental dose-optimization program which includes automated exposure control, adjustment of the mA and/or kV according to patient size and/or use of iterative reconstruction technique. MEDICATIONS: The patient is currently admitted to the hospital and receiving intravenous antibiotics. The antibiotics were administered within an appropriate time frame prior to the initiation of the procedure. ANESTHESIA/SEDATION: Moderate (conscious) sedation was employed during this procedure. A total of Versed 3.0 mg and Fentanyl 150 mcg was administered intravenously by the radiology nurse. Total intra-service moderate Sedation Time: 37 minutes. The patient's level of consciousness and vital signs were monitored continuously by radiology nursing throughout the procedure under my direct supervision. COMPLICATIONS: None PROCEDURE: Informed written consent was obtained from the patient after a thorough discussion of the procedural risks, benefits and alternatives. All questions were addressed. Maximal Sterile Barrier Technique was utilized including caps, mask, sterile gowns, sterile gloves, sterile drape, hand hygiene and skin antiseptic. A timeout was performed prior to the initiation of the procedure. Patient positioned supine position on  CT gantry table. Scout CT was acquired for planning purposes. Ultrasound images of the liver were acquired for planning purposes. The patient was then prepped and draped in the usual sterile fashion, in the low anterior pelvic region as well as the subcostal region in the upper abdomen. We initiated  placement of the pelvic drain. Once we acquired appropriate trajectory for needle placement, 1% lidocaine was used for local anesthesia. Small stab incision was made with 11 blade scalpel a Yueh needle was advanced under CT guidance into the abscess anterior to the urinary bladder. Once we confirmed the catheter position modified Seldinger technique was then used to place a 10 [place] drain into the abscess. Aspiration of approximately 10 cc of frankly purulent material was performed. Catheter was sutured in position and attached to bulb suction. Final image was acquired. Sample was sent for analysis, from what we labeled as drain 1. Ultrasound guidance was then used to place a 12 [place] drain with trocar technique into the anterior located abscess within segment 3 of the liver the more cranial located abscess cavities were not as well visualized given the position under the diaphragm and behind the ribs. Approximately 15 cc of purulent material was aspirated with placement of the 12 French drain. Drain was sutured in position and attached to bulb suction. Final image was acquired. Patient tolerated the procedure well and remained hemodynamically stable throughout. No complications were encountered and no significant blood loss. IMPRESSION: Status post image guided placement of pelvic drain into super cystic abscess, as well as liver drain into segment 3 liver abscess. Culture was sent from both sites. Signed, Dulcy Fanny. Nadene Rubins, RPVI [facility] Radiology Electronically Signed   By: Corrie Mckusick D.O.   On: 08/29/2021 09:53   CT IMAGE GUIDED DRAINAGE PERCUT CATH  PERITONEAL RETROPERIT  Result Date: 08/29/2021 INDICATION: 61 year old male referred for percutaneous drainage of liver abscess and pelvic abscess, diverticulitis EXAM: IMAGE GUIDED DRAINAGE OF LIVER ABSCESS IMAGE GUIDED DRAINAGE OF PELVIC ABSCESS TECHNIQUE: Multidetector CT imaging of the abdomen  was performed following the standard protocol without IV contrast. RADIATION DOSE REDUCTION: This exam was performed according to the departmental dose-optimization program which includes automated exposure control, adjustment of the mA and/or kV according to patient size and/or use of iterative reconstruction technique. MEDICATIONS: The patient is currently admitted to the hospital and receiving intravenous antibiotics. The antibiotics were administered within an appropriate time frame prior to the initiation of the procedure. ANESTHESIA/SEDATION: Moderate (conscious) sedation was employed during this procedure. A total of Versed 3.0 mg and Fentanyl 150 mcg was administered intravenously by the radiology nurse. Total intra-service moderate Sedation Time: 37 minutes. The patient's level of consciousness and vital signs were monitored continuously by radiology nursing throughout the procedure under my direct supervision. COMPLICATIONS: None PROCEDURE: Informed written consent was obtained from the patient after a thorough discussion of the procedural risks, benefits and alternatives. All questions were addressed. Maximal Sterile Barrier Technique was utilized including caps, mask, sterile gowns, sterile gloves, sterile drape, hand hygiene and skin antiseptic. A timeout was performed prior to the initiation of the procedure. Patient positioned supine position on CT gantry table. Scout CT was acquired for planning purposes. Ultrasound images of the liver were acquired for planning purposes. The patient was then prepped and draped in the usual sterile fashion, in the low anterior pelvic region as well as the subcostal region in the upper abdomen. We initiated placement of the pelvic drain. Once we  acquired appropriate trajectory for needle placement, 1% lidocaine was used for local anesthesia. Small stab incision was made with 11 blade scalpel a Yueh needle was advanced under CT guidance into the abscess anterior to the  urinary bladder. Once we confirmed the catheter position modified Seldinger technique was then used to place a 10 [place] drain into the abscess. Aspiration of approximately 10 cc of frankly purulent material was performed. Catheter was sutured in position and attached to bulb suction. Final image was acquired. Sample was sent for analysis, from what we labeled as drain 1. Ultrasound guidance was then used to place a 12 [place] drain with trocar technique into the anterior located abscess within segment 3 of the liver the more cranial located abscess cavities were not as well visualized given the position under the diaphragm and behind the ribs. Approximately 15 cc of purulent material was aspirated with placement of the 12 French drain. Drain was sutured in position and attached to bulb suction. Final image was acquired. Patient tolerated the procedure well and remained hemodynamically stable throughout. No complications were encountered and no significant blood loss. IMPRESSION: Status post image guided placement of pelvic drain into super cystic abscess, as well as liver drain into segment 3 liver abscess. Culture was sent from both sites. Signed, Dulcy Fanny. Nadene Rubins, RPVI [facility] Radiology Electronically Signed   By: Corrie Mckusick D.O.   On: 08/29/2021 09:53   CT ABDOMEN PELVIS W CONTRAST  Result Date: 08/28/2021 CLINICAL DATA:  Hepatic lesions on recent MRI. EXAM: CT ABDOMEN AND PELVIS WITH CONTRAST TECHNIQUE: Multidetector CT imaging of the abdomen and pelvis was performed using the standard protocol following bolus administration of intravenous contrast. RADIATION DOSE REDUCTION: This exam was performed according to the departmental dose-optimization program which includes automated exposure control, adjustment of the mA and/or kV according to patient size and/or use of iterative reconstruction technique. CONTRAST:  178m OMNIPAQUE IOHEXOL 350 MG/ML  SOLN COMPARISON:  MRI 08/28/2021 FINDINGS: Lower chest: Streaky areas of subsegmental atelectasis but no pleural effusions or pulmonary infiltrates. The heart is within normal limits in size. No pericardial effusion. Age advanced coronary artery calcifications are noted. Hepatobiliary: Numerous rim enhancing low-attenuation lesions throughout the liver most likely reflecting hepatic abscesses. No intrahepatic biliary dilatation. The portal and hepatic veins are patent. No common bile duct dilatation. Pancreas: No mass, inflammation or ductal dilatation. Spleen: Moderate splenomegaly. The spleen measures 16.5 x 13.4 x 11 cm. No splenic lesions. The splenic vein is patent. Adrenals/Urinary Tract: The adrenal glands are normal. Small renal calculi but no renal lesions or hydronephrosis. High attenuation material in the bladder could be related to a prior CT scan with contrast. Stomach/Bowel: The stomach, duodenum and small are unremarkable. Terminal ileum and appendix are normal. Distal descending and upper sigmoid colon diverticulitis with a large complex diverticular abscess or which is along the anterior aspect of the bladder and could potentially involve the bladder wall. This abscess measures approximately 6.1 x 4.8 x 4.3 cm. The remainder of the colon demonstrates diverticulosis but no mass or other areas of diverticulitis. Vascular/Lymphatic: Age advanced atherosclerotic calcification involving the aorta and branch vessels but no aneurysm or dissection. Numerous small mesenteric and retroperitoneal lymph nodes are likely reactive. Reproductive: Status post prostatectomy. Other: Mild mesenteric and retroperitoneal edema along with a small amount of free fluid mainly in the pelvis. Musculoskeletal: Bilateral pars defects at L5 with a grade 1 spondylolisthesis and advanced degenerative disc disease at L5-S1. No  acute bony findings. IMPRESSION: 1. Numerous rim enhancing low-attenuation lesions throughout the liver  most likely reflecting hepatic abscesses. 2. Distal descending colon and upper sigmoid colon diverticulitis and large (6.1 x 4.8 x 4.3 cm) complex diverticular abscess along the anterior aspect of the bladder. 3. Moderate splenomegaly. 4. Age advanced atherosclerotic calcification involving the aorta and branch vessels and coronary arteries. 5. Status post prostatectomy. Aortic Atherosclerosis (ICD10-I70.0). Electronically Signed   By: Marijo Sanes M.D.   On: 08/28/2021 10:26   CT CHEST WO CONTRAST  Result Date: 08/28/2021 CLINICAL DATA:  Abnormal xray - lung opacity/opacities Liver masses, suspected malignancy. EXAM: CT CHEST WITHOUT CONTRAST TECHNIQUE: Multidetector CT imaging of the chest was performed following the standard protocol without IV contrast. RADIATION DOSE REDUCTION: This exam was performed according to the departmental dose-optimization program which includes automated exposure control, adjustment of the mA and/or kV according to patient size and/or use of iterative reconstruction technique. COMPARISON:  Correlation is made with a chest x-ray from yesterday and MRI of the abdomen performed on the same day earlier FINDINGS: Cardiovascular: Ascending thoracic aorta measures 4 cm in greatest dimension. Moderate atheromatous calcifications of the coronary arteries. Main pulmonary artery measures 3.2 cm and is dilated. Normal heart size. No pericardial effusion. Mediastinum/Nodes: No significantly enlarged mediastinal or axillary lymph nodes. Thyroid gland, trachea, and esophagus demonstrate no significant findings. Lungs/Pleura: No focal consolidation, pulmonary mass or discrete nodule seen. Mild bibasilar pleural pulmonary scarring. No pleural effusion or pneumothorax. Upper Abdomen: Again seen are several hypodense lesions in the liver and some of the lesions appear conglomerated. Spleen, pancreas, adrenals have a normal appearance. Musculoskeletal: Mild-to-moderate thoracolumbar spondylosis with  prominent anterior marginal osteophytes/syndesmophytes. IMPRESSION: 1. Ascending thoracic aorta measures 4 cm in greatest dimension. Pulmonary artery measures 3.2 cm and may reflect pulmonary hypertension. Recommend annual imaging followup by CTA or MRA. This recommendation follows 2010 ACCF/AHA/AATS/ACR/ASA/SCA/SCAI/SIR/STS/SVM Guidelines for the Diagnosis and Management of Patients with Thoracic Aortic Disease. Circulation. 2010; 121: H962-I297. Aortic aneurysm NOS (ICD10-I71.9) 2. No pulmonary mass or discrete nodule seen. No significant mediastinal/hilar lymphadenopathy. Mild bibasilar pleural pulmonary scarring. 3. There are several hypodense lesions in the liver with a differential considerations of hepatic abscesses versus cystic/necrotic metastasis (please refer to the MRI of the abdomen for detailed description). Electronically Signed   By: Frazier Richards M.D.   On: 08/28/2021 08:37   MR ABDOMEN W WO CONTRAST  Result Date: 08/28/2021 CLINICAL DATA:  61 year old male history of recent fever, cough and body ache. History of malignancy presenting with multiple liver lesions. EXAM: MRI ABDOMEN WITHOUT AND WITH CONTRAST TECHNIQUE: Multiplanar multisequence MR imaging of the abdomen was performed both before and after the administration of intravenous contrast. CONTRAST:  30m GADAVIST GADOBUTROL 1 MMOL/ML IV SOLN COMPARISON:  No priors. FINDINGS: Lower chest: Unremarkable. Hepatobiliary: There are numerous T1 hypointense, T2 hyperintense lesions scattered throughout the liver which demonstrate peripheral hypervascularity adjacent to the lesions, but no definite internal enhancement on post gadolinium imaging. These lesions the majority of these lesions appear to restrict diffusion. The largest of these lesions measure up to 5.0 x 3.7 x 4.3 cm in segment 4A of the liver (axial image 27 of series 19 and coronal image 66 of series 23). No intra or extrahepatic biliary ductal dilatation. Gallbladder is  unremarkable in appearance. Pancreas: No pancreatic mass. No pancreatic ductal dilatation. No pancreatic or peripancreatic fluid collections or inflammatory changes. Spleen:  Unremarkable. Adrenals/Urinary Tract: Bilateral kidneys and adrenal glands are normal in appearance. No hydroureteronephrosis in  the visualized portions of the abdomen. Stomach/Bowel: Visualized portions numerous colonic diverticulae are noted in the visualized portions of the colon. Otherwise, unremarkable. Vascular/Lymphatic: No aneurysm identified in the visualized abdominal vasculature. No no lymphadenopathy noted in the abdomen. Other: No significant volume of ascites noted in the visualized portions of the peritoneal cavity. Musculoskeletal: No aggressive appearing osseous lesions are noted in the visualized portions of the skeleton. IMPRESSION: 1. Multiple aggressive appearing hepatic lesions with indeterminate imaging characteristics. Primary differential considerations include multiple hepatic abscesses, however, the possibility of multifocal cystic/necrotic metastasis is not excluded. Further clinical evaluation is recommended. Electronically Signed   By: Vinnie Langton M.D.   On: 08/28/2021 05:42   DG Chest 2 View  Result Date: 08/27/2021 CLINICAL DATA:  Provided history: Possible sepsis. EXAM: CHEST - 2 VIEW COMPARISON:  Prior chest radiographs 04/09/2004 and earlier. FINDINGS: Shallow inspiration radiograph. Heart size within normal limits. Mild ill-defined opacity at both lung bases with an appearance most suggestive of atelectasis. No evidence of pleural effusion or pneumothorax. No acute bony abnormality identified. IMPRESSION: Shallow inspiration radiograph. Mild ill-defined opacity within both lung bases, with an appearance favoring atelectasis. Electronically Signed   By: Kellie Simmering D.O.   On: 08/27/2021 16:59    Anti-infectives: Anti-infectives (From admission, onward)    Start     Dose/Rate Route Frequency  Ordered Stop   08/29/21 1200  piperacillin-tazobactam (ZOSYN) IVPB 3.375 g        3.375 g 12.5 mL/hr over 240 Minutes Intravenous Every 8 hours 08/29/21 0928     08/28/21 0400  ceFEPIme (MAXIPIME) 2 g in sodium chloride 0.9 % 100 mL IVPB  Status:  Discontinued        2 g 200 mL/hr over 30 Minutes Intravenous Every 8 hours 08/27/21 2103 08/29/21 0844   08/27/21 2145  metroNIDAZOLE (FLAGYL) IVPB 500 mg  Status:  Discontinued        500 mg 100 mL/hr over 60 Minutes Intravenous Every 12 hours 08/27/21 2142 08/29/21 0844   08/27/21 1730  ceFEPIme (MAXIPIME) 2 g in sodium chloride 0.9 % 100 mL IVPB        2 g 200 mL/hr over 30 Minutes Intravenous  Once 08/27/21 1721 08/27/21 1948   08/27/21 1730  metroNIDAZOLE (FLAGYL) IVPB 500 mg        500 mg 100 mL/hr over 60 Minutes Intravenous  Once 08/27/21 1721 08/27/21 2110        Assessment/Plan Diverticulitis with abscess and liver lesions abscess vs necrotic tumor -Continue bowel rest -IV abx - agree with changing to Zosyn -s/p percutaneous drainage of pelvic abscess and one of the liver abscesses. F/u cultures. Liver abscesses are innumerable and cannot be drained - treat with IV abx - If patient does not have clinical improvement with IV abx and drain, he may need a Hartmann procedure, which I have discussed with him and his family.  FEN - NPO/IVFs VTE - ok for chemical prophylaxis  ID - Zosyn  AKI CAD with hx of MI HTN HLD OSA Obesity   I reviewed hospitalist notes, last 24 h vitals and pain scores, last 48 h intake and output, last 24 h labs and trends, and last 24 h imaging results.   LOS: 2 days    Dwan Bolt, [facility] 08/29/2021, 10:40 AM Please see Amion for pager number during day hours 7:00am-4:30pm or 7:00am -11:30am on weekends

## 2021-08-29 NOTE — Hospital Course (Addendum)
61 year old male with past medical history of CAD, HTN, HLD, obesity, OSA, prostate cancer presented to the hospital with complaints of fever and chills secondary to perforated sigmoid diverticulitis and liver abscess.  General surgery on board. Continues to have fever.  Low threshold to consult ID but for now holding as the patient is on adequate antibiotic coverage.

## 2021-08-29 NOTE — Progress Notes (Signed)
  Progress Note Patient: [name] [name] LGX:[id number] DOB: 23-Jun-1960 DOA: 08/27/2021  DOS: the patient was seen and examined on 08/29/2021  Brief hospital course: 61 year old male with past medical history of CAD, HTN, HLD, obesity, OSA, prostate cancer presented to the hospital with complaints of fever and chills secondary to perforated sigmoid diverticulitis and liver abscess.  General surgery on board. Assessment and Plan: Acute perforated sigmoid diverticulitis. Contained abscess. Continues to have some abdominal pain. Continues to have fever. Currently remains on clear liquid diet. Blood cultures so far negative.  Initially on cefepime and Flagyl but now on IV Zosyn. Monitor.  Liver lesions. MRI shows multiple liver lesions concerning for abscess. IR consulted for drain placed. Culture sent.  Ascending thoracic aorta. 4 cm in diameter. Annual outpatient patient follow-up.  Lower extremity edema. Dopplers negative for DVT. Monitor. Initiate diuresis once patient is able to tolerate p.o. diet.  Enterococcus UTI. Outpatient had 25-50,000 colonies of Enterococcus in the urine. Currently for empiric coverage I will add Zosyn.  Obesity. Placing the patient at high risk for poor outcome. Body mass index is 37.55 kg/m.   OSA. CPAP nightly.  Essential hypertension. Blood pressure soft. Currently holding all antihypertensive regimen.  Subjective: Continues to have abdominal pain.  No nausea no vomiting.  No fever no chills.  Passing gas.  Having 2 loose BM a day.  Physical Exam: Vitals:   08/29/21 0546 08/29/21 0811 08/29/21 1736 08/29/21 1836  BP: (!) 143/73 136/75 131/72   Pulse: 77 80 79   Resp: '20 19 18   '$ Temp: 98.4 F (36.9 C) 99.5 F (37.5 C) (!) 101.2 F (38.4 C) 100.1 F (37.8 C)  TempSrc: Oral Oral Oral Oral  SpO2: 94% 93% 99%   Weight:      Height:       General: Appear in mild distress; no visible Abnormal Neck Mass Or lumps, Conjunctiva  normal Cardiovascular: S1 and S2 Present, no Murmur, Respiratory: good respiratory effort, Bilateral Air entry present and CTA, no Crackles, no wheezes Abdomen: Bowel Sound present, diffusely tender Extremities: bilateral  Pedal edema Neurology: alert and oriented to time, place, and person  Gait not checked due to patient safety concerns   Data Reviewed: I have Reviewed nursing notes, Vitals, and Lab results since pt's last encounter. Pertinent lab results CBC and BMP I have ordered test including CBC and BMP   I have ordered lower extremity Dopplers.  Family Communication: At bedside  Disposition: Status is: Inpatient Remains inpatient appropriate because: Need for IV antibiotics, cultures, still febrile.  Author: Berle Mull, MD 08/29/2021 6:57 PM  Please look on [email or web address] to find out who is on call.

## 2021-08-29 NOTE — Plan of Care (Signed)
Has 1 episode of fever last night. Dressing changed.  Problem: Clinical Measurements: Goal: Will remain free from infection Outcome: Progressing   Problem: Elimination: Goal: Will not experience complications related to bowel motility Outcome: Progressing

## 2021-08-29 NOTE — Progress Notes (Signed)
Pharmacy Antibiotic Note  [name] [name] is a 61 y.o. male admitted on 08/27/2021 with UTI and intra-abdominal infection .  Pharmacy has been consulted for Zosyn dosing.  Plan: DC cefepime, metronidazole Start Zosyn 3.375g Q8h  Trend WBC, fever, renal function F/u cultures, clinical progress, levels as indicated De-escalate when able   Height: 5' 10.5" (179.1 cm) Weight: 120.4 kg (265 lb 6.9 oz) IBW/kg (Calculated) : 74.15  Temp (24hrs), Avg:99.6 F (37.6 C), Min:98.1 F (36.7 C), Max:101.2 F (38.4 C)  Recent Labs  Lab 08/27/21 1824 08/27/21 1945 08/27/21 2138 08/28/21 0300 08/28/21 0319 08/29/21 0236  WBC 15.9*  --  14.8* 15.0*  --  14.0*  CREATININE 1.45*  --  1.36* 1.25*  --  1.37*  LATICACIDVEN 1.8 1.7  --   --  1.7  --     Estimated Creatinine Clearance: 73.3 mL/min (A) (by C-G formula based on SCr of 1.37 mg/dL (H)).    No Known Allergies  Antimicrobials this admission: cefepime 7/10 >> 7/12 metronidazole 7/10 >> 7/11   Microbiology results: 7/10 BCx: ngtd (prelim) 7/11 abscess cx: NGRs (prelim) 7/10 UCx: ngtd (prelim)     Thank you for allowing pharmacy to be a part of this patient's care.  Ardyth Harps, PharmD Clinical Pharmacist

## 2021-08-30 ENCOUNTER — Inpatient Hospital Stay (HOSPITAL_COMMUNITY): Payer: BC Managed Care – PPO

## 2021-08-30 DIAGNOSIS — K5780 Diverticulitis of intestine, part unspecified, with perforation and abscess without bleeding: Secondary | ICD-10-CM | POA: Diagnosis not present

## 2021-08-30 LAB — COMPREHENSIVE METABOLIC PANEL
ALT: 54 U/L — ABNORMAL HIGH (ref 0–44)
AST: 45 U/L — ABNORMAL HIGH (ref 15–41)
Albumin: 2.1 g/dL — ABNORMAL LOW (ref 3.5–5.0)
Alkaline Phosphatase: 137 U/L — ABNORMAL HIGH (ref 38–126)
Anion gap: 11 (ref 5–15)
BUN: 18 mg/dL (ref 8–23)
CO2: 22 mmol/L (ref 22–32)
Calcium: 8.4 mg/dL — ABNORMAL LOW (ref 8.9–10.3)
Chloride: 102 mmol/L (ref 98–111)
Creatinine, Ser: 1.27 mg/dL — ABNORMAL HIGH (ref 0.61–1.24)
GFR, Estimated: >60 mL/min (ref >60)
Glucose, Bld: 158 mg/dL — ABNORMAL HIGH (ref 70–99)
Potassium: 4.3 mmol/L (ref 3.5–5.1)
Sodium: 135 mmol/L (ref 135–145)
Total Bilirubin: 1.3 mg/dL — ABNORMAL HIGH (ref 0.3–1.2)
Total Protein: 7.3 g/dL (ref 6.5–8.1)

## 2021-08-30 LAB — CBC WITH DIFFERENTIAL/PLATELET
Abs Immature Granulocytes: 0.14 10*3/uL — ABNORMAL HIGH (ref 0.00–0.07)
Basophils Absolute: 0.1 10*3/uL (ref 0.0–0.1)
Basophils Relative: 0 %
Eosinophils Absolute: 0.1 10*3/uL (ref 0.0–0.5)
Eosinophils Relative: 1 %
HCT: 33.7 % — ABNORMAL LOW (ref 39.0–52.0)
Hemoglobin: 11.3 g/dL — ABNORMAL LOW (ref 13.0–17.0)
Immature Granulocytes: 1 %
Lymphocytes Relative: 8 %
Lymphs Abs: 1.1 10*3/uL (ref 0.7–4.0)
MCH: 31 pg (ref 26.0–34.0)
MCHC: 33.5 g/dL (ref 30.0–36.0)
MCV: 92.6 fL (ref 80.0–100.0)
Monocytes Absolute: 0.9 10*3/uL (ref 0.1–1.0)
Monocytes Relative: 7 %
Neutro Abs: 11.3 10*3/uL — ABNORMAL HIGH (ref 1.7–7.7)
Neutrophils Relative %: 83 %
Platelets: 334 10*3/uL (ref 150–400)
RBC: 3.64 MIL/uL — ABNORMAL LOW (ref 4.22–5.81)
RDW: 13.8 % (ref 11.5–15.5)
WBC: 13.6 10*3/uL — ABNORMAL HIGH (ref 4.0–10.5)
nRBC: 0 % (ref 0.0–0.2)

## 2021-08-30 LAB — PROCALCITONIN: Procalcitonin: 1.13 ng/mL

## 2021-08-30 LAB — MAGNESIUM: Magnesium: 1.9 mg/dL (ref 1.7–2.4)

## 2021-08-30 MED ORDER — OXYCODONE HCL 5 MG PO TABS: 5.0000 mg | ORAL_TABLET | ORAL | Status: DC | PRN

## 2021-08-30 MED ORDER — SACCHAROMYCES BOULARDII 250 MG PO CAPS
250.0000 mg | ORAL_CAPSULE | Freq: Two times a day (BID) | ORAL | Status: DC
  Administered 2021-08-30 – 2021-09-05 (×14): 250 mg via ORAL
  Filled 2021-08-30 (×14): qty 1

## 2021-08-30 MED ORDER — BUTALBITAL-APAP-CAFFEINE 50-325-40 MG PO TABS
1.0000 | ORAL_TABLET | Freq: Four times a day (QID) | ORAL | Status: DC | PRN
  Administered 2021-08-30 – 2021-09-03 (×4): 1 via ORAL
  Filled 2021-08-30 (×4): qty 1

## 2021-08-30 MED ORDER — HYDROMORPHONE HCL 1 MG/ML IJ SOLN: 0.5000 mg | INTRAMUSCULAR | Status: DC | PRN

## 2021-08-30 NOTE — TOC CM/SW Note (Signed)
  Transition of Care [facility]) Screening Note   Patient Details  Name: MUKESH KORNEGAY Date of Birth: 07/29/1960      Transition of Care Department [facility]) has reviewed patient and no TOC needs have been identified at this time. We will continue to monitor patient advancement through interdisciplinary progression rounds. If new patient transition needs arise, please place a TOC consult.

## 2021-08-30 NOTE — Progress Notes (Signed)
Subjective: Febrile to 38.4 yesterday evening. Denies abdominal pain. Having diarrhea.  Objective: Vital signs in last 24 hours: Temp:  [98.5 F (36.9 C)-101.2 F (38.4 C)] 99.3 F (37.4 C) (07/13 0423) Pulse Rate:  [72-80] 74 (07/13 0423) Resp:  [16-20] 17 (07/13 0423) BP: (108-136)/(59-81) 131/81 (07/13 0423) SpO2:  [93 %-99 %] 93 % (07/13 0423) Last BM Date : 08/29/21  Intake/Output from previous day: 07/12 0701 - 07/13 0700 In: 245 [P.O.:240; I.V.:5] Out: 1245 [Urine:900; Drains:345] Intake/Output this shift: No intake/output data recorded.  PE: Gen: NAD, sitting up in a chair Heart: regular Lungs: respiratory effort nonlabored Abd: soft, mildly distended, nontender to palpation. Pelvic drain and RUQ drain with serosanguinous fluid.  Lab Results:  Recent Labs    08/29/21 0236 08/30/21 0101  WBC 14.0* 13.6*  HGB 11.1* 11.3*  HCT 32.7* 33.7*  PLT 320 334   BMET Recent Labs    08/29/21 0236 08/30/21 0101  NA 134* 135  K 4.1 4.3  CL 104 102  CO2 20* 22  GLUCOSE 201* 158*  BUN 21 18  CREATININE 1.37* 1.27*  CALCIUM 8.3* 8.4*   PT/INR Recent Labs    08/27/21 1824 08/28/21 0300  LABPROT 14.7 15.2  INR 1.2 1.2   CMP     Component Value Date/Time   NA 135 08/30/2021 0101   K 4.3 08/30/2021 0101   CL 102 08/30/2021 0101   CO2 22 08/30/2021 0101   GLUCOSE 158 (H) 08/30/2021 0101   BUN 18 08/30/2021 0101   CREATININE 1.27 (H) 08/30/2021 0101   CALCIUM 8.4 (L) 08/30/2021 0101   PROT 7.3 08/30/2021 0101   ALBUMIN 2.1 (L) 08/30/2021 0101   AST 45 (H) 08/30/2021 0101   ALT 54 (H) 08/30/2021 0101   ALKPHOS 137 (H) 08/30/2021 0101   BILITOT 1.3 (H) 08/30/2021 0101   GFRNONAA >60 08/30/2021 0101   GFRAA 55 (L) 10/30/2019 1747   Lipase  No results found for: "LIPASE"     Studies/Results: VAS [place] LOWER EXTREMITY VENOUS (DVT)  Result Date: 08/29/2021  Lower Venous DVT Study Patient Name:  [name] [name]  Date of Exam:   08/29/2021  Medical Rec #: [id number]          Accession #:    [id number] Date of Birth: 1960-05-09         Patient Gender: M Patient Age:   61 years Exam Location:  [facility] Procedure:      VAS [place] LOWER EXTREMITY VENOUS (DVT) Referring Phys: PRANAV PATEL --------------------------------------------------------------------------------  Indications: Edema.  Performing Technologist: Bobetta Lime BS, RVT  Examination Guidelines: A complete evaluation includes B-mode imaging, spectral Doppler, color Doppler, and power Doppler as needed of all accessible portions of each vessel. Bilateral testing is considered an integral part of a complete examination. Limited examinations for reoccurring indications may be performed as noted. The reflux portion of the exam is performed with the patient in reverse Trendelenburg.  +---------+---------------+---------+-----------+----------+--------------+ RIGHT    CompressibilityPhasicitySpontaneityPropertiesThrombus Aging +---------+---------------+---------+-----------+----------+--------------+ CFV      Full           Yes      Yes                                 +---------+---------------+---------+-----------+----------+--------------+ SFJ      Full                                                        +---------+---------------+---------+-----------+----------+--------------+  FV Prox  Full                                                        +---------+---------------+---------+-----------+----------+--------------+ FV Mid   Full                                                        +---------+---------------+---------+-----------+----------+--------------+ FV DistalFull                                                        +---------+---------------+---------+-----------+----------+--------------+ PFV      Full                                                         +---------+---------------+---------+-----------+----------+--------------+ POP      Full           Yes      Yes                                 +---------+---------------+---------+-----------+----------+--------------+ PTV      Full                                                        +---------+---------------+---------+-----------+----------+--------------+ PERO     Full                                                        +---------+---------------+---------+-----------+----------+--------------+   +---------+---------------+---------+-----------+----------+--------------+ LEFT     CompressibilityPhasicitySpontaneityPropertiesThrombus Aging +---------+---------------+---------+-----------+----------+--------------+ CFV      Full           Yes      Yes                                 +---------+---------------+---------+-----------+----------+--------------+ SFJ      Full                                                        +---------+---------------+---------+-----------+----------+--------------+ FV Prox  Full                                                        +---------+---------------+---------+-----------+----------+--------------+  FV Mid   Full                                                        +---------+---------------+---------+-----------+----------+--------------+ FV DistalFull                                                        +---------+---------------+---------+-----------+----------+--------------+ PFV      Full                                                        +---------+---------------+---------+-----------+----------+--------------+ POP      Full           Yes      Yes                                 +---------+---------------+---------+-----------+----------+--------------+ PTV      Full                                                         +---------+---------------+---------+-----------+----------+--------------+ PERO     Full                                                        +---------+---------------+---------+-----------+----------+--------------+     Summary: BILATERAL: - No evidence of deep vein thrombosis seen in the lower extremities, bilaterally. -No evidence of popliteal cyst, bilaterally.   *See table(s) above for measurements and observations. Electronically signed by Harold Barban MD on 08/29/2021 at 9:54:44 PM.    Final    CT IMAGE GUIDED DRAINAGE BY PERCUTANEOUS CATHETER  Result Date: 08/29/2021 INDICATION: 61 year old male referred for percutaneous drainage of liver abscess and pelvic abscess, diverticulitis EXAM: IMAGE GUIDED DRAINAGE OF LIVER ABSCESS IMAGE GUIDED DRAINAGE OF PELVIC ABSCESS TECHNIQUE: Multidetector CT imaging of the abdomen was performed following the standard protocol without IV contrast. RADIATION DOSE REDUCTION: This exam was performed according to the departmental dose-optimization program which includes automated exposure control, adjustment of the mA and/or kV according to patient size and/or use of iterative reconstruction technique. MEDICATIONS: The patient is currently admitted to the hospital and receiving intravenous antibiotics. The antibiotics were administered within an appropriate time frame prior to the initiation of the procedure. ANESTHESIA/SEDATION: Moderate (conscious) sedation was employed during this procedure. A total of Versed 3.0 mg and Fentanyl 150 mcg was administered intravenously by the radiology nurse. Total intra-service moderate Sedation Time: 37 minutes. The patient's level of consciousness and vital signs were monitored continuously by radiology nursing throughout the procedure under my direct supervision. COMPLICATIONS: None PROCEDURE: Informed written consent was obtained from the patient after  a thorough discussion of the procedural risks, benefits and alternatives.  All questions were addressed. Maximal Sterile Barrier Technique was utilized including caps, mask, sterile gowns, sterile gloves, sterile drape, hand hygiene and skin antiseptic. A timeout was performed prior to the initiation of the procedure. Patient positioned supine position on CT gantry table. Scout CT was acquired for planning purposes. Ultrasound images of the liver were acquired for planning purposes. The patient was then prepped and draped in the usual sterile fashion, in the low anterior pelvic region as well as the subcostal region in the upper abdomen. We initiated placement of the pelvic drain. Once we acquired appropriate trajectory for needle placement, 1% lidocaine was used for local anesthesia. Small stab incision was made with 11 blade scalpel a Yueh needle was advanced under CT guidance into the abscess anterior to the urinary bladder. Once we confirmed the catheter position modified Seldinger technique was then used to place a 10 [place] drain into the abscess. Aspiration of approximately 10 cc of frankly purulent material was performed. Catheter was sutured in position and attached to bulb suction. Final image was acquired. Sample was sent for analysis, from what we labeled as drain 1. Ultrasound guidance was then used to place a 12 [place] drain with trocar technique into the anterior located abscess within segment 3 of the liver the more cranial located abscess cavities were not as well visualized given the position under the diaphragm and behind the ribs. Approximately 15 cc of purulent material was aspirated with placement of the 12 French drain. Drain was sutured in position and attached to bulb suction. Final image was acquired. Patient tolerated the procedure well and remained hemodynamically stable throughout. No complications were encountered and no significant blood loss. IMPRESSION: Status post image guided placement of pelvic drain into super cystic abscess, as well as liver drain into  segment 3 liver abscess. Culture was sent from both sites. Signed, Dulcy Fanny. Nadene Rubins, RPVI [facility] Radiology Electronically Signed   By: Corrie Mckusick D.O.   On: 08/29/2021 09:53   CT IMAGE GUIDED DRAINAGE PERCUT CATH  PERITONEAL RETROPERIT  Result Date: 08/29/2021 INDICATION: 61 year old male referred for percutaneous drainage of liver abscess and pelvic abscess, diverticulitis EXAM: IMAGE GUIDED DRAINAGE OF LIVER ABSCESS IMAGE GUIDED DRAINAGE OF PELVIC ABSCESS TECHNIQUE: Multidetector CT imaging of the abdomen was performed following the standard protocol without IV contrast. RADIATION DOSE REDUCTION: This exam was performed according to the departmental dose-optimization program which includes automated exposure control, adjustment of the mA and/or kV according to patient size and/or use of iterative reconstruction technique. MEDICATIONS: The patient is currently admitted to the hospital and receiving intravenous antibiotics. The antibiotics were administered within an appropriate time frame prior to the initiation of the procedure. ANESTHESIA/SEDATION: Moderate (conscious) sedation was employed during this procedure. A total of Versed 3.0 mg and Fentanyl 150 mcg was administered intravenously by the radiology nurse. Total intra-service moderate Sedation Time: 37 minutes. The patient's level of consciousness and vital signs were monitored continuously by radiology nursing throughout the procedure under my direct supervision. COMPLICATIONS: None PROCEDURE: Informed written consent was obtained from the patient after a thorough discussion of the procedural risks, benefits and alternatives. All questions were addressed. Maximal Sterile Barrier Technique was utilized including caps, mask, sterile gowns, sterile gloves, sterile drape, hand hygiene and skin antiseptic. A timeout was performed prior to the initiation of the procedure. Patient positioned  supine position on CT gantry table. Scout CT was  acquired for planning purposes. Ultrasound images of the liver were acquired for planning purposes. The patient was then prepped and draped in the usual sterile fashion, in the low anterior pelvic region as well as the subcostal region in the upper abdomen. We initiated placement of the pelvic drain. Once we acquired appropriate trajectory for needle placement, 1% lidocaine was used for local anesthesia. Small stab incision was made with 11 blade scalpel a Yueh needle was advanced under CT guidance into the abscess anterior to the urinary bladder. Once we confirmed the catheter position modified Seldinger technique was then used to place a 10 [place] drain into the abscess. Aspiration of approximately 10 cc of frankly purulent material was performed. Catheter was sutured in position and attached to bulb suction. Final image was acquired. Sample was sent for analysis, from what we labeled as drain 1. Ultrasound guidance was then used to place a 12 [place] drain with trocar technique into the anterior located abscess within segment 3 of the liver the more cranial located abscess cavities were not as well visualized given the position under the diaphragm and behind the ribs. Approximately 15 cc of purulent material was aspirated with placement of the 12 French drain. Drain was sutured in position and attached to bulb suction. Final image was acquired. Patient tolerated the procedure well and remained hemodynamically stable throughout. No complications were encountered and no significant blood loss. IMPRESSION: Status post image guided placement of pelvic drain into super cystic abscess, as well as liver drain into segment 3 liver abscess. Culture was sent from both sites. Signed, Dulcy Fanny. Nadene Rubins, RPVI [facility] Radiology Electronically Signed   By: Corrie Mckusick D.O.   On: 08/29/2021 09:53   CT ABDOMEN PELVIS W  CONTRAST  Result Date: 08/28/2021 CLINICAL DATA:  Hepatic lesions on recent MRI. EXAM: CT ABDOMEN AND PELVIS WITH CONTRAST TECHNIQUE: Multidetector CT imaging of the abdomen and pelvis was performed using the standard protocol following bolus administration of intravenous contrast. RADIATION DOSE REDUCTION: This exam was performed according to the departmental dose-optimization program which includes automated exposure control, adjustment of the mA and/or kV according to patient size and/or use of iterative reconstruction technique. CONTRAST:  141m OMNIPAQUE IOHEXOL 350 MG/ML SOLN COMPARISON:  MRI 08/28/2021 FINDINGS: Lower chest: Streaky areas of subsegmental atelectasis but no pleural effusions or pulmonary infiltrates. The heart is within normal limits in size. No pericardial effusion. Age advanced coronary artery calcifications are noted. Hepatobiliary: Numerous rim enhancing low-attenuation lesions throughout the liver most likely reflecting hepatic abscesses. No intrahepatic biliary dilatation. The portal and hepatic veins are patent. No common bile duct dilatation. Pancreas: No mass, inflammation or ductal dilatation. Spleen: Moderate splenomegaly. The spleen measures 16.5 x 13.4 x 11 cm. No splenic lesions. The splenic vein is patent. Adrenals/Urinary Tract: The adrenal glands are normal. Small renal calculi but no renal lesions or hydronephrosis. High attenuation material in the bladder could be related to a prior CT scan with contrast. Stomach/Bowel: The stomach, duodenum and small are unremarkable. Terminal ileum and appendix are normal. Distal descending and upper sigmoid colon diverticulitis with a large complex diverticular abscess or which is along the anterior aspect of the bladder and could potentially involve the bladder wall. This abscess measures approximately 6.1 x 4.8 x 4.3 cm. The remainder of the colon demonstrates diverticulosis but no mass or other areas of diverticulitis.  Vascular/Lymphatic: Age advanced atherosclerotic calcification involving the aorta and branch vessels but no aneurysm or dissection.  Numerous small mesenteric and retroperitoneal lymph nodes are likely reactive. Reproductive: Status post prostatectomy. Other: Mild mesenteric and retroperitoneal edema along with a small amount of free fluid mainly in the pelvis. Musculoskeletal: Bilateral pars defects at L5 with a grade 1 spondylolisthesis and advanced degenerative disc disease at L5-S1. No acute bony findings. IMPRESSION: 1. Numerous rim enhancing low-attenuation lesions throughout the liver most likely reflecting hepatic abscesses. 2. Distal descending colon and upper sigmoid colon diverticulitis and large (6.1 x 4.8 x 4.3 cm) complex diverticular abscess along the anterior aspect of the bladder. 3. Moderate splenomegaly. 4. Age advanced atherosclerotic calcification involving the aorta and branch vessels and coronary arteries. 5. Status post prostatectomy. Aortic Atherosclerosis (ICD10-I70.0). Electronically Signed   By: Marijo Sanes M.D.   On: 08/28/2021 10:26   CT CHEST WO CONTRAST  Result Date: 08/28/2021 CLINICAL DATA:  Abnormal xray - lung opacity/opacities Liver masses, suspected malignancy. EXAM: CT CHEST WITHOUT CONTRAST TECHNIQUE: Multidetector CT imaging of the chest was performed following the standard protocol without IV contrast. RADIATION DOSE REDUCTION: This exam was performed according to the departmental dose-optimization program which includes automated exposure control, adjustment of the mA and/or kV according to patient size and/or use of iterative reconstruction technique. COMPARISON:  Correlation is made with a chest x-ray from yesterday and MRI of the abdomen performed on the same day earlier FINDINGS: Cardiovascular: Ascending thoracic aorta measures 4 cm in greatest dimension. Moderate atheromatous calcifications of the coronary arteries. Main pulmonary artery measures 3.2 cm and is  dilated. Normal heart size. No pericardial effusion. Mediastinum/Nodes: No significantly enlarged mediastinal or axillary lymph nodes. Thyroid gland, trachea, and esophagus demonstrate no significant findings. Lungs/Pleura: No focal consolidation, pulmonary mass or discrete nodule seen. Mild bibasilar pleural pulmonary scarring. No pleural effusion or pneumothorax. Upper Abdomen: Again seen are several hypodense lesions in the liver and some of the lesions appear conglomerated. Spleen, pancreas, adrenals have a normal appearance. Musculoskeletal: Mild-to-moderate thoracolumbar spondylosis with prominent anterior marginal osteophytes/syndesmophytes. IMPRESSION: 1. Ascending thoracic aorta measures 4 cm in greatest dimension. Pulmonary artery measures 3.2 cm and may reflect pulmonary hypertension. Recommend annual imaging followup by CTA or MRA. This recommendation follows 2010 ACCF/AHA/AATS/ACR/ASA/SCA/SCAI/SIR/STS/SVM Guidelines for the Diagnosis and Management of Patients with Thoracic Aortic Disease. Circulation. 2010; 121: O242-P536. Aortic aneurysm NOS (ICD10-I71.9) 2. No pulmonary mass or discrete nodule seen. No significant mediastinal/hilar lymphadenopathy. Mild bibasilar pleural pulmonary scarring. 3. There are several hypodense lesions in the liver with a differential considerations of hepatic abscesses versus cystic/necrotic metastasis (please refer to the MRI of the abdomen for detailed description). Electronically Signed   By: Frazier Richards M.D.   On: 08/28/2021 08:37    Anti-infectives: Anti-infectives (From admission, onward)    Start     Dose/Rate Route Frequency Ordered Stop   08/29/21 1200  piperacillin-tazobactam (ZOSYN) IVPB 3.375 g        3.375 g 12.5 mL/hr over 240 Minutes Intravenous Every 8 hours 08/29/21 0928     08/28/21 0400  ceFEPIme (MAXIPIME) 2 g in sodium chloride 0.9 % 100 mL IVPB  Status:  Discontinued        2 g 200 mL/hr over 30 Minutes Intravenous Every 8 hours 08/27/21  2103 08/29/21 0844   08/27/21 2145  metroNIDAZOLE (FLAGYL) IVPB 500 mg  Status:  Discontinued        500 mg 100 mL/hr over 60 Minutes Intravenous Every 12 hours 08/27/21 2142 08/29/21 0844   08/27/21 1730  ceFEPIme (MAXIPIME) 2 g in sodium chloride 0.9 %  100 mL IVPB        2 g 200 mL/hr over 30 Minutes Intravenous  Once 08/27/21 1721 08/27/21 1948   08/27/21 1730  metroNIDAZOLE (FLAGYL) IVPB 500 mg        500 mg 100 mL/hr over 60 Minutes Intravenous  Once 08/27/21 1721 08/27/21 2110        Assessment/Plan Diverticulitis with abscess and liver lesions abscess vs necrotic tumor. 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 - Ok for clear liquids - Continue Zosyn - S/p percutaneous drainage of pelvic abscess and one of the liver abscesses. F/u cultures, speciation pending. - Very minimal output from hepatic drain, will discuss removal with IR. Keep pelvic drain in place. - Difficult to determine if ongoing fevers are coming from liver abscesses or diverticulitis. WBC remains elevated at 13. Continue IV antibiotics and nonoperative management for now. Patient may ultimately require sigmoid resection this admission if he does not clinically improve.  I reviewed hospitalist notes, last 24 h vitals and pain scores, last 48 h intake and output, last 24 h labs and trends, and last 24 h imaging results.   LOS: 3 days    Dwan Bolt, [facility] 08/30/2021, 7:48 AM Please see Amion for pager number during day hours 7:00am-4:30pm or 7:00am -11:30am on weekends

## 2021-08-30 NOTE — Progress Notes (Signed)
Pt placed on CPAP w/ bubble bottle for humidity w/ air. RT will cont to monitor as needed.

## 2021-08-30 NOTE — Progress Notes (Signed)
  Progress Note Patient: [name] [name] TDH:[id number] DOB: 1960/09/23 DOA: 08/27/2021  DOS: the patient was seen and examined on 08/30/2021  Brief hospital course: 61 year old male with past medical history of CAD, HTN, HLD, obesity, OSA, prostate cancer presented to the hospital with complaints of fever and chills secondary to perforated sigmoid diverticulitis and liver abscess.  General surgery on board. Assessment and Plan: Acute perforated sigmoid diverticulitis with large complex diverticular abscess Presented with fever of unknown origin.  Work-up showed diverticulitis.  CT scan shows 6.1 x 4.8 x 4.3 cm diverticular abscess. General surgery was consulted. IR was consulted. Patient has 2 drain placed so far.  Output from the hepatic drain is actually getting slower. Initially was on IV cefepime and Flagyl and now on IV Zosyn. Follow-up on blood cultures so far no growth. Drain culture is growing gram-negative rods sensitivities and identification currently pending.   Liver lesions. MRI shows multiple liver lesions concerning for abscess. IR consulted for drain placed. Culture sent.   Aneurysm of ascending thoracic aorta. 4 cm in diameter. Annual outpatient patient follow-up.  Lower extremity edema. Dopplers negative for DVT. Initiate diuresis once patient is able to tolerate p.o. diet. Monitor  Enterococcus UTI. Outpatient had 25-50,000 colonies of Enterococcus in the urine. Currently for empiric coverage I will add Zosyn.  Obesity. Placing the patient at high risk for poor outcome. Body mass index is 37.55 kg/m.    OSA. CPAP nightly. Patient was unable to tolerate the CPAP provided from the hospital due to dry mouth. Recommend to add humidity.  Essential hypertension. Blood pressure soft. Currently holding all antihypertensive regimen.  Acute headache. CT of the head unremarkable for any acute abnormality. No focal deficit on exam. Treated with  Tylenol/Fioricet/narcotics.  Subjective: Primary complaint right now is actually headache.  Abdominal pain is improving.  No nausea no vomiting.  Tolerating oral diet.  Continues to have 2 bowel movements a day.  Physical Exam: Vitals:   08/30/21 0212 08/30/21 0423 08/30/21 0756 08/30/21 1539  BP: 132/77 131/81 135/84 129/69  Pulse: 79 74 78 78  Resp: '20 17 18 17  '$ Temp: 99.9 F (37.7 C) 99.3 F (37.4 C) 100.2 F (37.9 C) 100 F (37.8 C)  TempSrc: Oral Oral Oral Oral  SpO2: 97% 93% 97% 98%  Weight:      Height:       General: Appear in moderate distress; no visible Abnormal Neck Mass Or lumps, Conjunctiva normal Cardiovascular: S1 and S2 Present, no Murmur, Respiratory: good respiratory effort, Bilateral Air entry present and CTA, no Crackles, no wheezes Abdomen: Bowel Sound present, mild tenderness. Extremities: bilateral Pedal edema Neurology: alert and oriented to time, place, and person  Gait not checked due to patient safety concerns   Data Reviewed: I have Reviewed nursing notes, Vitals, and Lab results since pt's last encounter. Pertinent lab results CBC and BMP I have ordered test including CBC and BMP I have ordered imaging studies CT head. I have reviewed the last note from general surgery,    Family Communication: Wife at bedside  Disposition: Status is: Inpatient Remains inpatient appropriate because: Need for IV antibiotics pending cultures.  Author: Berle Mull, MD 08/30/2021 6:53 PM  Please look on [email or web address] to find out who is on call.

## 2021-08-30 NOTE — Progress Notes (Signed)
Mobility Specialist Progress Note:   08/30/21 1500  Mobility  Activity Ambulated with assistance in hallway  Level of Assistance Independent  Assistive Device None  Distance Ambulated (ft) 550 ft  Activity Response Tolerated well  $Mobility charge 1 Mobility   Pt eager for mobility session. Required no physical assistance. No c/o pain throughout. Pt back in bed with all needs met.   [facility] Secure Chat or Office Phone: [phone]  

## 2021-08-31 ENCOUNTER — Inpatient Hospital Stay (HOSPITAL_COMMUNITY): Payer: BC Managed Care – PPO

## 2021-08-31 DIAGNOSIS — K5780 Diverticulitis of intestine, part unspecified, with perforation and abscess without bleeding: Secondary | ICD-10-CM | POA: Diagnosis not present

## 2021-08-31 LAB — CBC WITH DIFFERENTIAL/PLATELET
Abs Immature Granulocytes: 0.14 10*3/uL — ABNORMAL HIGH (ref 0.00–0.07)
Basophils Absolute: 0.1 10*3/uL (ref 0.0–0.1)
Basophils Relative: 0 %
Eosinophils Absolute: 0.1 10*3/uL (ref 0.0–0.5)
Eosinophils Relative: 0 %
HCT: 33.2 % — ABNORMAL LOW (ref 39.0–52.0)
Hemoglobin: 11.1 g/dL — ABNORMAL LOW (ref 13.0–17.0)
Immature Granulocytes: 1 %
Lymphocytes Relative: 11 %
Lymphs Abs: 1.4 10*3/uL (ref 0.7–4.0)
MCH: 31.3 pg (ref 26.0–34.0)
MCHC: 33.4 g/dL (ref 30.0–36.0)
MCV: 93.5 fL (ref 80.0–100.0)
Monocytes Absolute: 1 10*3/uL (ref 0.1–1.0)
Monocytes Relative: 8 %
Neutro Abs: 10.1 10*3/uL — ABNORMAL HIGH (ref 1.7–7.7)
Neutrophils Relative %: 80 %
Platelets: 325 10*3/uL (ref 150–400)
RBC: 3.55 MIL/uL — ABNORMAL LOW (ref 4.22–5.81)
RDW: 13.6 % (ref 11.5–15.5)
WBC: 12.7 10*3/uL — ABNORMAL HIGH (ref 4.0–10.5)
nRBC: 0 % (ref 0.0–0.2)

## 2021-08-31 LAB — COMPREHENSIVE METABOLIC PANEL
ALT: 42 U/L (ref 0–44)
AST: 33 U/L (ref 15–41)
Albumin: 2 g/dL — ABNORMAL LOW (ref 3.5–5.0)
Alkaline Phosphatase: 123 U/L (ref 38–126)
Anion gap: 10 (ref 5–15)
BUN: 15 mg/dL (ref 8–23)
CO2: 22 mmol/L (ref 22–32)
Calcium: 8 mg/dL — ABNORMAL LOW (ref 8.9–10.3)
Chloride: 101 mmol/L (ref 98–111)
Creatinine, Ser: 1.63 mg/dL — ABNORMAL HIGH (ref 0.61–1.24)
GFR, Estimated: 47 mL/min — ABNORMAL LOW (ref >60)
Glucose, Bld: 136 mg/dL — ABNORMAL HIGH (ref 70–99)
Potassium: 4 mmol/L (ref 3.5–5.1)
Sodium: 133 mmol/L — ABNORMAL LOW (ref 135–145)
Total Bilirubin: 0.9 mg/dL (ref 0.3–1.2)
Total Protein: 7.2 g/dL (ref 6.5–8.1)

## 2021-08-31 LAB — MAGNESIUM: Magnesium: 2 mg/dL (ref 1.7–2.4)

## 2021-08-31 LAB — PROCALCITONIN: Procalcitonin: 0.97 ng/mL

## 2021-08-31 MED ORDER — DM-GUAIFENESIN ER 30-600 MG PO TB12
1.0000 | ORAL_TABLET | Freq: Two times a day (BID) | ORAL | Status: DC
  Administered 2021-08-31 – 2021-09-05 (×12): 1 via ORAL
  Filled 2021-08-31 (×12): qty 1

## 2021-08-31 MED ORDER — ALBUMIN HUMAN 5 % IV SOLN
12.5000 g | Freq: Once | INTRAVENOUS | Status: AC
  Administered 2021-08-31: 12.5 g via INTRAVENOUS
  Filled 2021-08-31: qty 250

## 2021-08-31 MED ORDER — FUROSEMIDE 40 MG PO TABS
40.0000 mg | ORAL_TABLET | Freq: Every day | ORAL | Status: DC
  Administered 2021-09-01 – 2021-09-04 (×4): 40 mg via ORAL
  Filled 2021-08-31 (×4): qty 1

## 2021-08-31 MED ORDER — FLUTICASONE PROPIONATE 50 MCG/ACT NA SUSP
2.0000 | Freq: Every day | NASAL | Status: DC
  Administered 2021-08-31 – 2021-09-05 (×6): 2 via NASAL
  Filled 2021-08-31: qty 16

## 2021-08-31 MED ORDER — SIMETHICONE 40 MG/0.6ML PO SUSP
40.0000 mg | Freq: Four times a day (QID) | ORAL | Status: DC | PRN
  Administered 2021-08-31 – 2021-09-01 (×2): 40 mg via ORAL
  Filled 2021-08-31 (×3): qty 0.6

## 2021-08-31 MED ORDER — BENZONATATE 100 MG PO CAPS
100.0000 mg | ORAL_CAPSULE | Freq: Three times a day (TID) | ORAL | Status: DC
  Administered 2021-08-31 – 2021-09-05 (×18): 100 mg via ORAL
  Filled 2021-08-31 (×18): qty 1

## 2021-08-31 NOTE — Progress Notes (Signed)
Subjective: Low grade fever to 38.2 overnight. Had migraine overnight, resolved this morning. Reports occasional gas pain but otherwise denies abdominal pain. Subjective says he is feeling better today. WBC continues to slowly downtrend.  Objective: Vital signs in last 24 hours: Temp:  [98.6 F (37 C)-100.8 F (38.2 C)] 98.6 F (37 C) (07/14 0758) Pulse Rate:  [65-81] 66 (07/14 0758) Resp:  [17-20] 17 (07/14 0758) BP: (121-137)/(69-84) 137/84 (07/14 0758) SpO2:  [95 %-98 %] 97 % (07/14 0451) Last BM Date : 08/30/21  Intake/Output from previous day: 07/13 0701 - 07/14 0700 In: 740 [P.O.:730] Out: 170 [Drains:170] Intake/Output this shift: No intake/output data recorded.  PE: Gen: NAD, sitting up in a chair Heart: regular Lungs: respiratory effort nonlabored Abd: soft, mildly distended, nontender to palpation. Pelvic drain is now bilious, RUQ drain scant serosanguinous fluid.  Lab Results:  Recent Labs    08/30/21 0101 08/31/21 0113  WBC 13.6* 12.7*  HGB 11.3* 11.1*  HCT 33.7* 33.2*  PLT 334 325   BMET Recent Labs    08/30/21 0101 08/31/21 0113  NA 135 133*  K 4.3 4.0  CL 102 101  CO2 22 22  GLUCOSE 158* 136*  BUN 18 15  CREATININE 1.27* 1.63*  CALCIUM 8.4* 8.0*   PT/INR No results for input(s): "LABPROT", "INR" in the last 72 hours.  CMP     Component Value Date/Time   NA 133 (L) 08/31/2021 0113   K 4.0 08/31/2021 0113   CL 101 08/31/2021 0113   CO2 22 08/31/2021 0113   GLUCOSE 136 (H) 08/31/2021 0113   BUN 15 08/31/2021 0113   CREATININE 1.63 (H) 08/31/2021 0113   CALCIUM 8.0 (L) 08/31/2021 0113   PROT 7.2 08/31/2021 0113   ALBUMIN 2.0 (L) 08/31/2021 0113   AST 33 08/31/2021 0113   ALT 42 08/31/2021 0113   ALKPHOS 123 08/31/2021 0113   BILITOT 0.9 08/31/2021 0113   GFRNONAA 47 (L) 08/31/2021 0113   GFRAA 55 (L) 10/30/2019 1747   Lipase  No results found for: "LIPASE"     Studies/Results: CT HEAD WO CONTRAST (5MM)  Result  Date: 08/30/2021 CLINICAL DATA:  Headache EXAM: CT HEAD WITHOUT CONTRAST TECHNIQUE: Contiguous axial images were obtained from the base of the skull through the vertex without intravenous contrast. RADIATION DOSE REDUCTION: This exam was performed according to the departmental dose-optimization program which includes automated exposure control, adjustment of the mA and/or kV according to patient size and/or use of iterative reconstruction technique. COMPARISON:  None Available. FINDINGS: Brain: No evidence of acute infarction, hemorrhage, hydrocephalus, extra-axial collection or mass lesion/mass effect. Vascular: No hyperdense vessel or unexpected calcification. Skull: Normal. Negative for fracture or focal lesion. Sinuses/Orbits: No acute finding. Other: None. IMPRESSION: No acute intracranial abnormality. Electronically Signed   By: Yetta Glassman M.D.   On: 08/30/2021 14:24   VAS [place] LOWER EXTREMITY VENOUS (DVT)  Result Date: 08/29/2021  Lower Venous DVT Study Patient Name:  ADRYAN SHIN  Date of Exam:   08/29/2021 Medical Rec #: [id number]          Accession #:    [id number] Date of Birth: 04-Nov-1960         Patient Gender: M Patient Age:   61 years Exam Location:  [facility] Procedure:      VAS [place] LOWER EXTREMITY VENOUS (DVT) Referring Phys: PRANAV PATEL --------------------------------------------------------------------------------  Indications: Edema.  Performing Technologist: Bobetta Lime BS, RVT  Examination Guidelines: A complete  evaluation includes B-mode imaging, spectral Doppler, color Doppler, and power Doppler as needed of all accessible portions of each vessel. Bilateral testing is considered an integral part of a complete examination. Limited examinations for reoccurring indications may be performed as noted. The reflux portion of the exam is performed with the patient in reverse Trendelenburg.  +---------+---------------+---------+-----------+----------+--------------+ RIGHT     CompressibilityPhasicitySpontaneityPropertiesThrombus Aging +---------+---------------+---------+-----------+----------+--------------+ CFV      Full           Yes      Yes                                 +---------+---------------+---------+-----------+----------+--------------+ SFJ      Full                                                        +---------+---------------+---------+-----------+----------+--------------+ FV Prox  Full                                                        +---------+---------------+---------+-----------+----------+--------------+ FV Mid   Full                                                        +---------+---------------+---------+-----------+----------+--------------+ FV DistalFull                                                        +---------+---------------+---------+-----------+----------+--------------+ PFV      Full                                                        +---------+---------------+---------+-----------+----------+--------------+ POP      Full           Yes      Yes                                 +---------+---------------+---------+-----------+----------+--------------+ PTV      Full                                                        +---------+---------------+---------+-----------+----------+--------------+ PERO     Full                                                        +---------+---------------+---------+-----------+----------+--------------+   +---------+---------------+---------+-----------+----------+--------------+  LEFT     CompressibilityPhasicitySpontaneityPropertiesThrombus Aging +---------+---------------+---------+-----------+----------+--------------+ CFV      Full           Yes      Yes                                 +---------+---------------+---------+-----------+----------+--------------+ SFJ      Full                                                         +---------+---------------+---------+-----------+----------+--------------+ FV Prox  Full                                                        +---------+---------------+---------+-----------+----------+--------------+ FV Mid   Full                                                        +---------+---------------+---------+-----------+----------+--------------+ FV DistalFull                                                        +---------+---------------+---------+-----------+----------+--------------+ PFV      Full                                                        +---------+---------------+---------+-----------+----------+--------------+ POP      Full           Yes      Yes                                 +---------+---------------+---------+-----------+----------+--------------+ PTV      Full                                                        +---------+---------------+---------+-----------+----------+--------------+ PERO     Full                                                        +---------+---------------+---------+-----------+----------+--------------+     Summary: BILATERAL: - No evidence of deep vein thrombosis seen in the lower extremities, bilaterally. -No evidence of popliteal cyst, bilaterally.   *See table(s) above for measurements and observations. Electronically signed by Harold Barban MD on 08/29/2021 at 9:54:44 PM.  Final     Anti-infectives: Anti-infectives (From admission, onward)    Start     Dose/Rate Route Frequency Ordered Stop   08/29/21 1200  piperacillin-tazobactam (ZOSYN) IVPB 3.375 g        3.375 g 12.5 mL/hr over 240 Minutes Intravenous Every 8 hours 08/29/21 0928     08/28/21 0400  ceFEPIme (MAXIPIME) 2 g in sodium chloride 0.9 % 100 mL IVPB  Status:  Discontinued        2 g 200 mL/hr over 30 Minutes Intravenous Every 8 hours 08/27/21 2103 08/29/21 0844   08/27/21 2145  metroNIDAZOLE (FLAGYL)  IVPB 500 mg  Status:  Discontinued        500 mg 100 mL/hr over 60 Minutes Intravenous Every 12 hours 08/27/21 2142 08/29/21 0844   08/27/21 1730  ceFEPIme (MAXIPIME) 2 g in sodium chloride 0.9 % 100 mL IVPB        2 g 200 mL/hr over 30 Minutes Intravenous  Once 08/27/21 1721 08/27/21 1948   08/27/21 1730  metroNIDAZOLE (FLAGYL) IVPB 500 mg        500 mg 100 mL/hr over 60 Minutes Intravenous  Once 08/27/21 1721 08/27/21 2110        Assessment/Plan Diverticulitis with abscess and liver lesions abscess vs necrotic tumor. 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 - Ok for clear liquids, do not advance further given ongoing fevers - Continue Zosyn - S/p percutaneous drainage of pelvic abscess and one of the liver abscesses. Liver cultures are negative to date and output very scant, will ask IR if this can be removed. - Pelvic drain now has enteric contents with significant output. - Difficult to determine if ongoing fevers are coming from liver abscesses or diverticulitis. WBC is slowly downtrending and patient is not having any abdominal pain. Continue Zosyn. It has only been 3 days since most recent imaging. If fevers persist, will plan for repeat CT this Sunday. If patient does not make significant clinical improvement, he may ultimately require surgery next week, which I discussed with him.  I reviewed hospitalist notes, last 24 h vitals and pain scores, last 48 h intake and output, last 24 h labs and trends, and last 24 h imaging results.   LOS: 4 days    Dwan Bolt, [facility] 08/31/2021, 8:41 AM Please see Amion for pager number during day hours 7:00am-4:30pm or 7:00am -11:30am on weekends

## 2021-08-31 NOTE — Progress Notes (Signed)
Pt c/o of LLQ pain ( abdominal pain), stated a sharp pain that dose not last for long ( comes and go).When I asked him to rate the pain, pt stated 4/10.  Pt said he thinks  the pain may be from gas because he has been passing a lot of gas. On call provider notified who prescribed PRN for flatulence. Pt also had temp of 100.8 F, Tylenol 500 mg was given, rechecked pt Temp and and it was  100.4 F. On call notified and aware.

## 2021-08-31 NOTE — Progress Notes (Signed)
Progress Note Patient: [name] [name] JWJ:[id number] DOB: 12/02/1960 DOA: 08/27/2021  DOS: the patient was seen and examined on 08/31/2021  Brief hospital course: 61 year old male with past medical history of CAD, HTN, HLD, obesity, OSA, prostate cancer presented to the hospital with complaints of fever and chills secondary to perforated sigmoid diverticulitis and liver abscess.  General surgery on board. Continues to have fever.  Low threshold to consult ID but for now holding as the patient is on adequate antibiotic coverage.  Assessment and Plan: Acute perforated sigmoid diverticulitis with large complex diverticular abscess Presented with fever of unknown origin.  Work-up showed diverticulitis with CT scan shows 6.1 x 4.8 x 4.3 cm diverticular abscess. General surgery was consulted. IR was consulted. Patient has 2 drain placed so far.  Output from the hepatic drain is actually getting slower. Initially was on IV cefepime and Flagyl and now on IV Zosyn to cover for Enterococcus that he grew earlier outpatient. While the blood cultures so far continues to show no growth patient continues to spike fever. Cultures from the drain are growing pansensitive E. coli. There is also an anaerobe. Patient is covered adequately with Zosyn for anaerobic for now and therefore holding off on broadening the coverage. If continues to spike fever ideally would like to consult ID.   Liver lesions. MRI shows multiple liver lesions concerning for abscess. IR consulted for drain placed. Culture sent.   Aneurysm of ascending thoracic aorta. 4 cm in diameter. Annual outpatient patient follow-up.  Lower extremity edema. Dopplers negative for DVT. Initiate diuresis once patient is able to tolerate p.o. diet. Monitor  Enterococcus UTI. Outpatient had 25-50,000 colonies of Enterococcus in the urine. Currently for empiric coverage I will add Zosyn.  Obesity. Placing the patient at high risk for poor  outcome. Body mass index is 37.86 kg/m.    OSA. CPAP nightly. Patient was unable to tolerate the CPAP provided from the hospital due to dry mouth. Recommend to add humidity.   Essential hypertension. Blood pressure soft. Currently holding all antihypertensive regimen.   Acute headache. CT of the head unremarkable for any acute abnormality. No focal deficit on exam. Treated with Tylenol/Fioricet/narcotics. Resolved with Fioricet.  Cough and shortness of breath. Etiology not clear. Possibility of GERD. Patient is on IV PPI. Possibility of sinusitis.  Patient is on IV Zosyn and will add Mucinex, Tessalon Perles and Flonase.  Chest x-ray negative for any acute abnormality.  Bilateral pedal edema. We will initiate Lasix tomorrow.  Subjective: No acute complaint in the morning.  No nausea no vomiting but no fever no chills.  Had some more cough.  Continues to have some shortness of breath.  Runny nose reported as well.  Physical Exam: Vitals:   08/31/21 0451 08/31/21 0758 08/31/21 1403 08/31/21 1519  BP: 124/73 137/84  131/73  Pulse: 65 66  77  Resp: '18 17  18  '$ Temp: 98.6 F (37 C) 98.6 F (37 C) (!) 102.8 F (39.3 C) 99.7 F (37.6 C)  TempSrc: Oral Oral Oral Oral  SpO2: 97%   97%  Weight:  121.4 kg    Height:       General: Appear in mild distress; no visible Abnormal Neck Mass Or lumps, Conjunctiva normal Cardiovascular: S1 and S2 Present, no Murmur, Respiratory: good respiratory effort, Bilateral Air entry present and CTA, no Crackles, no wheezes Abdomen: Bowel Sound present, diffusely tender mild Extremities: bilateral Pedal edema Neurology: alert and oriented to time, place, and person  Gait not checked  due to patient safety concerns   Data Reviewed: I have Reviewed nursing notes, Vitals, and Lab results since pt's last encounter. Pertinent lab results CBC and CMP I have ordered test including CBC and BMP I have discussed pt's care plan and test results with  general surgery.   Family Communication: Wife at bedside  Disposition: Status is: Inpatient Remains inpatient appropriate because: Need for IV antibiotics with ongoing fever  Author: Berle Mull, MD 08/31/2021 6:00 PM  Please look on [email or web address] to find out who is on call.

## 2021-09-01 DIAGNOSIS — E785 Hyperlipidemia, unspecified: Secondary | ICD-10-CM

## 2021-09-01 DIAGNOSIS — K5780 Diverticulitis of intestine, part unspecified, with perforation and abscess without bleeding: Secondary | ICD-10-CM | POA: Diagnosis not present

## 2021-09-01 LAB — CBC WITH DIFFERENTIAL/PLATELET
Abs Immature Granulocytes: 0.11 10*3/uL — ABNORMAL HIGH (ref 0.00–0.07)
Basophils Absolute: 0 10*3/uL (ref 0.0–0.1)
Basophils Relative: 0 %
Eosinophils Absolute: 0.1 10*3/uL (ref 0.0–0.5)
Eosinophils Relative: 0 %
HCT: 32.7 % — ABNORMAL LOW (ref 39.0–52.0)
Hemoglobin: 11 g/dL — ABNORMAL LOW (ref 13.0–17.0)
Immature Granulocytes: 1 %
Lymphocytes Relative: 10 %
Lymphs Abs: 1.1 10*3/uL (ref 0.7–4.0)
MCH: 31.3 pg (ref 26.0–34.0)
MCHC: 33.6 g/dL (ref 30.0–36.0)
MCV: 92.9 fL (ref 80.0–100.0)
Monocytes Absolute: 0.7 10*3/uL (ref 0.1–1.0)
Monocytes Relative: 6 %
Neutro Abs: 9.9 10*3/uL — ABNORMAL HIGH (ref 1.7–7.7)
Neutrophils Relative %: 83 %
Platelets: 314 10*3/uL (ref 150–400)
RBC: 3.52 MIL/uL — ABNORMAL LOW (ref 4.22–5.81)
RDW: 13.7 % (ref 11.5–15.5)
WBC: 11.9 10*3/uL — ABNORMAL HIGH (ref 4.0–10.5)
nRBC: 0 % (ref 0.0–0.2)

## 2021-09-01 LAB — CULTURE, BLOOD (ROUTINE X 2)
Culture: NO GROWTH
Culture: NO GROWTH

## 2021-09-01 LAB — COMPREHENSIVE METABOLIC PANEL
ALT: 41 U/L (ref 0–44)
AST: 41 U/L (ref 15–41)
Albumin: 2.1 g/dL — ABNORMAL LOW (ref 3.5–5.0)
Alkaline Phosphatase: 110 U/L (ref 38–126)
Anion gap: 11 (ref 5–15)
BUN: 12 mg/dL (ref 8–23)
CO2: 21 mmol/L — ABNORMAL LOW (ref 22–32)
Calcium: 8.2 mg/dL — ABNORMAL LOW (ref 8.9–10.3)
Chloride: 101 mmol/L (ref 98–111)
Creatinine, Ser: 1.21 mg/dL (ref 0.61–1.24)
GFR, Estimated: >60 mL/min (ref >60)
Glucose, Bld: 139 mg/dL — ABNORMAL HIGH (ref 70–99)
Potassium: 3.8 mmol/L (ref 3.5–5.1)
Sodium: 133 mmol/L — ABNORMAL LOW (ref 135–145)
Total Bilirubin: 0.9 mg/dL (ref 0.3–1.2)
Total Protein: 6.8 g/dL (ref 6.5–8.1)

## 2021-09-01 LAB — AEROBIC/ANAEROBIC CULTURE W GRAM STAIN (SURGICAL/DEEP WOUND)

## 2021-09-01 LAB — MAGNESIUM: Magnesium: 1.9 mg/dL (ref 1.7–2.4)

## 2021-09-01 MED ORDER — ACETAMINOPHEN 500 MG PO TABS
1000.0000 mg | ORAL_TABLET | Freq: Four times a day (QID) | ORAL | Status: AC
  Administered 2021-09-01 – 2021-09-02 (×4): 1000 mg via ORAL
  Filled 2021-09-01 (×4): qty 2

## 2021-09-01 MED ORDER — SODIUM CHLORIDE 0.9 % IV SOLN
3.0000 g | Freq: Four times a day (QID) | INTRAVENOUS | Status: DC
  Administered 2021-09-01 – 2021-09-05 (×16): 3 g via INTRAVENOUS
  Filled 2021-09-01 (×17): qty 8

## 2021-09-01 NOTE — Consult Note (Signed)
[facility] for Infect[facility]    Date of Admission:  08/27/2021   Total days of inpatient antibiotics 4        Reason for Consult: Intra-abdominal Abcessess    Principal Problem:   Perforated diverticulum Active Problems:   CAD (coronary artery disease)   Hyperlipidemia   Prostate cancer (HCC)   Severe obesity (BMI 35.0-39.9) with comorbidity [facility])   Assessment: 61 year old male with history of prostate cancer SP radical prostatectomy, CAD status post stent placement admitted for intra-abdominal abscesses.  He underwent CT-guided aspiration but continued fever as such ID engaged.  #Persistent fever #Diverticulitis with abacess SP drain  placement+ Ecoli(pan S) #Hepatic abscess SP drain placement with  Cx NG -CT showed numerous rim-enhancing low-attenuation lesions in level most likely reflecting hepatic abscesses.  Distal descending colon and upper sigmoid colon diverticulitis and on 6.3 X4.8X 4.3 cm complex diverticula abscess along anterior aspect of bladder -Pt underwent CT guided aspiration on 7/12 of anterior pelvic abscess, 10cc of frankly purulent material noted and hepatic abscess with 15cc pf purulent material aspirated. -Continue to fever on pip-tazo.  I suspect perforated diverticulitis  lead to the hepatic abscess.  Blood Cx remain negative.   Recommendations:  -D/C piptazo -Start Unasyn -Repeat CT AP.   -Follow Cx from pelvic abscess -If abscesses aspirated again, send for cytology and Cx  #Urine Cx of Efaeclic on 7/5  99-37J colonies SP fosfomycin on 7/8-treated -Pt had denies dysuria -Per surgical documentation c/f colovesical fistula. CT did not showe evidence of fistula.  -Antibiotics as above.  Microbiology:   Antibiotics: Cefepime an dmetro 710-11 Pip-tazo 7/12-p  Cultures: Blood 7/10 NG 7/11 NG Urine 7/10NG    HPI: [name] [name] is a 61 y.o. male with CAD status post MI with stent, HLD, HTN, obesity, OSA, history of  prostate cancer admitted with intra-abdominal abscesses.  Patient presented with 11-day history of fever, cough, body aches and generalized malaise with low appetite.  On arrival to the ED patient afebrile WBC 7K.  CT showed diverticular abscess and numerous hepatic lesions concerning for abscesses.  He was initially started on cefepime and metronidazole then transitioned to pip-tazo.  He underwent a CT-guided aspiration of liver and pelvic abscesses.  ID engaged as patient continued to fever.   Review of Systems: Review of Systems  All other systems reviewed and are negative.   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 Has left to right collaterals. Last cath in 2009 with stent to to LAD. 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   Obesity    OSA (obstructive sleep apnea) 03/01/2014   Severe with AHI 72.5 events/hr; no CPAP in use since weight loss    Prostate cancer ([place])    Sleep apnea    Stress     Social History   Tobacco Use   Smoking status: Never   Smokeless tobacco: Never  Vaping Use   Vaping Use: Never used  Substance Use Topics   Alcohol use: Yes    Comment: occ    2-3 drinka a week   Drug use: Not Currently    Types: Marijuana    Comment: last week was last  use THC CBD balm    Family History  Problem Relation Age of Onset   Hypertension Mother    Lung cancer Father        smoked three packs per day   Prostate cancer Neg Hx    Colon cancer Neg Hx    Pancreatic cancer Neg Hx    Breast cancer Neg Hx    Scheduled Meds:  benzonatate  100 mg Oral TID   dextromethorphan-guaiFENesin  1 tablet Oral BID   fluticasone  2 spray Each Nare Daily   furosemide  40 mg Oral Daily   heparin  5,000 Units Subcutaneous Q8H   metoprolol succinate  25 mg Oral Daily   pantoprazole  (PROTONIX) IV  40 mg Intravenous Q24H   saccharomyces boulardii  250 mg Oral BID   sodium chloride flush  5 mL Intracatheter Q8H   Continuous Infusions:  piperacillin-tazobactam (ZOSYN)  IV 3.375 g (09/01/21 1221)   PRN Meds:.acetaminophen, albuterol, butalbital-acetaminophen-caffeine, HYDROmorphone (DILAUDID) injection, ondansetron **OR** ondansetron (ZOFRAN) IV, oxyCODONE, simethicone No Known Allergies  OBJECTIVE: Blood pressure 117/75, pulse 68, temperature 98.4 F (36.9 C), temperature source Oral, resp. rate 17, height 5' 10.5" (1.791 m), weight 120.6 kg, SpO2 97 %.  Physical Exam Constitutional:      General: He is not in acute distress.    Appearance: He is normal weight. He is not toxic-appearing.  HENT:     Head: Normocephalic and atraumatic.     Right Ear: External ear normal.     Left Ear: External ear normal.     Nose: No congestion or rhinorrhea.     Mouth/Throat:     Mouth: Mucous membranes are moist.     Pharynx: Oropharynx is clear.  Eyes:     Extraocular Movements: Extraocular movements intact.     Conjunctiva/sclera: Conjunctivae normal.     Pupils: Pupils are equal, round, and reactive to light.  Cardiovascular:     Rate and Rhythm: Normal rate and regular rhythm.     Heart sounds: No murmur heard.    No friction rub. No gallop.  Pulmonary:     Effort: Pulmonary effort is normal.     Breath sounds: Normal breath sounds.  Abdominal:     General: Abdomen is flat. Bowel sounds are normal.     Palpations: Abdomen is soft.     Comments: 2 drains  Musculoskeletal:        General: No swelling. Normal range of motion.     Cervical back: Normal range of motion and neck supple.  Skin:    General: Skin is warm and dry.  Neurological:     General: No focal deficit present.     Mental Status: He is oriented to person, place, and time.  Psychiatric:        Mood and Affect: Mood normal.     Lab Results Lab Results  Component Value Date   WBC 11.9 (H)  09/01/2021   HGB 11.0 (L) 09/01/2021   HCT 32.7 (L) 09/01/2021   MCV 92.9 09/01/2021   PLT 314 09/01/2021    Lab Results  Component Value Date   CREATININE 1.21 09/01/2021   BUN 12 09/01/2021   NA 133 (L) 09/01/2021   K 3.8 09/01/2021   CL 101 09/01/2021   CO2 21 (L) 09/01/2021    Lab Results  Component Value Date   ALT 41 09/01/2021   AST 41 09/01/2021   ALKPHOS 110 09/01/2021   BILITOT 0.9 09/01/2021  Laurice Record, MD [facility] 09/01/2021, 1:41 PM

## 2021-09-01 NOTE — Progress Notes (Addendum)
Pt says he experienced stinging in urethra when urinating and pain in the lower abdomen and anus when having a BM. Pt is also having trouble controlling BM.

## 2021-09-01 NOTE — Progress Notes (Signed)
Mobility Specialist Progress Note:   09/01/21 1215  Mobility  Activity Ambulated independently in hallway  Level of Assistance Independent  Assistive Device None  Distance Ambulated (ft) 550 ft  Activity Response Tolerated well  $Mobility charge 1 Mobility   Pt eager for mobility session. Required no physical assist throughout. Pt with no complaints during session. Back in chair with all needs met.   [facility] Secure Chat or Office Phone: [phone]

## 2021-09-01 NOTE — Progress Notes (Signed)
Patient ID: [name] [name], male   DOB: June 16, 1960, 61 y.o.   MRN: [id number] [facility] Surgery Progress Note:   * No surgery found *  Subjective: Mental status is ~ alert and talkative.  Complaints -many concerning his pain with urination, his drains. Objective: Vital signs in last 24 hours: Temp:  [98.6 F (37 C)-102.8 F (39.3 C)] 99.9 F (37.7 C) (07/15 0357) Pulse Rate:  [66-77] 75 (07/15 0357) Resp:  [17-19] 19 (07/15 0357) BP: (114-137)/(68-84) 115/68 (07/15 0357) SpO2:  [97 %] 97 % (07/15 0357) Weight:  [120.6 kg-121.4 kg] 120.6 kg (07/15 0412)  Intake/Output from previous day: 07/14 0701 - 07/15 0700 In: 780 [P.O.:760] Out: 425 [Urine:300; Drains:125] Intake/Output this shift: No intake/output data recorded.  Physical Exam: Work of breathing is not labored.  Two percutaneous drains with yellow drainage from abscess in pelvis and liver abscesses.    Lab Results:  Results for orders placed or performed during the hospital encounter of 08/27/21 (from the past 48 hour(s))  Procalcitonin     Status: None   Collection Time: 08/31/21  1:13 AM  Result Value Ref Range   Procalcitonin 0.97 ng/mL    Comment:        Interpretation: PCT > 0.5 ng/mL and <= 2 ng/mL: Systemic infection (sepsis) is possible, but other conditions are known to elevate PCT as well. (NOTE)       Sepsis PCT Algorithm           Lower Respiratory Tract                                      Infection PCT Algorithm    ----------------------------     ----------------------------         PCT < 0.25 ng/mL                PCT < 0.10 ng/mL          Strongly encourage             Strongly discourage   discontinuation of antibiotics    initiation of antibiotics    ----------------------------     -----------------------------       PCT 0.25 - 0.50 ng/mL            PCT 0.10 - 0.25 ng/mL               OR       >80% decrease in PCT            Discourage initiation of                                             antibiotics      Encourage discontinuation           of antibiotics    ----------------------------     -----------------------------         PCT >= 0.50 ng/mL              PCT 0.26 - 0.50 ng/mL                AND       <80% decrease in PCT             Encourage initiation of  antibiotics       Encourage continuation           of antibiotics    ----------------------------     -----------------------------        PCT >= 0.50 ng/mL                  PCT > 0.50 ng/mL               AND         increase in PCT                  Strongly encourage                                      initiation of antibiotics    Strongly encourage escalation           of antibiotics                                     -----------------------------                                           PCT <= 0.25 ng/mL                                                 OR                                        > 80% decrease in PCT                                      Discontinue / Do not initiate                                             antibiotics  Performed at [facility], [address] [place], [place] [address]   CBC with Differential/Platelet     Status: Abnormal   Collection Time: 08/31/21  1:13 AM  Result Value Ref Range   WBC 12.7 (H) 4.0 - 10.5 K/uL   RBC 3.55 (L) 4.22 - 5.81 MIL/uL   Hemoglobin 11.1 (L) 13.0 - 17.0 g/dL   HCT 33.2 (L) 39.0 - 52.0 %   MCV 93.5 80.0 - 100.0 fL   MCH 31.3 26.0 - 34.0 pg   MCHC 33.4 30.0 - 36.0 g/dL   RDW 13.6 11.5 - 15.5 %   Platelets 325 150 - 400 K/uL   nRBC 0.0 0.0 - 0.2 %   Neutrophils Relative % 80 %   Neutro Abs 10.1 (H) 1.7 - 7.7 K/uL   Lymphocytes Relative 11 %   Lymphs Abs 1.4 0.7 - 4.0 K/uL   Monocytes Relative 8 %   Monocytes Absolute 1.0 0.1 - 1.0 K/uL   Eosinophils  Relative 0 %   Eosinophils Absolute 0.1 0.0 - 0.5 K/uL   Basophils Relative 0 %   Basophils Absolute 0.1 0.0 - 0.1 K/uL    Immature Granulocytes 1 %   Abs Immature Granulocytes 0.14 (H) 0.00 - 0.07 K/uL    Comment: Performed at [facility] [address] [place], [place] [address]  Comprehensive metabolic panel     Status: Abnormal   Collection Time: 08/31/21  1:13 AM  Result Value Ref Range   Sodium 133 (L) 135 - 145 mmol/L   Potassium 4.0 3.5 - 5.1 mmol/L   Chloride 101 98 - 111 mmol/L   CO2 22 22 - 32 mmol/L   Glucose, Bld 136 (H) 70 - 99 mg/dL    Comment: Glucose reference range applies only to samples taken after fasting for at least 8 hours.   BUN 15 8 - 23 mg/dL   Creatinine, Ser 1.63 (H) 0.61 - 1.24 mg/dL   Calcium 8.0 (L) 8.9 - 10.3 mg/dL   Total Protein 7.2 6.5 - 8.1 g/dL   Albumin 2.0 (L) 3.5 - 5.0 g/dL   AST 33 15 - 41 U/L   ALT 42 0 - 44 U/L   Alkaline Phosphatase 123 38 - 126 U/L   Total Bilirubin 0.9 0.3 - 1.2 mg/dL   GFR, Estimated 47 (L) >60 mL/min    Comment: (NOTE) Calculated using the CKD-EPI Creatinine Equation (2021)    Anion gap 10 5 - 15    Comment: Performed at [facility], [place] [address] [place], [place] [address]  Magnesium     Status: None   Collection Time: 08/31/21  1:13 AM  Result Value Ref Range   Magnesium 2.0 1.7 - 2.4 mg/dL    Comment: Performed at [facility] [address], [place], [place] [address]  CBC with Differential/Platelet     Status: Abnormal   Collection Time: 09/01/21  1:06 AM  Result Value Ref Range   WBC 11.9 (H) 4.0 - 10.5 K/uL   RBC 3.52 (L) 4.22 - 5.81 MIL/uL   Hemoglobin 11.0 (L) 13.0 - 17.0 g/dL   HCT 32.7 (L) 39.0 - 52.0 %   MCV 92.9 80.0 - 100.0 fL   MCH 31.3 26.0 - 34.0 pg   MCHC 33.6 30.0 - 36.0 g/dL   RDW 13.7 11.5 - 15.5 %   Platelets 314 150 - 400 K/uL   nRBC 0.0 0.0 - 0.2 %   Neutrophils Relative % 83 %   Neutro Abs 9.9 (H) 1.7 - 7.7 K/uL   Lymphocytes Relative 10 %   Lymphs Abs 1.1 0.7 - 4.0 K/uL   Monocytes Relative 6 %   Monocytes Absolute 0.7 0.1 - 1.0 K/uL   Eosinophils Relative 0 %    Eosinophils Absolute 0.1 0.0 - 0.5 K/uL   Basophils Relative 0 %   Basophils Absolute 0.0 0.0 - 0.1 K/uL   Immature Granulocytes 1 %   Abs Immature Granulocytes 0.11 (H) 0.00 - 0.07 K/uL    Comment: Performed at [facility] [address] [place], [place] [address]  Comprehensive metabolic panel     Status: Abnormal   Collection Time: 09/01/21  1:06 AM  Result Value Ref Range   Sodium 133 (L) 135 - 145 mmol/L   Potassium 3.8 3.5 - 5.1 mmol/L   Chloride 101 98 - 111 mmol/L   CO2 21 (L) 22 - 32 mmol/L   Glucose, Bld 139 (H)  70 - 99 mg/dL    Comment: Glucose reference range applies only to samples taken after fasting for at least 8 hours.   BUN 12 8 - 23 mg/dL   Creatinine, Ser 1.21 0.61 - 1.24 mg/dL   Calcium 8.2 (L) 8.9 - 10.3 mg/dL   Total Protein 6.8 6.5 - 8.1 g/dL   Albumin 2.1 (L) 3.5 - 5.0 g/dL   AST 41 15 - 41 U/L   ALT 41 0 - 44 U/L   Alkaline Phosphatase 110 38 - 126 U/L   Total Bilirubin 0.9 0.3 - 1.2 mg/dL   GFR, Estimated >60 >60 mL/min    Comment: (NOTE) Calculated using the CKD-EPI Creatinine Equation (2021)    Anion gap 11 5 - 15    Comment: Performed at [facility], [place] [address], [place], [place] [address]  Magnesium     Status: None   Collection Time: 09/01/21  1:06 AM  Result Value Ref Range   Magnesium 1.9 1.7 - 2.4 mg/dL    Comment: Performed at [facility] [address] [place], [place] [address]    Radiology/Results: DG CHEST PORT 1 VIEW  Result Date: 08/31/2021 CLINICAL DATA:  Ongoing fever and productive cough for 16 days EXAM: PORTABLE CHEST - 1 VIEW COMPARISON:  08/27/2021 FINDINGS: Cardiomediastinal silhouette and pulmonary vasculature are within normal limits. Lungs are clear. Moderate right glenohumeral osteoarthrosis. IMPRESSION: No acute cardiopulmonary process. Electronically Signed   By: Miachel Roux M.D.   On: 08/31/2021 09:29   CT HEAD WO CONTRAST (5MM)  Result Date: 08/30/2021 CLINICAL DATA:  Headache EXAM: CT  HEAD WITHOUT CONTRAST TECHNIQUE: Contiguous axial images were obtained from the base of the skull through the vertex without intravenous contrast. RADIATION DOSE REDUCTION: This exam was performed according to the departmental dose-optimization program which includes automated exposure control, adjustment of the mA and/or kV according to patient size and/or use of iterative reconstruction technique. COMPARISON:  None Available. FINDINGS: Brain: No evidence of acute infarction, hemorrhage, hydrocephalus, extra-axial collection or mass lesion/mass effect. Vascular: No hyperdense vessel or unexpected calcification. Skull: Normal. Negative for fracture or focal lesion. Sinuses/Orbits: No acute finding. Other: None. IMPRESSION: No acute intracranial abnormality. Electronically Signed   By: Yetta Glassman M.D.   On: 08/30/2021 14:24    Anti-infectives: Anti-infectives (From admission, onward)    Start     Dose/Rate Route Frequency Ordered Stop   08/29/21 1200  piperacillin-tazobactam (ZOSYN) IVPB 3.375 g        3.375 g 12.5 mL/hr over 240 Minutes Intravenous Every 8 hours 08/29/21 0928     08/28/21 0400  ceFEPIme (MAXIPIME) 2 g in sodium chloride 0.9 % 100 mL IVPB  Status:  Discontinued        2 g 200 mL/hr over 30 Minutes Intravenous Every 8 hours 08/27/21 2103 08/29/21 0844   08/27/21 2145  metroNIDAZOLE (FLAGYL) IVPB 500 mg  Status:  Discontinued        500 mg 100 mL/hr over 60 Minutes Intravenous Every 12 hours 08/27/21 2142 08/29/21 0844   08/27/21 1730  ceFEPIme (MAXIPIME) 2 g in sodium chloride 0.9 % 100 mL IVPB        2 g 200 mL/hr over 30 Minutes Intravenous  Once 08/27/21 1721 08/27/21 1948   08/27/21 1730  metroNIDAZOLE (FLAGYL) IVPB 500 mg        500 mg 100 mL/hr over 60 Minutes Intravenous  Once 08/27/21 1721 08/27/21 2110  Assessment/Plan: Problem List: Patient Active Problem List   Diagnosis Date Noted   Perforated diverticulum 08/27/2021   Prostate cancer ([place])  06/09/2019   History of gout 10/05/2018   Elevated PSA 09/24/2018   History of total knee arthroplasty 07/31/2018   Severe obesity (BMI 35.0-39.9) with comorbidity ([place]) 04/15/2018   Bilateral impacted cerumen 12/31/2017   Dysphonia 12/31/2017   Aftercare 12/08/2017   Traumatic arthritis of knee, left 10/23/2017   Traumatic arthritis of left knee 10/23/2017   Primary osteoarthritis of left knee 10/07/2017   Abnormal nuclear cardiac imaging test 09/24/2017   Ejection fraction < 50% 09/24/2017   Pain in left knee 07/01/2017   Obesity (BMI 30-39.9) 05/25/2014   OSA (obstructive sleep apnea) 03/01/2014   CAD (coronary artery disease) 10/17/2010   HTN (hypertension) 10/17/2010   Hyperlipidemia 10/17/2010   Situational stress 10/17/2010    WBC down a little but still with temp to 101.  Will get followup CT scan tomorrow to assess liver and pelvic drainage to see if there are undrained collections.  * No surgery found *    LOS: 5 days   Matt B. Hassell Done, MD, [facility] [phone] to reach the surgeon on call.    09/01/2021 7:25 AM

## 2021-09-01 NOTE — Progress Notes (Signed)
Pharmacy Antibiotic Note  [name] [name] is a 61 y.o. male admitted on 08/27/2021 with  intra-abdominal infection .  Pharmacy has been consulted for Unasyn dosing.  Plan: Unasyn  3 grams every 6 hours   Height: 5' 10.5" (179.1 cm) Weight: 120.6 kg (265 lb 14 oz) IBW/kg (Calculated) : 74.15  Temp (24hrs), Avg:99.8 F (37.7 C), Min:98.4 F (36.9 C), Max:101.2 F (38.4 C)  Recent Labs  Lab 08/27/21 1824 08/27/21 1945 08/27/21 2138 08/28/21 0300 08/28/21 0319 08/29/21 0236 08/30/21 0101 08/31/21 0113 09/01/21 0106  WBC 15.9*  --    < > 15.0*  --  14.0* 13.6* 12.7* 11.9*  CREATININE 1.45*  --    < > 1.25*  --  1.37* 1.27* 1.63* 1.21  LATICACIDVEN 1.8 1.7  --   --  1.7  --   --   --   --    < > = values in this interval not displayed.    Estimated Creatinine Clearance: 83.1 mL/min (by C-G formula based on SCr of 1.21 mg/dL).    No Known Allergies  Antimicrobials this admission: 7/10 Flagyl >> 7/10 (one dose) 7/10 Cefepime >> 7/12 7/12 Zosyn >> 7/15 7/15 Unasyn >>   Dose adjustments this admission: No dose adj. required  Microbiology results: 7/10 BCx: NGTD 7/10 UCx: NGTD (previous Ucx: enterococcus PTA)  7/11 abd abscess- rare GNR- growing pan sensitive E. coli 7/11 resp panel- negative 7/11 strep pneumo- negative  Thank you for allowing pharmacy to be a part of this patient's care.  Sandford Craze, PharmD. [facility] Acute Care PGY-1  09/01/2021 6:46 PM

## 2021-09-01 NOTE — Progress Notes (Signed)
[facility]                                                                               [name] [name], is a 61 y.o. male, DOB - 02-03-1961, QMG:[id number] Admit date - 08/27/2021    Outpatient Primary MD for the patient is [facility]  LOS - 5  days    Brief summary   61 year old male with past medical history of CAD, HTN, HLD, obesity, OSA, prostate cancer presented to the hospital with complaints of fever and chills secondary to perforated sigmoid diverticulitis and liver abscess.  General surgery on board.ID consulted for persistent fevers.     Assessment & Plan    Assessment and Plan:  Acute perforated sigmoid diverticulitis with large complex diverticular abscess:  IR consulted and he underwent 2 drains placed. Cultures from the abscess growing E coli.  Patient has been on IV zosyn for the last 4 days, but continues to spike fevers,. Though leukocytosis is improving.  Recommend getting repeat Blood cultures, cxr does not show any new pneumonia.  ID consulted and antibiotics changed to rocephin and flagyl.  Plan for repeat CT abd and pelvis.  Pain control.  Symptomatic management with IV anti emetics.  Currently on clear liquid diet.    Liver lesions:  MRI showing multiple liver lesions concerning for abscesses.  IR drain in place.    Ascending aortic aneurysm:  Recommend outpatient follow up annually.    Chronic lower extremity edema:  Recommend ambulation.  Venous duplex is negative for DVT.     Enterococcus UTI:  Treated.    Body mass index is 37.61 kg/m. Poor prognosis.    OSA:  On CPAP.    Hypertension:  BP parameters are optimal today.    Headache resolved.    Mild hyponatremia:  / fluid overload.    Mild normocytic anemia.        Estimated body mass index is 37.61 kg/m as calculated from the following:   Height as of this encounter: 5' 10.5" (1.791 m).   Weight as of this  encounter: 120.6 kg.  Code Status: full code.  DVT Prophylaxis:  Place TED hose Start: 08/31/21 0838 heparin injection 5,000 Units Start: 08/29/21 2200   Level of Care: Level of care: Telemetry Medical Family Communication: family at bedside.   Disposition Plan:     Remains inpatient appropriate:  IV antibiotics, perforated sigmoid diverticulitis.   Procedures:  CT abd pelvis in am.   Consultants:   General surgery  ID.   Antimicrobials:   Anti-infectives (From admission, onward)    Start     Dose/Rate Route Frequency Ordered Stop   08/29/21 1200  piperacillin-tazobactam (ZOSYN) IVPB 3.375 g        3.375 g 12.5 mL/hr over 240 Minutes Intravenous Every 8 hours 08/29/21 0928     08/28/21 0400  ceFEPIme (MAXIPIME) 2 g in sodium chloride 0.9 % 100 mL IVPB  Status:  Discontinued        2 g 200 mL/hr over 30 Minutes Intravenous Every 8 hours 08/27/21 2103 08/29/21 0844   08/27/21 2145  metroNIDAZOLE (FLAGYL) IVPB 500 mg  Status:  Discontinued        500 mg 100 mL/hr over 60 Minutes Intravenous Every 12 hours 08/27/21 2142 08/29/21 0844   08/27/21 1730  ceFEPIme (MAXIPIME) 2 g in sodium chloride 0.9 % 100 mL IVPB        2 g 200 mL/hr over 30 Minutes Intravenous  Once 08/27/21 1721 08/27/21 1948   08/27/21 1730  metroNIDAZOLE (FLAGYL) IVPB 500 mg        500 mg 100 mL/hr over 60 Minutes Intravenous  Once 08/27/21 1721 08/27/21 2110        Medications  Scheduled Meds:  benzonatate  100 mg Oral TID   dextromethorphan-guaiFENesin  1 tablet Oral BID   fluticasone  2 spray Each Nare Daily   furosemide  40 mg Oral Daily   heparin  5,000 Units Subcutaneous Q8H   metoprolol succinate  25 mg Oral Daily   pantoprazole (PROTONIX) IV  40 mg Intravenous Q24H   saccharomyces boulardii  250 mg Oral BID   sodium chloride flush  5 mL Intracatheter Q8H   Continuous Infusions:  piperacillin-tazobactam (ZOSYN)  IV 3.375 g (09/01/21 1221)   PRN Meds:.acetaminophen, albuterol,  butalbital-acetaminophen-caffeine, HYDROmorphone (DILAUDID) injection, ondansetron **OR** ondansetron (ZOFRAN) IV, oxyCODONE, simethicone    Subjective:   [name] [name] was seen and examined today.  No nausea, vomiting , abd pain resolved.   Objective:   Vitals:   09/01/21 0412 09/01/21 0726 09/01/21 0900 09/01/21 1544  BP:  126/77 117/75 124/74  Pulse:  69 68 76  Resp:  '16 17 16  '$ Temp:  (!) 101.2 F (38.4 C) 98.4 F (36.9 C) (!) 101 F (38.3 C)  TempSrc:  Oral Oral Oral  SpO2:  99% 97% 98%  Weight: 120.6 kg     Height:        Intake/Output Summary (Last 24 hours) at 09/01/2021 1744 Last data filed at 09/01/2021 1500 Gross per 24 hour  Intake 250 ml  Output 435 ml  Net -185 ml   Filed Weights   08/29/21 0431 08/31/21 0758 09/01/21 0412  Weight: 120.4 kg 121.4 kg 120.6 kg     Exam  General exam: Appears calm and comfortable  Respiratory system: Clear to auscultation. Respiratory effort normal. Cardiovascular system: S1 & S2 heard, RRR. No JVD, murmurs, rubs, gallops or clicks. No pedal edema. Gastrointestinal system: Abdomen is soft distended, mild abd pain, 2 drains in place.  Normal bowel sounds heard. Central nervous system: Alert and oriented. No focal neurological deficits. Extremities: bilateral pedal edema.  Skin: No rashes,  Psychiatry: Mood & affect appropriate.    Data Reviewed:  I have personally reviewed following labs and imaging studies   CBC Lab Results  Component Value Date   WBC 11.9 (H) 09/01/2021   RBC 3.52 (L) 09/01/2021   HGB 11.0 (L) 09/01/2021   HCT 32.7 (L) 09/01/2021   MCV 92.9 09/01/2021   MCH 31.3 09/01/2021   PLT 314 09/01/2021   MCHC 33.6 09/01/2021   RDW 13.7 09/01/2021   LYMPHSABS 1.1 09/01/2021   MONOABS 0.7 09/01/2021   EOSABS 0.1 09/01/2021   BASOSABS 0.0 41/32/4401     Last metabolic panel Lab Results  Component Value Date   NA 133 (L) 09/01/2021   K 3.8 09/01/2021   CL 101 09/01/2021   CO2 21 (L)  09/01/2021   BUN 12 09/01/2021   CREATININE 1.21 09/01/2021   GLUCOSE 139 (H) 09/01/2021   GFRNONAA >60 09/01/2021   GFRAA 55 (L) 10/30/2019   CALCIUM  8.2 (L) 09/01/2021   PHOS 3.1 08/29/2021   PROT 6.8 09/01/2021   ALBUMIN 2.1 (L) 09/01/2021   BILITOT 0.9 09/01/2021   ALKPHOS 110 09/01/2021   AST 41 09/01/2021   ALT 41 09/01/2021   ANIONGAP 11 09/01/2021    CBG (last 3)  No results for input(s): "GLUCAP" in the last 72 hours.    Coagulation Profile: Recent Labs  Lab 08/27/21 1824 08/28/21 0300  INR 1.2 1.2     Radiology Studies: DG CHEST PORT 1 VIEW  Result Date: 08/31/2021 CLINICAL DATA:  Ongoing fever and productive cough for 16 days EXAM: PORTABLE CHEST - 1 VIEW COMPARISON:  08/27/2021 FINDINGS: Cardiomediastinal silhouette and pulmonary vasculature are within normal limits. Lungs are clear. Moderate right glenohumeral osteoarthrosis. IMPRESSION: No acute cardiopulmonary process. Electronically Signed   By: Miachel Roux M.D.   On: 08/31/2021 09:29       Hosie Poisson M.D. [facility] 09/01/2021, 5:44 PM  Available via Epic secure chat 7am-7pm After 7 pm, please refer to night coverage provider listed on amion.

## 2021-09-02 ENCOUNTER — Encounter (HOSPITAL_COMMUNITY): Payer: Self-pay | Admitting: Internal Medicine

## 2021-09-02 ENCOUNTER — Inpatient Hospital Stay (HOSPITAL_COMMUNITY): Payer: BC Managed Care – PPO

## 2021-09-02 DIAGNOSIS — K5780 Diverticulitis of intestine, part unspecified, with perforation and abscess without bleeding: Secondary | ICD-10-CM | POA: Diagnosis not present

## 2021-09-02 DIAGNOSIS — E785 Hyperlipidemia, unspecified: Secondary | ICD-10-CM | POA: Diagnosis not present

## 2021-09-02 LAB — BASIC METABOLIC PANEL
Anion gap: 11 (ref 5–15)
BUN: 9 mg/dL (ref 8–23)
CO2: 22 mmol/L (ref 22–32)
Calcium: 8.2 mg/dL — ABNORMAL LOW (ref 8.9–10.3)
Chloride: 102 mmol/L (ref 98–111)
Creatinine, Ser: 1.03 mg/dL (ref 0.61–1.24)
GFR, Estimated: >60 mL/min (ref >60)
Glucose, Bld: 128 mg/dL — ABNORMAL HIGH (ref 70–99)
Potassium: 4.1 mmol/L (ref 3.5–5.1)
Sodium: 135 mmol/L (ref 135–145)

## 2021-09-02 LAB — CBC WITH DIFFERENTIAL/PLATELET
Abs Immature Granulocytes: 0.11 10*3/uL — ABNORMAL HIGH (ref 0.00–0.07)
Basophils Absolute: 0 10*3/uL (ref 0.0–0.1)
Basophils Relative: 0 %
Eosinophils Absolute: 0.1 10*3/uL (ref 0.0–0.5)
Eosinophils Relative: 0 %
HCT: 34.2 % — ABNORMAL LOW (ref 39.0–52.0)
Hemoglobin: 11.3 g/dL — ABNORMAL LOW (ref 13.0–17.0)
Immature Granulocytes: 1 %
Lymphocytes Relative: 8 %
Lymphs Abs: 1 10*3/uL (ref 0.7–4.0)
MCH: 30.9 pg (ref 26.0–34.0)
MCHC: 33 g/dL (ref 30.0–36.0)
MCV: 93.4 fL (ref 80.0–100.0)
Monocytes Absolute: 0.8 10*3/uL (ref 0.1–1.0)
Monocytes Relative: 6 %
Neutro Abs: 11.3 10*3/uL — ABNORMAL HIGH (ref 1.7–7.7)
Neutrophils Relative %: 85 %
Platelets: 367 10*3/uL (ref 150–400)
RBC: 3.66 MIL/uL — ABNORMAL LOW (ref 4.22–5.81)
RDW: 13.5 % (ref 11.5–15.5)
WBC: 13.2 10*3/uL — ABNORMAL HIGH (ref 4.0–10.5)
nRBC: 0 % (ref 0.0–0.2)

## 2021-09-02 LAB — AEROBIC/ANAEROBIC CULTURE W GRAM STAIN (SURGICAL/DEEP WOUND): Culture: NO GROWTH

## 2021-09-02 LAB — MAGNESIUM: Magnesium: 2 mg/dL (ref 1.7–2.4)

## 2021-09-02 LAB — PHOSPHORUS: Phosphorus: 3.2 mg/dL (ref 2.5–4.6)

## 2021-09-02 MED ORDER — IOHEXOL 300 MG/ML  SOLN
100.0000 mL | Freq: Once | INTRAMUSCULAR | Status: AC | PRN
  Administered 2021-09-02: 100 mL via INTRAVENOUS

## 2021-09-02 MED ORDER — IOHEXOL 9 MG/ML PO SOLN
ORAL | Status: AC
  Administered 2021-09-02: 500 mL via ORAL
  Filled 2021-09-02: qty 1000

## 2021-09-02 NOTE — Progress Notes (Signed)
Patient ID: [name] [name], male   DOB: 1960-06-09, 61 y.o.   MRN: [id number] [facility] - Inpatient Surgery Progress Note:   * No surgery found *   THE PLAN  For repeat CT scan this am to assess liver abscesses from his perforated diverticulitis.  This will be reviewed by ID and surgery.  Clinically he has improved since yesterday  Subjective: Mental status is clear.  Complaints fewer than yesterday. Objective: Vital signs in last 24 hours: Temp:  [98.2 F (36.8 C)-101.2 F (38.4 C)] 98.2 F (36.8 C) (07/16 0739) Pulse Rate:  [62-76] 62 (07/16 0739) Resp:  [15-20] 18 (07/16 0739) BP: (107-133)/(60-76) 133/76 (07/16 0739) SpO2:  [94 %-100 %] 100 % (07/16 0739) Weight:  [119.5 kg] 119.5 kg (07/16 0426)  Intake/Output from previous day: 07/15 0701 - 07/16 0700 In: 230 [P.O.:120; IV Piggyback:100] Out: 639 [Urine:500; Drains:139] Intake/Output this shift: Total I/O In: -  Out: 200 [Urine:200]  Physical Exam: Work of breathing is normal;  One JP is frankly bilious and the other with slight bilious stain  Lab Results:  Results for orders placed or performed during the hospital encounter of 08/27/21 (from the past 48 hour(s))  CBC with Differential/Platelet     Status: Abnormal   Collection Time: 09/01/21  1:06 AM  Result Value Ref Range   WBC 11.9 (H) 4.0 - 10.5 K/uL   RBC 3.52 (L) 4.22 - 5.81 MIL/uL   Hemoglobin 11.0 (L) 13.0 - 17.0 g/dL   HCT 32.7 (L) 39.0 - 52.0 %   MCV 92.9 80.0 - 100.0 fL   MCH 31.3 26.0 - 34.0 pg   MCHC 33.6 30.0 - 36.0 g/dL   RDW 13.7 11.5 - 15.5 %   Platelets 314 150 - 400 K/uL   nRBC 0.0 0.0 - 0.2 %   Neutrophils Relative % 83 %   Neutro Abs 9.9 (H) 1.7 - 7.7 K/uL   Lymphocytes Relative 10 %   Lymphs Abs 1.1 0.7 - 4.0 K/uL   Monocytes Relative 6 %   Monocytes Absolute 0.7 0.1 - 1.0 K/uL   Eosinophils Relative 0 %   Eosinophils Absolute 0.1 0.0 - 0.5 K/uL   Basophils Relative 0 %   Basophils Absolute 0.0 0.0 - 0.1 K/uL   Immature Granulocytes 1  %   Abs Immature Granulocytes 0.11 (H) 0.00 - 0.07 K/uL    Comment: Performed at [facility], [address] [place], [place] [address]  Comprehensive metabolic panel     Status: Abnormal   Collection Time: 09/01/21  1:06 AM  Result Value Ref Range   Sodium 133 (L) 135 - 145 mmol/L   Potassium 3.8 3.5 - 5.1 mmol/L   Chloride 101 98 - 111 mmol/L   CO2 21 (L) 22 - 32 mmol/L   Glucose, Bld 139 (H) 70 - 99 mg/dL    Comment: Glucose reference range applies only to samples taken after fasting for at least 8 hours.   BUN 12 8 - 23 mg/dL   Creatinine, Ser 1.21 0.61 - 1.24 mg/dL   Calcium 8.2 (L) 8.9 - 10.3 mg/dL   Total Protein 6.8 6.5 - 8.1 g/dL   Albumin 2.1 (L) 3.5 - 5.0 g/dL   AST 41 15 - 41 U/L   ALT 41 0 - 44 U/L   Alkaline Phosphatase 110 38 - 126 U/L   Total Bilirubin 0.9 0.3 - 1.2 mg/dL   GFR, Estimated >60 >60 mL/min    Comment: (NOTE) Calculated using  the CKD-EPI Creatinine Equation (2021)    Anion gap 11 5 - 15    Comment: Performed at [facility], [place] [address], [place], [place] [address]  Magnesium     Status: None   Collection Time: 09/01/21  1:06 AM  Result Value Ref Range   Magnesium 1.9 1.7 - 2.4 mg/dL    Comment: Performed at [facility] [address] [place], [place] [address]  Culture, blood (Routine X 2) w Reflex to ID Panel     Status: None (Preliminary result)   Collection Time: 09/01/21  2:02 PM   Specimen: BLOOD RIGHT HAND  Result Value Ref Range   Specimen Description BLOOD RIGHT HAND    Special Requests      BOTTLES DRAWN AEROBIC AND ANAEROBIC Blood Culture results may not be optimal due to an inadequate volume of blood received in culture bottles   Culture      NO GROWTH < 24 HOURS Performed at [facility] [address], [place], [place] [address]    Report Status PENDING   Culture, blood (Routine X 2) w Reflex to ID Panel     Status: None (Preliminary result)   Collection Time: 09/01/21  2:02 PM   Specimen: BLOOD  RIGHT HAND  Result Value Ref Range   Specimen Description BLOOD RIGHT HAND    Special Requests      BOTTLES DRAWN AEROBIC AND ANAEROBIC Blood Culture adequate volume   Culture      NO GROWTH < 24 HOURS Performed at [facility], [place] [address] [place], [place] [address]    Report Status PENDING     Radiology/Results: DG CHEST PORT 1 VIEW  Result Date: 08/31/2021 CLINICAL DATA:  Ongoing fever and productive cough for 16 days EXAM: PORTABLE CHEST - 1 VIEW COMPARISON:  08/27/2021 FINDINGS: Cardiomediastinal silhouette and pulmonary vasculature are within normal limits. Lungs are clear. Moderate right glenohumeral osteoarthrosis. IMPRESSION: No acute cardiopulmonary process. Electronically Signed   By: Miachel Roux M.D.   On: 08/31/2021 09:29    Anti-infectives: Anti-infectives (From admission, onward)    Start     Dose/Rate Route Frequency Ordered Stop   09/01/21 2000  Ampicillin-Sulbactam (UNASYN) 3 g in sodium chloride 0.9 % 100 mL IVPB        3 g 200 mL/hr over 30 Minutes Intravenous Every 6 hours 09/01/21 1859     08/29/21 1200  piperacillin-tazobactam (ZOSYN) IVPB 3.375 g  Status:  Discontinued        3.375 g 12.5 mL/hr over 240 Minutes Intravenous Every 8 hours 08/29/21 0928 09/01/21 1820   08/28/21 0400  ceFEPIme (MAXIPIME) 2 g in sodium chloride 0.9 % 100 mL IVPB  Status:  Discontinued        2 g 200 mL/hr over 30 Minutes Intravenous Every 8 hours 08/27/21 2103 08/29/21 0844   08/27/21 2145  metroNIDAZOLE (FLAGYL) IVPB 500 mg  Status:  Discontinued        500 mg 100 mL/hr over 60 Minutes Intravenous Every 12 hours 08/27/21 2142 08/29/21 0844   08/27/21 1730  ceFEPIme (MAXIPIME) 2 g in sodium chloride 0.9 % 100 mL IVPB        2 g 200 mL/hr over 30 Minutes Intravenous  Once 08/27/21 1721 08/27/21 1948   08/27/21 1730  metroNIDAZOLE (FLAGYL) IVPB 500 mg        500 mg 100 mL/hr over 60 Minutes Intravenous  Once 08/27/21 1721 08/27/21 2110  Assessment/Plan: Problem List: Patient Active Problem List   Diagnosis Date Noted   Perforated diverticulum 08/27/2021   Prostate cancer ([place]) 06/09/2019   History of gout 10/05/2018   Elevated PSA 09/24/2018   History of total knee arthroplasty 07/31/2018   Severe obesity (BMI 35.0-39.9) with comorbidity ([place]) 04/15/2018   Bilateral impacted cerumen 12/31/2017   Dysphonia 12/31/2017   Aftercare 12/08/2017   Traumatic arthritis of knee, left 10/23/2017   Traumatic arthritis of left knee 10/23/2017   Primary osteoarthritis of left knee 10/07/2017   Abnormal nuclear cardiac imaging test 09/24/2017   Ejection fraction < 50% 09/24/2017   Pain in left knee 07/01/2017   Obesity (BMI 30-39.9) 05/25/2014   OSA (obstructive sleep apnea) 03/01/2014   CAD (coronary artery disease) 10/17/2010   HTN (hypertension) 10/17/2010   Hyperlipidemia 10/17/2010   Situational stress 10/17/2010    Clinically improved.  Will need resective surgery at some point but hopefully that will be an elective procedure.   * No surgery found *    LOS: 6 days   Matt B. Hassell Done, MD, [facility] [phone] to reach the surgeon on call.    09/02/2021 8:38 AM

## 2021-09-02 NOTE — Progress Notes (Signed)
[facility]                                                                               [name] [name], is a 61 y.o. male, DOB - 04/08/1960, TIW:[id number] Admit date - 08/27/2021    Outpatient Primary MD for the patient is [facility]  LOS - 6  days    Brief summary   61 year old male with past medical history of CAD, HTN, HLD, obesity, OSA, prostate cancer presented to the hospital with complaints of fever and chills secondary to perforated sigmoid diverticulitis and liver abscess.  General surgery on board.ID consulted for persistent fevers. Overnight his T max is 101.2, worsening leukocytosis,. He is scheduled for a CT abdomen and pelvis.     Assessment & Plan    Assessment and Plan:  Acute perforated sigmoid diverticulitis with large complex diverticular abscess:  IR consulted and he underwent 2 drains placed. Cultures from the abscess growing E coli.  Patient has been on IV zosyn for the last 4 days, but continues to spike fevers, with worsening leukocytosis.  Recommend getting repeat Blood cultures, cxr does not show any new pneumonia.  ID consulted and antibiotics changed to rocephin and flagyl.  Plan for repeat CT abd and pelvis today.  Pain control.  Symptomatic management with IV anti emetics.  Currently on clear liquid diet. Continue the same.    Liver lesions:  MRI showing multiple liver lesions concerning for abscesses.  IR drain in place.    Ascending aortic aneurysm:  Recommend outpatient follow up annually.    Chronic lower extremity edema:  Recommend ambulation.  Venous duplex is negative for DVT.     Enterococcus UTI:  Treated.    Body mass index is 37.27 kg/m. Poor prognostic factor.    OSA:  On CPAP.    Hypertension:  BP parameters are optimal today.    Headache resolved.    Mild hyponatremia:  fluid overload? Resolved.    Mild normocytic anemia.  Hemoglobin stable around  11.       Estimated body mass index is 37.27 kg/m as calculated from the following:   Height as of this encounter: 5' 10.5" (1.791 m).   Weight as of this encounter: 119.5 kg.  Code Status: full code.  DVT Prophylaxis:  Place TED hose Start: 08/31/21 0838 heparin injection 5,000 Units Start: 08/29/21 2200   Level of Care: Level of care: Telemetry Medical Family Communication: family at bedside.   Disposition Plan:     Remains inpatient appropriate:  IV antibiotics, perforated sigmoid diverticulitis.   Procedures:  CT abd pelvis in am.   Consultants:   General surgery  ID.   Antimicrobials:   Anti-infectives (From admission, onward)    Start     Dose/Rate Route Frequency Ordered Stop   09/01/21 2000  Ampicillin-Sulbactam (UNASYN) 3 g in sodium chloride 0.9 % 100 mL IVPB        3 g 200 mL/hr over 30 Minutes Intravenous Every 6 hours 09/01/21 1859     08/29/21 1200  piperacillin-tazobactam (ZOSYN) IVPB 3.375 g  Status:  Discontinued        3.375 g  12.5 mL/hr over 240 Minutes Intravenous Every 8 hours 08/29/21 0928 09/01/21 1820   08/28/21 0400  ceFEPIme (MAXIPIME) 2 g in sodium chloride 0.9 % 100 mL IVPB  Status:  Discontinued        2 g 200 mL/hr over 30 Minutes Intravenous Every 8 hours 08/27/21 2103 08/29/21 0844   08/27/21 2145  metroNIDAZOLE (FLAGYL) IVPB 500 mg  Status:  Discontinued        500 mg 100 mL/hr over 60 Minutes Intravenous Every 12 hours 08/27/21 2142 08/29/21 0844   08/27/21 1730  ceFEPIme (MAXIPIME) 2 g in sodium chloride 0.9 % 100 mL IVPB        2 g 200 mL/hr over 30 Minutes Intravenous  Once 08/27/21 1721 08/27/21 1948   08/27/21 1730  metroNIDAZOLE (FLAGYL) IVPB 500 mg        500 mg 100 mL/hr over 60 Minutes Intravenous  Once 08/27/21 1721 08/27/21 2110        Medications  Scheduled Meds:  benzonatate  100 mg Oral TID   dextromethorphan-guaiFENesin  1 tablet Oral BID   fluticasone  2 spray Each Nare Daily   furosemide  40 mg Oral Daily    heparin  5,000 Units Subcutaneous Q8H   metoprolol succinate  25 mg Oral Daily   pantoprazole (PROTONIX) IV  40 mg Intravenous Q24H   saccharomyces boulardii  250 mg Oral BID   sodium chloride flush  5 mL Intracatheter Q8H   Continuous Infusions:  ampicillin-sulbactam (UNASYN) IV 3 g (09/02/21 1417)   PRN Meds:.albuterol, butalbital-acetaminophen-caffeine, HYDROmorphone (DILAUDID) injection, ondansetron **OR** ondansetron (ZOFRAN) IV, oxyCODONE, simethicone    Subjective:   [name] [name] was seen and examined today. Frustrated in general. No nausea, vomiting or abdominal pain   Objective:   Vitals:   09/01/21 2119 09/02/21 0410 09/02/21 0426 09/02/21 0739  BP: 107/60 124/71  133/76  Pulse: 76 67  62  Resp: '15 20  18  '$ Temp: (!) 101.2 F (38.4 C) 98.5 F (36.9 C)  98.2 F (36.8 C)  TempSrc: Oral Oral  Oral  SpO2: 99% 94%  100%  Weight:   119.5 kg   Height:        Intake/Output Summary (Last 24 hours) at 09/02/2021 1430 Last data filed at 09/02/2021 0741 Gross per 24 hour  Intake 230 ml  Output 854 ml  Net -624 ml    Filed Weights   08/31/21 0758 09/01/21 0412 09/02/21 0426  Weight: 121.4 kg 120.6 kg 119.5 kg     Exam  General exam: Appears calm and comfortable  Respiratory system: Clear to auscultation. Respiratory effort normal. Cardiovascular system: S1 & S2 heard, RRR. No JVD, . No pedal edema. Gastrointestinal system: Abdomen is nondistended, soft and nontender.  Normal bowel sounds heard. Central nervous system: Alert and oriented. No focal neurological deficits. Extremities: Symmetric 5 x 5 power. Skin: No rashes, lesions or ulcers Psychiatry: Mood & affect appropriate.     Data Reviewed:  I have personally reviewed following labs and imaging studies   CBC Lab Results  Component Value Date   WBC 13.2 (H) 09/02/2021   RBC 3.66 (L) 09/02/2021   HGB 11.3 (L) 09/02/2021   HCT 34.2 (L) 09/02/2021   MCV 93.4 09/02/2021   MCH 30.9 09/02/2021    PLT 367 09/02/2021   MCHC 33.0 09/02/2021   RDW 13.5 09/02/2021   LYMPHSABS 1.0 09/02/2021   MONOABS 0.8 09/02/2021   EOSABS 0.1 09/02/2021   BASOSABS 0.0 09/02/2021  Last metabolic panel Lab Results  Component Value Date   NA 135 09/02/2021   K 4.1 09/02/2021   CL 102 09/02/2021   CO2 22 09/02/2021   BUN 9 09/02/2021   CREATININE 1.03 09/02/2021   GLUCOSE 128 (H) 09/02/2021   GFRNONAA >60 09/02/2021   GFRAA 55 (L) 10/30/2019   CALCIUM 8.2 (L) 09/02/2021   PHOS 3.2 09/02/2021   PROT 6.8 09/01/2021   ALBUMIN 2.1 (L) 09/01/2021   BILITOT 0.9 09/01/2021   ALKPHOS 110 09/01/2021   AST 41 09/01/2021   ALT 41 09/01/2021   ANIONGAP 11 09/02/2021    CBG (last 3)  No results for input(s): "GLUCAP" in the last 72 hours.    Coagulation Profile: Recent Labs  Lab 08/27/21 1824 08/28/21 0300  INR 1.2 1.2      Radiology Studies: No results found.     Hosie Poisson M.D. [facility] 09/02/2021, 2:30 PM  Available via Epic secure chat 7am-7pm After 7 pm, please refer to night coverage provider listed on amion.

## 2021-09-02 NOTE — Progress Notes (Signed)
Mobility Specialist Progress Note:   09/02/21 1400  Mobility  Activity Ambulated independently in hallway  Level of Assistance Standby assist, set-up cues, supervision of patient - no hands on  Assistive Device None  Distance Ambulated (ft) 550 ft  Activity Response Tolerated well  $Mobility charge 1 Mobility   Pt agreeable to mobility session. No physical assistance required. Pt back in chair with all needs met.   Nelta Numbers Acute Rehab Secure Chat or Office Phone: [phone]

## 2021-09-02 NOTE — Plan of Care (Signed)

## 2021-09-02 NOTE — Progress Notes (Signed)
Patient refuses NIV for the night. Patient states wears with humidification at home and its uncomfortable without it. Unfortunately the hospital provided equipment does not have a humidity reservoir chamber on the CPAP machine to accommodate his request. Patient does like the cool air.

## 2021-09-02 NOTE — Progress Notes (Signed)
Patient refuses NIV for the night. Patient states wears with humidification at home and its uncomfortable without. Unfortunately the hospital provided equipment does not have a humidity reservoir chamber on the CPAP machine to accommodate his request.

## 2021-09-03 DIAGNOSIS — K750 Abscess of liver: Secondary | ICD-10-CM | POA: Diagnosis present

## 2021-09-03 DIAGNOSIS — K5780 Diverticulitis of intestine, part unspecified, with perforation and abscess without bleeding: Secondary | ICD-10-CM | POA: Diagnosis not present

## 2021-09-03 DIAGNOSIS — E785 Hyperlipidemia, unspecified: Secondary | ICD-10-CM | POA: Diagnosis not present

## 2021-09-03 DIAGNOSIS — K5720 Diverticulitis of large intestine with perforation and abscess without bleeding: Secondary | ICD-10-CM | POA: Diagnosis present

## 2021-09-03 LAB — CULTURE, BLOOD (ROUTINE X 2)
Culture: NO GROWTH
Culture: NO GROWTH
Special Requests: ADEQUATE
Special Requests: ADEQUATE

## 2021-09-03 LAB — CBC WITH DIFFERENTIAL/PLATELET
Abs Immature Granulocytes: 0.12 10*3/uL — ABNORMAL HIGH (ref 0.00–0.07)
Basophils Absolute: 0.1 10*3/uL (ref 0.0–0.1)
Basophils Relative: 0 %
Eosinophils Absolute: 0 10*3/uL (ref 0.0–0.5)
Eosinophils Relative: 0 %
HCT: 33.5 % — ABNORMAL LOW (ref 39.0–52.0)
Hemoglobin: 11 g/dL — ABNORMAL LOW (ref 13.0–17.0)
Immature Granulocytes: 1 %
Lymphocytes Relative: 9 %
Lymphs Abs: 1.2 10*3/uL (ref 0.7–4.0)
MCH: 30.5 pg (ref 26.0–34.0)
MCHC: 32.8 g/dL (ref 30.0–36.0)
MCV: 92.8 fL (ref 80.0–100.0)
Monocytes Absolute: 0.7 10*3/uL (ref 0.1–1.0)
Monocytes Relative: 5 %
Neutro Abs: 11.7 10*3/uL — ABNORMAL HIGH (ref 1.7–7.7)
Neutrophils Relative %: 85 %
Platelets: 360 10*3/uL (ref 150–400)
RBC: 3.61 MIL/uL — ABNORMAL LOW (ref 4.22–5.81)
RDW: 13.3 % (ref 11.5–15.5)
WBC: 13.8 10*3/uL — ABNORMAL HIGH (ref 4.0–10.5)
nRBC: 0 % (ref 0.0–0.2)

## 2021-09-03 LAB — BASIC METABOLIC PANEL
Anion gap: 10 (ref 5–15)
BUN: 7 mg/dL — ABNORMAL LOW (ref 8–23)
CO2: 20 mmol/L — ABNORMAL LOW (ref 22–32)
Calcium: 8.2 mg/dL — ABNORMAL LOW (ref 8.9–10.3)
Chloride: 103 mmol/L (ref 98–111)
Creatinine, Ser: 1.09 mg/dL (ref 0.61–1.24)
GFR, Estimated: >60 mL/min (ref >60)
Glucose, Bld: 159 mg/dL — ABNORMAL HIGH (ref 70–99)
Potassium: 4.1 mmol/L (ref 3.5–5.1)
Sodium: 133 mmol/L — ABNORMAL LOW (ref 135–145)

## 2021-09-03 MED ORDER — ALLOPURINOL 100 MG PO TABS
100.0000 mg | ORAL_TABLET | Freq: Every day | ORAL | Status: DC
  Administered 2021-09-03 – 2021-09-05 (×3): 100 mg via ORAL
  Filled 2021-09-03 (×3): qty 1

## 2021-09-03 MED ORDER — SODIUM CHLORIDE 0.9 % IV SOLN
100.0000 mg | INTRAVENOUS | Status: DC
  Administered 2021-09-03 – 2021-09-05 (×3): 100 mg via INTRAVENOUS
  Filled 2021-09-03 (×4): qty 5

## 2021-09-03 MED ORDER — COLCHICINE 0.6 MG PO TABS
0.6000 mg | ORAL_TABLET | Freq: Two times a day (BID) | ORAL | Status: AC
  Administered 2021-09-03 – 2021-09-04 (×3): 0.6 mg via ORAL
  Filled 2021-09-03 (×3): qty 1

## 2021-09-03 NOTE — Progress Notes (Signed)
Mobility Specialist Progress Note:   09/03/21 1220  Mobility  Activity Ambulated with assistance in hallway  Level of Assistance Standby assist, set-up cues, supervision of patient - no hands on  Assistive Device None  Distance Ambulated (ft) 550 ft  Activity Response Tolerated well  $Mobility charge 1 Mobility   Pt agreeable to mobility session. Asx throughout. Back in chair with all needs met.   Nelta Numbers Acute Rehab Secure Chat or Office Phone: [phone]

## 2021-09-03 NOTE — Progress Notes (Signed)
[facility]                                                                               [name] [name], is a 61 y.o. male, DOB - 08/14/1960, UXL:[id number] Admit date - 08/27/2021    Outpatient Primary MD for the patient is [facility]  LOS - 7  days    Brief summary   61 year old male with past medical history of CAD, HTN, HLD, obesity, OSA, prostate cancer presented to the hospital with complaints of fever and chills secondary to perforated sigmoid diverticulitis and liver abscess. Patient underwent Image guided drain placement in the liver and pelvic drain. General surgery on board. ID consulted for persistent fevers.  Fever curve improving. Repeat CT abdomen and pelvis  shows improvement in the liver abscesses and pelvic abscess. 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 Cultures this     Assessment & Plan    Assessment and Plan:  Acute perforated sigmoid diverticulitis with large complex diverticular abscess:  IR consulted and he underwent 2 drains placed. Cultures from the abscess growing E coli.  Patient has been on IV zosyn for the last 4 days, but continues to spike fevers, with worsening leukocytosis.  Repeat blood cultures done and negative.  cxr does not show any new pneumonia.  ID consulted and antibiotics changed to Unasyn.  Cultures from the abscess growing  rare E coli, Rare Candida albicans, moderate bacteroides thetaiotaomicron,  Repeat CT abd and pelvis reviewed with the patient.  Pain control.  Symptomatic management with IV anti emetics.  Advance diet as per General surgery recommendations.  Gen Surgery and ID no board.    Liver abscesses: MRI showing multiple liver lesions concerning for abscesses.  Image guided liver drain placement with near resolution of the abscess. Other abscesses improving.     Ascending aortic aneurysm:  Recommend outpatient follow up annually.    Chronic lower extremity  edema:  Recommend ambulation.  Venous duplex is negative for DVT.     Enterococcus UTI:  Treated.    Body mass index is 37.27 kg/m. Poor prognostic factor.    OSA:  On CPAP.    Hypertension:  BP parameters are well controlled.    Headache resolved.    Mild hyponatremia:  From fluid overload.    Mild normocytic anemia.  Hemoglobin stable around 11.   Gout flare up:  He will be started on Colchicine 0.6 mg BID for 3 days followed by daily.       Estimated body mass index is 37.27 kg/m as calculated from the following:   Height as of this encounter: 5' 10.5" (1.791 m).   Weight as of this encounter: 119.5 kg.  Code Status: full code.  DVT Prophylaxis:  Place TED hose Start: 08/31/21 0838 heparin injection 5,000 Units Start: 08/29/21 2200   Level of Care: Level of care: Telemetry Medical Family Communication: family at bedside.   Disposition Plan:     Remains inpatient appropriate:  IV antibiotics, perforated sigmoid diverticulitis.   Procedures:  CT abd pelvis showing . Interval percutaneous pigtail drain placement within the anterior segment 3 left liver abscess, with no residual  measurable abscess in this location. The numerous other liver abscesses are stable to slightly decreased.  Interval percutaneous drain placement within the collection adherent to the anterior left bladder, which has nearly resolved, currently 1.5 x 1.1 cm with internal gas. Persistent gas within a soft tissue tract connecting the mid sigmoid colon to the bladder suspicious for colovesical fistula. Left anterior bladder wall thickening is improved. Persistent gas in the nondependent bladder  Consultants:   General surgery  ID.  IR   Antimicrobials:   Anti-infectives (From admission, onward)    Start     Dose/Rate Route Frequency Ordered Stop   09/01/21 2000  Ampicillin-Sulbactam (UNASYN) 3 g in sodium chloride 0.9 % 100 mL IVPB        3 g 200 mL/hr over 30 Minutes  Intravenous Every 6 hours 09/01/21 1859     08/29/21 1200  piperacillin-tazobactam (ZOSYN) IVPB 3.375 g  Status:  Discontinued        3.375 g 12.5 mL/hr over 240 Minutes Intravenous Every 8 hours 08/29/21 0928 09/01/21 1820   08/28/21 0400  ceFEPIme (MAXIPIME) 2 g in sodium chloride 0.9 % 100 mL IVPB  Status:  Discontinued        2 g 200 mL/hr over 30 Minutes Intravenous Every 8 hours 08/27/21 2103 08/29/21 0844   08/27/21 2145  metroNIDAZOLE (FLAGYL) IVPB 500 mg  Status:  Discontinued        500 mg 100 mL/hr over 60 Minutes Intravenous Every 12 hours 08/27/21 2142 08/29/21 0844   08/27/21 1730  ceFEPIme (MAXIPIME) 2 g in sodium chloride 0.9 % 100 mL IVPB        2 g 200 mL/hr over 30 Minutes Intravenous  Once 08/27/21 1721 08/27/21 1948   08/27/21 1730  metroNIDAZOLE (FLAGYL) IVPB 500 mg        500 mg 100 mL/hr over 60 Minutes Intravenous  Once 08/27/21 1721 08/27/21 2110        Medications  Scheduled Meds:  allopurinol  100 mg Oral Daily   benzonatate  100 mg Oral TID   colchicine  0.6 mg Oral BID   dextromethorphan-guaiFENesin  1 tablet Oral BID   fluticasone  2 spray Each Nare Daily   furosemide  40 mg Oral Daily   heparin  5,000 Units Subcutaneous Q8H   metoprolol succinate  25 mg Oral Daily   pantoprazole (PROTONIX) IV  40 mg Intravenous Q24H   saccharomyces boulardii  250 mg Oral BID   sodium chloride flush  5 mL Intracatheter Q8H   Continuous Infusions:  ampicillin-sulbactam (UNASYN) IV 3 g (09/03/21 0817)   PRN Meds:.albuterol, butalbital-acetaminophen-caffeine, HYDROmorphone (DILAUDID) injection, ondansetron **OR** ondansetron (ZOFRAN) IV, oxyCODONE, simethicone    Subjective:   [name] [name] was seen and examined today. Left second metacarpophalangeal joint swollen and red and tender.   Objective:   Vitals:   09/02/21 2100 09/02/21 2135 09/03/21 0451 09/03/21 0813  BP:  120/67 123/79 126/67  Pulse:  70 72 67  Resp:  '20 17 18  '$ Temp: (!) 100.5 F (38.1  C) (!) 100.5 F (38.1 C) 98.7 F (37.1 C) 98.7 F (37.1 C)  TempSrc: Oral Oral Oral Oral  SpO2:  96% 97% 98%  Weight:      Height:        Intake/Output Summary (Last 24 hours) at 09/03/2021 1416 Last data filed at 09/03/2021 0942 Gross per 24 hour  Intake 505 ml  Output 1955 ml  Net -1450 ml    [employer]  08/31/21 0758 09/01/21 0412 09/02/21 0426  Weight: 121.4 kg 120.6 kg 119.5 kg     Exam  General exam: Appears calm and comfortable  Respiratory system: Clear to auscultation. Respiratory effort normal. Cardiovascular system: S1 & S2 heard, RRR. No JVD, some pedal edema.  Gastrointestinal system: Abdomen is nondistended, soft and nontender.  Normal bowel sounds heard. Central nervous system: Alert and oriented. No focal neurological deficits. Extremities: Symmetric 5 x 5 power. Skin: redness over the metacarpophalangeal joint.  Psychiatry:  Mood & affect appropriate.      Data Reviewed:  I have personally reviewed following labs and imaging studies   CBC Lab Results  Component Value Date   WBC 13.8 (H) 09/03/2021   RBC 3.61 (L) 09/03/2021   HGB 11.0 (L) 09/03/2021   HCT 33.5 (L) 09/03/2021   MCV 92.8 09/03/2021   MCH 30.5 09/03/2021   PLT 360 09/03/2021   MCHC 32.8 09/03/2021   RDW 13.3 09/03/2021   LYMPHSABS 1.2 09/03/2021   MONOABS 0.7 09/03/2021   EOSABS 0.0 09/03/2021   BASOSABS 0.1 00/93/8182     Last metabolic panel Lab Results  Component Value Date   NA 133 (L) 09/03/2021   K 4.1 09/03/2021   CL 103 09/03/2021   CO2 20 (L) 09/03/2021   BUN 7 (L) 09/03/2021   CREATININE 1.09 09/03/2021   GLUCOSE 159 (H) 09/03/2021   GFRNONAA >60 09/03/2021   GFRAA 55 (L) 10/30/2019   CALCIUM 8.2 (L) 09/03/2021   PHOS 3.2 09/02/2021   PROT 6.8 09/01/2021   ALBUMIN 2.1 (L) 09/01/2021   BILITOT 0.9 09/01/2021   ALKPHOS 110 09/01/2021   AST 41 09/01/2021   ALT 41 09/01/2021   ANIONGAP 10 09/03/2021    CBG (last 3)  No results for input(s):  "GLUCAP" in the last 72 hours.    Coagulation Profile: Recent Labs  Lab 08/27/21 1824 08/28/21 0300  INR 1.2 1.2      Radiology Studies: CT ABDOMEN PELVIS W CONTRAST  Result Date: 09/02/2021 CLINICAL DATA:  Inpatient. Follow-up perforated diverticulitis with liver abscesses and pelvic abscess status post percutaneous drain placement 5 days prior. EXAM: CT ABDOMEN AND PELVIS WITH CONTRAST TECHNIQUE: Multidetector CT imaging of the abdomen and pelvis was performed using the standard protocol following bolus administration of intravenous contrast. RADIATION DOSE REDUCTION: This exam was performed according to the departmental dose-optimization program which includes automated exposure control, adjustment of the mA and/or kV according to patient size and/or use of iterative reconstruction technique. CONTRAST:  15m OMNIPAQUE IOHEXOL 300 MG/ML  SOLN COMPARISON:  08/28/2021 CT abdomen/pelvis. FINDINGS: Lower chest: Mild platelike bibasilar atelectasis. Coronary atherosclerosis. Hepatobiliary: Numerous (greater than 10) rim enhancing abscesses scattered throughout the liver. Percutaneous pigtail drain terminates in the anterior segment 3 left liver, with no definite residual measurable abscess in this location. A few small bubbles of free air anterior to the left liver are compatible with instrumentation. The other liver abscesses are stable to slightly decreased. Representative lobulated 9.0 x 6.2 cm segment 4 left liver abscess (series 3/image 16), previously 8.7 x 6.6 cm, overall stable. Representative 4.5 x 2.6 cm segment 7 right liver abscess (series 3/image 22), previously 4.6 x 2.9 cm, slightly decreased. Representative 1.3 x 0.9 cm inferior right liver abscess (series 3/image 39), mildly decreased from 1.7 x 1.2 cm. No appreciable new or enlarging liver lesions. Normal gallbladder with no radiopaque cholelithiasis. No biliary ductal dilatation. Pancreas: Normal, with no mass or duct dilation.  Spleen: Normal size. No mass.  Adrenals/Urinary Tract: Normal adrenals. A few scattered punctate 1-2 mm nonobstructing stones throughout both kidneys. No hydronephrosis. No renal masses. Interval percutaneous pigtail drain placement within the collection adherent to the anterior left bladder, which has nearly resolved, currently 1.5 x 1.1 cm with internal gas (series 3/image 86), previously 6.1 x 4.3 cm. Foci of gas remain along a fistula tract from the mid sigmoid colon to the bladder. Persistent gas in the nondependent bladder wall. Left anterior bladder wall thickening is improved. Stomach/Bowel: Normal non-distended stomach. Normal caliber small bowel with no small bowel wall thickening. Normal appendix. Oral contrast transits to the rectum. Moderate sigmoid diverticulosis. Similar wall thickening throughout the proximal to mid sigmoid colon with mild pericolonic fat stranding, unchanged. No new sites of large bowel wall thickening. Vascular/Lymphatic: Atherosclerotic nonaneurysmal abdominal aorta. Patent portal, splenic and renal veins. Mildly enlarged 1.0 cm porta hepatis node (series 3/image 32), stable. No additional pathologically enlarged lymph nodes in the abdomen or pelvis. Reproductive: Apparent prostatectomy. Other: Similar small volume ascites in anterior pelvis. No new fluid collections. Musculoskeletal: No aggressive appearing focal osseous lesions. 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 Moderate degenerative disc disease throughout the remaining visualized thoracolumbar spine. IMPRESSION: 1. Interval percutaneous pigtail drain placement within the anterior segment 3 left liver abscess, with no residual measurable abscess in this location. The numerous other liver abscesses are stable to slightly decreased. 2. Interval percutaneous drain placement within the collection adherent to the anterior left bladder, which has nearly  resolved, currently 1.5 x 1.1 cm with internal gas. Persistent gas within a soft tissue tract connecting the mid sigmoid colon to the bladder suspicious for colovesical fistula. Left anterior bladder wall thickening is improved. Persistent gas in the nondependent bladder. 3. Stable mild porta hepatis adenopathy, nonspecific, probably reactive. 4. Nonobstructing bilateral nephrolithiasis. 5. 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 6. Aortic Atherosclerosis (ICD10-I70.0). Electronically Signed   By: Ilona Sorrel M.D.   On: 09/02/2021 14:29       Hosie Poisson M.D. [facility] 09/03/2021, 2:16 PM  Available via Epic secure chat 7am-7pm After 7 pm, please refer to night coverage provider listed on amion.

## 2021-09-03 NOTE — Progress Notes (Addendum)
Subjective: CC: Complains of a HA this am, received Fioricet. Also notes gout in left hand. No abdominal pain presently. Will occassionally get "gas pains" in his left abdomen and suprapubic abdomen that are sharp and last for ~30 seconds. Completely resolve after episodes of flatus. No RUQ pain. Tolerating cld without increased abdominal pain, n/v. Small semi-solid bm this am. No dysuria, pneumaturia or flecks of stool in his urine. No pain meds yesterday.   Objective: Vital signs in last 24 hours: Temp:  [98.5 F (36.9 C)-100.5 F (38.1 C)] 98.7 F (37.1 C) (07/17 0813) Pulse Rate:  [64-72] 67 (07/17 0813) Resp:  [17-20] 18 (07/17 0813) BP: (120-126)/(67-79) 126/67 (07/17 0813) SpO2:  [96 %-98 %] 98 % (07/17 0813) Last BM Date : 09/01/21  Intake/Output from previous day: 07/16 0701 - 07/17 0700 In: 265 [P.O.:240; I.V.:5] Out: 2170 [Urine:2100; Drains:70] Intake/Output this shift: No intake/output data recorded.  PE: Gen:  Alert, NAD, pleasant, sitting up in chair Pulm: Rate and effort normal Abd: Soft, ND, he is completely NT on my exam, small umbilical hernia that is reducible, +BS,  Upper IR drain - 5cc/24 hours, SS Inferior IR drain - 65cc/24 hours, bile tinged Psych: A&Ox3    Lab Results:  Recent Labs    09/02/21 0929 09/03/21 0121  WBC 13.2* 13.8*  HGB 11.3* 11.0*  HCT 34.2* 33.5*  PLT 367 360   BMET Recent Labs    09/02/21 0929 09/03/21 0121  NA 135 133*  K 4.1 4.1  CL 102 103  CO2 22 20*  GLUCOSE 128* 159*  BUN 9 7*  CREATININE 1.03 1.09  CALCIUM 8.2* 8.2*   PT/INR No results for input(s): "LABPROT", "INR" in the last 72 hours. CMP     Component Value Date/Time   NA 133 (L) 09/03/2021 0121   K 4.1 09/03/2021 0121   CL 103 09/03/2021 0121   CO2 20 (L) 09/03/2021 0121   GLUCOSE 159 (H) 09/03/2021 0121   BUN 7 (L) 09/03/2021 0121   CREATININE 1.09 09/03/2021 0121   CALCIUM 8.2 (L) 09/03/2021 0121   PROT 6.8 09/01/2021 0106    ALBUMIN 2.1 (L) 09/01/2021 0106   AST 41 09/01/2021 0106   ALT 41 09/01/2021 0106   ALKPHOS 110 09/01/2021 0106   BILITOT 0.9 09/01/2021 0106   GFRNONAA >60 09/03/2021 0121   GFRAA 55 (L) 10/30/2019 1747   Lipase  No results found for: "LIPASE"  Studies/Results: CT ABDOMEN PELVIS W CONTRAST  Result Date: 09/02/2021 CLINICAL DATA:  Inpatient. Follow-up perforated diverticulitis with liver abscesses and pelvic abscess status post percutaneous drain placement 5 days prior. EXAM: CT ABDOMEN AND PELVIS WITH CONTRAST TECHNIQUE: Multidetector CT imaging of the abdomen and pelvis was performed using the standard protocol following bolus administration of intravenous contrast. RADIATION DOSE REDUCTION: This exam was performed according to the departmental dose-optimization program which includes automated exposure control, adjustment of the mA and/or kV according to patient size and/or use of iterative reconstruction technique. CONTRAST:  135m OMNIPAQUE IOHEXOL 300 MG/ML  SOLN COMPARISON:  08/28/2021 CT abdomen/pelvis. FINDINGS: Lower chest: Mild platelike bibasilar atelectasis. Coronary atherosclerosis. Hepatobiliary: Numerous (greater than 10) rim enhancing abscesses scattered throughout the liver. Percutaneous pigtail drain terminates in the anterior segment 3 left liver, with no definite residual measurable abscess in this location. A few small bubbles of free air anterior to the left liver are compatible with instrumentation. The other liver abscesses are stable to slightly decreased. Representative lobulated 9.0 x  6.2 cm segment 4 left liver abscess (series 3/image 16), previously 8.7 x 6.6 cm, overall stable. Representative 4.5 x 2.6 cm segment 7 right liver abscess (series 3/image 22), previously 4.6 x 2.9 cm, slightly decreased. Representative 1.3 x 0.9 cm inferior right liver abscess (series 3/image 39), mildly decreased from 1.7 x 1.2 cm. No appreciable new or enlarging liver lesions. Normal  gallbladder with no radiopaque cholelithiasis. No biliary ductal dilatation. Pancreas: Normal, with no mass or duct dilation. Spleen: Normal size. No mass. Adrenals/Urinary Tract: Normal adrenals. A few scattered punctate 1-2 mm nonobstructing stones throughout both kidneys. No hydronephrosis. No renal masses. Interval percutaneous pigtail drain placement within the collection adherent to the anterior left bladder, which has nearly resolved, currently 1.5 x 1.1 cm with internal gas (series 3/image 86), previously 6.1 x 4.3 cm. Foci of gas remain along a fistula tract from the mid sigmoid colon to the bladder. Persistent gas in the nondependent bladder wall. Left anterior bladder wall thickening is improved. Stomach/Bowel: Normal non-distended stomach. Normal caliber small bowel with no small bowel wall thickening. Normal appendix. Oral contrast transits to the rectum. Moderate sigmoid diverticulosis. Similar wall thickening throughout the proximal to mid sigmoid colon with mild pericolonic fat stranding, unchanged. No new sites of large bowel wall thickening. Vascular/Lymphatic: Atherosclerotic nonaneurysmal abdominal aorta. Patent portal, splenic and renal veins. Mildly enlarged 1.0 cm porta hepatis node (series 3/image 32), stable. No additional pathologically enlarged lymph nodes in the abdomen or pelvis. Reproductive: Apparent prostatectomy. Other: Similar small volume ascites in anterior pelvis. No new fluid collections. Musculoskeletal: No aggressive appearing focal osseous lesions. 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 Moderate degenerative disc disease throughout the remaining visualized thoracolumbar spine. IMPRESSION: 1. Interval percutaneous pigtail drain placement within the anterior segment 3 left liver abscess, with no residual measurable abscess in this location. The numerous other liver abscesses are stable to slightly decreased.  2. Interval percutaneous drain placement within the collection adherent to the anterior left bladder, which has nearly resolved, currently 1.5 x 1.1 cm with internal gas. Persistent gas within a soft tissue tract connecting the mid sigmoid colon to the bladder suspicious for colovesical fistula. Left anterior bladder wall thickening is improved. Persistent gas in the nondependent bladder. 3. Stable mild porta hepatis adenopathy, nonspecific, probably reactive. 4. Nonobstructing bilateral nephrolithiasis. 5. 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 6. Aortic Atherosclerosis (ICD10-I70.0). Electronically Signed   By: Ilona Sorrel M.D.   On: 09/02/2021 14:29    Anti-infectives: Anti-infectives (From admission, onward)    Start     Dose/Rate Route Frequency Ordered Stop   09/01/21 2000  Ampicillin-Sulbactam (UNASYN) 3 g in sodium chloride 0.9 % 100 mL IVPB        3 g 200 mL/hr over 30 Minutes Intravenous Every 6 hours 09/01/21 1859     08/29/21 1200  piperacillin-tazobactam (ZOSYN) IVPB 3.375 g  Status:  Discontinued        3.375 g 12.5 mL/hr over 240 Minutes Intravenous Every 8 hours 08/29/21 0928 09/01/21 1820   08/28/21 0400  ceFEPIme (MAXIPIME) 2 g in sodium chloride 0.9 % 100 mL IVPB  Status:  Discontinued        2 g 200 mL/hr over 30 Minutes Intravenous Every 8 hours 08/27/21 2103 08/29/21 0844   08/27/21 2145  metroNIDAZOLE (FLAGYL) IVPB 500 mg  Status:  Discontinued  500 mg 100 mL/hr over 60 Minutes Intravenous Every 12 hours 08/27/21 2142 08/29/21 0844   08/27/21 1730  ceFEPIme (MAXIPIME) 2 g in sodium chloride 0.9 % 100 mL IVPB        2 g 200 mL/hr over 30 Minutes Intravenous  Once 08/27/21 1721 08/27/21 1948   08/27/21 1730  metroNIDAZOLE (FLAGYL) IVPB 500 mg        500 mg 100 mL/hr over 60 Minutes Intravenous  Once 08/27/21 1721 08/27/21 2110        Assessment/Plan Diverticulitis with abscess and liver  lesions abscess vs necrotic tumor. 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 - S/p percutaneous drainage of pelvic abscess and one of the liver abscesses. Liver cultures are negative to date, output very scant and no residual fluid collection noted on repeat CT where drain placed in liver abscess is located - I have message IR to see if this drain can be removed. Cx's from pelvic abscess w/ rare E. Coli, candida albicans, moderate bacteroides thetaiotaomicron, and beta lactamase positive. ID following and has patient on Unasyn. Query if needs antifungal coverage given Candida seen on Cx - I have message ID. - Patient w/ persistent fevers over the weekend. Repeat CT 7/16 w/ perc drain in the pelvic fluid collection that has nearly resolved now measuring 1.5 x 1.1 (previously 6.1 x 4.8 x 4.3 cm). There are still continued liver abscesses that are stable to slightly decreased. Suspect this is the cause of patients persistent fevers.  - Cont IV abx, inquiring with ID their thoughts on adding antifungal coverage based on cx results - I do not think that he needs emergency surgery for his diverticulitis at this time. Will see how he does with diet advancement and place on FLD today. Continue to trend labs. Suspect he will likely need resective surgery at some point but hopefully that will be an elective procedure rather than emergency surgery during admission.  - I spoke with patients with on speaker phone and updated her on plan.   FEN - FLD, IVF per [facility] VTE - SCDs, subq heparin ID - Currently on Unasyn. Fever curve improved. Tmax 100.5/24 hours - afebrile this am. No tachycardia or hypotension. WBC overall stable at 13.8.   Recent UTI tx with Fosfomycin  AAA OSA HTN  I reviewed nursing notes, Consultant ID notes, hospitalist notes, last 24 h vitals and pain scores, last 48 h intake and output, last 24 h labs and trends, and last 24 h imaging results.   LOS: 7 days    Jillyn Ledger , [facility] 09/03/2021, 8:41 AM Please see Amion for pager number during day hours 7:00am-4:30pm

## 2021-09-03 NOTE — Progress Notes (Signed)
Patient wants to start his allopurinol  stated I have gout to my left index knuckles , swollen and tender.On call provider informed.

## 2021-09-03 NOTE — Progress Notes (Signed)
[facility] for Infectious Disease   Reason for visit: Follow up on intra abdominal abscess  Interval History: culture growth with E coli, Bacteroides, Candida albicans  Tmax 100.5; WBC 13.8.   Day 8 total antibiotics  Physical Exam: Constitutional:  Vitals:   09/03/21 0813 09/03/21 1641  BP: 126/67 123/72  Pulse: 67 79  Resp: 18 18  Temp: 98.7 F (37.1 C) 99.5 F (37.5 C)  SpO2: 98% 97%   patient appears in NAD Respiratory: Normal respiratory effort; CTA B  Review of Systems: Constitutional: negative for fevers and chills  Lab Results  Component Value Date   WBC 13.8 (H) 09/03/2021   HGB 11.0 (L) 09/03/2021   HCT 33.5 (L) 09/03/2021   MCV 92.8 09/03/2021   PLT 360 09/03/2021    Lab Results  Component Value Date   CREATININE 1.09 09/03/2021   BUN 7 (L) 09/03/2021   NA 133 (L) 09/03/2021   K 4.1 09/03/2021   CL 103 09/03/2021   CO2 20 (L) 09/03/2021    Lab Results  Component Value Date   ALT 41 09/01/2021   AST 41 09/01/2021   ALKPHOS 110 09/01/2021     Microbiology: Recent Results (from the past 240 hour(s))  Urine Culture     Status: None   Collection Time: 08/27/21  3:48 PM   Specimen: In/Out Cath Urine  Result Value Ref Range Status   Specimen Description IN/OUT CATH URINE  Final   Special Requests NONE  Final   Culture   Final    NO GROWTH Performed at [facility], [address], [place], [place] [address]    Report Status 08/28/2021 FINAL  Final  Blood Culture (routine x 2)     Status: None   Collection Time: 08/27/21  4:22 PM   Specimen: BLOOD  Result Value Ref Range Status   Specimen Description BLOOD BLOOD LEFT FOREARM  Final   Special Requests   Final    BOTTLES DRAWN AEROBIC AND ANAEROBIC Blood Culture results may not be optimal due to an inadequate volume of blood received in culture bottles   Culture   Final    NO GROWTH 5 DAYS Performed at [facility], [place] [address] [place], [place] [address]    Report  Status 09/01/2021 FINAL  Final  Blood Culture (routine x 2)     Status: None   Collection Time: 08/27/21  6:24 PM   Specimen: BLOOD RIGHT FOREARM  Result Value Ref Range Status   Specimen Description BLOOD RIGHT FOREARM  Final   Special Requests   Final    BOTTLES DRAWN AEROBIC AND ANAEROBIC Blood Culture results may not be optimal due to an inadequate volume of blood received in culture bottles   Culture   Final    NO GROWTH 5 DAYS Performed at [facility], [place] [address] [place],  [address]    Report Status 09/01/2021 FINAL  Final  Respiratory (~20 pathogens) panel by PCR     Status: None   Collection Time: 08/28/21  3:42 AM   Specimen: Nasopharyngeal Swab; Respiratory  Result Value Ref Range Status   Adenovirus NOT DETECTED NOT DETECTED Final   Coronavirus 229E NOT DETECTED NOT DETECTED Final    Comment: (NOTE) The Coronavirus on the Respiratory Panel, DOES NOT test for the novel  Coronavirus (2019 nCoV)    Coronavirus HKU1 NOT DETECTED NOT DETECTED Final   Coronavirus NL63 NOT DETECTED NOT DETECTED Final  Coronavirus OC43 NOT DETECTED NOT DETECTED Final   Metapneumovirus NOT DETECTED NOT DETECTED Final   Rhinovirus / Enterovirus NOT DETECTED NOT DETECTED Final   Influenza A NOT DETECTED NOT DETECTED Final   Influenza B NOT DETECTED NOT DETECTED Final   Parainfluenza Virus 1 NOT DETECTED NOT DETECTED Final   Parainfluenza Virus 2 NOT DETECTED NOT DETECTED Final   Parainfluenza Virus 3 NOT DETECTED NOT DETECTED Final   Parainfluenza Virus 4 NOT DETECTED NOT DETECTED Final   Respiratory Syncytial Virus NOT DETECTED NOT DETECTED Final   Bordetella pertussis NOT DETECTED NOT DETECTED Final   Bordetella Parapertussis NOT DETECTED NOT DETECTED Final   Chlamydophila pneumoniae NOT DETECTED NOT DETECTED Final   Mycoplasma pneumoniae NOT DETECTED NOT DETECTED Final    Comment: Performed at [facility], [place] [address] [place], [place] [address]   Aerobic/Anaerobic Culture w Gram Stain (surgical/deep wound)     Status: None   Collection Time: 08/28/21  4:14 PM   Specimen: Abscess  Result Value Ref Range Status   Specimen Description ABSCESS  Final   Special Requests ABDOMEN IN PELVIS, SAMPLE 1  Final   Gram Stain   Final    ABUNDANT WBC PRESENT,BOTH PMN AND MONONUCLEAR FEW GRAM NEGATIVE RODS    Culture   Final    RARE ESCHERICHIA COLI RARE CANDIDA ALBICANS MODERATE BACTEROIDES THETAIOTAOMICRON BETA LACTAMASE POSITIVE Performed at [facility], [place] [address] [place], [place] [address]    Report Status 09/01/2021 FINAL  Final   Organism ID, Bacteria ESCHERICHIA COLI  Final      Susceptibility   Escherichia coli - MIC*    AMPICILLIN <=2 SENSITIVE Sensitive     CEFAZOLIN <=4 SENSITIVE Sensitive     CEFEPIME <=0.12 SENSITIVE Sensitive     CEFTAZIDIME <=1 SENSITIVE Sensitive     CEFTRIAXONE <=0.25 SENSITIVE Sensitive     CIPROFLOXACIN <=0.25 SENSITIVE Sensitive     GENTAMICIN <=1 SENSITIVE Sensitive     IMIPENEM <=0.25 SENSITIVE Sensitive     TRIMETH/SULFA <=20 SENSITIVE Sensitive     AMPICILLIN/SULBACTAM <=2 SENSITIVE Sensitive     PIP/TAZO <=4 SENSITIVE Sensitive     * RARE ESCHERICHIA COLI  Aerobic/Anaerobic Culture w Gram Stain (surgical/deep wound)     Status: None   Collection Time: 08/28/21  5:15 PM   Specimen: Abscess  Result Value Ref Range Status   Specimen Description ABSCESS  Final   Special Requests LIVER SAMPLE 2  Final   Gram Stain   Final    ABUNDANT WBC PRESENT, PREDOMINANTLY PMN NO ORGANISMS SEEN    Culture   Final    No growth aerobically or anaerobically. Performed at [facility], [place] [address], [place], [place] [address]    Report Status 09/02/2021 FINAL  Final  Culture, blood (Routine X 2) w Reflex to ID Panel     Status: None   Collection Time: 08/29/21  6:47 PM   Specimen: BLOOD  Result Value Ref Range Status   Specimen Description BLOOD BLOOD RIGHT HAND  Final   Special  Requests   Final    BOTTLES DRAWN AEROBIC AND ANAEROBIC Blood Culture adequate volume   Culture   Final    NO GROWTH 5 DAYS Performed at [facility], [place] [address] [place], [place] [address]    Report Status 09/03/2021 FINAL  Final  Culture, blood (Routine X 2) w Reflex to ID Panel     Status: None   Collection  Time: 08/29/21  6:48 PM   Specimen: BLOOD  Result Value Ref Range Status   Specimen Description BLOOD LEFT ANTECUBITAL  Final   Special Requests   Final    BOTTLES DRAWN AEROBIC AND ANAEROBIC Blood Culture adequate volume   Culture   Final    NO GROWTH 5 DAYS Performed at [facility], [address] [place], [place] [address]    Report Status 09/03/2021 FINAL  Final  Culture, blood (Routine X 2) w Reflex to ID Panel     Status: None (Preliminary result)   Collection Time: 09/01/21  2:02 PM   Specimen: BLOOD RIGHT HAND  Result Value Ref Range Status   Specimen Description BLOOD RIGHT HAND  Final   Special Requests   Final    BOTTLES DRAWN AEROBIC AND ANAEROBIC Blood Culture results may not be optimal due to an inadequate volume of blood received in culture bottles   Culture   Final    NO GROWTH 2 DAYS Performed at [facility], [place] [address], [place], [place] [address]    Report Status PENDING  Incomplete  Culture, blood (Routine X 2) w Reflex to ID Panel     Status: None (Preliminary result)   Collection Time: 09/01/21  2:02 PM   Specimen: BLOOD RIGHT HAND  Result Value Ref Range Status   Specimen Description BLOOD RIGHT HAND  Final   Special Requests   Final    BOTTLES DRAWN AEROBIC AND ANAEROBIC Blood Culture adequate volume   Culture   Final    NO GROWTH 2 DAYS Performed at [facility], [place] [address], [place], [place] [address]    Report Status PENDING  Incomplete    Impression/Plan:  1. Diverticular abscess and liver abscess with drain placement - draining well and cultures noted.  On ampicillin/sulbactam and will continue.    Will add antifungal coverage with micafungin  2.  Prolonged Qtc - elevated and a repeat EKG with a persistently elevated Qtc.  Makes the use of fluconazole problematic.   Will look at alternatives for oral treatment for discharge and will use IV micafungin while inpatient and avoid fluconazole.    3.  Fever - trending down now and will continue to monitor.

## 2021-09-03 NOTE — Progress Notes (Signed)
Supervising Physician: Juliet Rude  Patient Status:  [facility] - In-pt  Chief Complaint:  Diverticulitis  Subjective:  Today has been a good day per pt. Patient sitting up in chair visiting with wife  Allergies: Patient has no known allergies.  Medications: Prior to Admission medications   Medication Sig Start Date End Date Taking? Authorizing Provider  allopurinol (ZYLOPRIM) 100 MG tablet Take 100 mg by mouth daily.  05/05/19  Yes [provider]  doxycycline (VIBRA-TABS) 100 MG tablet Take 100 mg by mouth 2 (two) times daily. 08/23/21  Yes [provider]  hydrochlorothiazide (HYDRODIURIL) 25 MG tablet TAKE 1 TABLET (25 MG TOTAL) BY MOUTH DAILY. Patient taking differently: Take 25 mg by mouth daily. 01/09/15  Yes Burtis Junes, NP  ibuprofen (ADVIL) 200 MG tablet Take 800 mg by mouth every 6 (six) hours as needed for mild pain.   Yes [provider]  lisinopril (PRINIVIL,ZESTRIL) 20 MG tablet TAKE 1 TABLET BY MOUTH EVERY DAY Patient taking differently: Take 20 mg by mouth daily. 07/04/14  Yes Larey Dresser, MD  meloxicam (MOBIC) 15 MG tablet Take 15 mg by mouth daily as needed for pain. 07/20/21  Yes [provider]  metoprolol succinate (TOPROL-XL) 25 MG 24 hr tablet Take 25 mg by mouth daily. 08/22/21  Yes [provider]  tadalafil (CIALIS) 5 MG tablet Take 5 mg by mouth daily. 08/20/21  Yes [provider]  metoprolol tartrate (LOPRESSOR) 25 MG tablet Take 1 tablet (25 mg total) by mouth 2 (two) times daily. Patient not taking: Reported on 08/27/2021 12/01/13   Larey Dresser, MD  pravastatin (PRAVACHOL) 40 MG tablet Take 40 mg by mouth daily. Patient not taking: Reported on 08/27/2021 08/20/17   [provider]  sulfamethoxazole-trimethoprim (BACTRIM DS) 800-160 MG tablet Take 1 tablet by mouth 2 (two) times daily. Start the day prior to foley removal appointment Patient not taking: Reported on 08/27/2021 09/02/19    Debbrah Alar, PA-C  traMADol (ULTRAM) 50 MG tablet Take 1-2 tablets (50-100 mg total) by mouth every 6 (six) hours as needed for moderate pain or severe pain. Patient not taking: Reported on 08/27/2021 09/02/19   Debbrah Alar, PA-C     Vital Signs: BP 126/67 (BP Location: Left Arm)   Pulse 67   Temp 98.7 F (37.1 C) (Oral)   Resp 18   Ht 5' 10.5" (1.791 m)   Wt 263 lb 7.2 oz (119.5 kg)   SpO2 98%   BMI 37.27 kg/m   Physical Exam Vitals reviewed.  Constitutional:      General: He is not in acute distress.    Appearance: He is not ill-appearing.  HENT:     Head: Normocephalic and atraumatic.     Mouth/Throat:     Pharynx: Oropharynx is clear.  Eyes:     Extraocular Movements: Extraocular movements intact.     Conjunctiva/sclera: Conjunctivae normal.  Pulmonary:     Effort: Pulmonary effort is normal. No respiratory distress.  Skin:    General: Skin is warm and dry.  Neurological:     General: No focal deficit present.     Mental Status: He is alert and oriented to person, place, and time.  Psychiatric:        Mood and Affect: Mood normal.        Behavior: Behavior normal.   Drain Location: RUQ/LLQ Size: Fr size: 12 Fr Date of placement: 08/28/21 Currently to: Drain collection device: suction bulb 24  hour output:  Output by Drain (mL) 09/01/21 0700 - 09/01/21 1459 09/01/21 1500 - 09/01/21 2259 09/01/21 2300 - 09/02/21 0659 09/02/21 0700 - 09/02/21 1459 09/02/21 1500 - 09/02/21 2259 09/02/21 2300 - 09/03/21 0659 09/03/21 0700 - 09/03/21 1459 09/03/21 1500 - 09/03/21 1601  Closed System Drain 1 Medial;Inferior Abdomen Bulb (JP) 10 Fr.  100 38 '15 20 30    '$ Closed System Drain 2 LUQ Bulb (JP) 12 Fr.   1 0 5 0      Current examination: Flushes/aspirates easily.  Insertion site unremarkable. Suture and stat lock in place. Dressed appropriately.     Imaging: CT ABDOMEN PELVIS W CONTRAST  Result Date: 09/02/2021 CLINICAL DATA:  Inpatient. Follow-up perforated  diverticulitis with liver abscesses and pelvic abscess status post percutaneous drain placement 5 days prior. EXAM: CT ABDOMEN AND PELVIS WITH CONTRAST TECHNIQUE: Multidetector CT imaging of the abdomen and pelvis was performed using the standard protocol following bolus administration of intravenous contrast. RADIATION DOSE REDUCTION: This exam was performed according to the departmental dose-optimization program which includes automated exposure control, adjustment of the mA and/or kV according to patient size and/or use of iterative reconstruction technique. CONTRAST:  173m OMNIPAQUE IOHEXOL 300 MG/ML  SOLN COMPARISON:  08/28/2021 CT abdomen/pelvis. FINDINGS: Lower chest: Mild platelike bibasilar atelectasis. Coronary atherosclerosis. Hepatobiliary: Numerous (greater than 10) rim enhancing abscesses scattered throughout the liver. Percutaneous pigtail drain terminates in the anterior segment 3 left liver, with no definite residual measurable abscess in this location. A few small bubbles of free air anterior to the left liver are compatible with instrumentation. The other liver abscesses are stable to slightly decreased. Representative lobulated 9.0 x 6.2 cm segment 4 left liver abscess (series 3/image 16), previously 8.7 x 6.6 cm, overall stable. Representative 4.5 x 2.6 cm segment 7 right liver abscess (series 3/image 22), previously 4.6 x 2.9 cm, slightly decreased. Representative 1.3 x 0.9 cm inferior right liver abscess (series 3/image 39), mildly decreased from 1.7 x 1.2 cm. No appreciable new or enlarging liver lesions. Normal gallbladder with no radiopaque cholelithiasis. No biliary ductal dilatation. Pancreas: Normal, with no mass or duct dilation. Spleen: Normal size. No mass. Adrenals/Urinary Tract: Normal adrenals. A few scattered punctate 1-2 mm nonobstructing stones throughout both kidneys. No hydronephrosis. No renal masses. Interval percutaneous pigtail drain placement within the collection  adherent to the anterior left bladder, which has nearly resolved, currently 1.5 x 1.1 cm with internal gas (series 3/image 86), previously 6.1 x 4.3 cm. Foci of gas remain along a fistula tract from the mid sigmoid colon to the bladder. Persistent gas in the nondependent bladder wall. Left anterior bladder wall thickening is improved. Stomach/Bowel: Normal non-distended stomach. Normal caliber small bowel with no small bowel wall thickening. Normal appendix. Oral contrast transits to the rectum. Moderate sigmoid diverticulosis. Similar wall thickening throughout the proximal to mid sigmoid colon with mild pericolonic fat stranding, unchanged. No new sites of large bowel wall thickening. Vascular/Lymphatic: Atherosclerotic nonaneurysmal abdominal aorta. Patent portal, splenic and renal veins. Mildly enlarged 1.0 cm porta hepatis node (series 3/image 32), stable. No additional pathologically enlarged lymph nodes in the abdomen or pelvis. Reproductive: Apparent prostatectomy. Other: Similar small volume ascites in anterior pelvis. No new fluid collections. Musculoskeletal: No aggressive appearing focal osseous lesions. 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 Moderate degenerative disc disease throughout the remaining visualized thoracolumbar spine. IMPRESSION: 1. Interval percutaneous pigtail drain placement within the anterior segment 3 left liver  abscess, with no residual measurable abscess in this location. The numerous other liver abscesses are stable to slightly decreased. 2. Interval percutaneous drain placement within the collection adherent to the anterior left bladder, which has nearly resolved, currently 1.5 x 1.1 cm with internal gas. Persistent gas within a soft tissue tract connecting the mid sigmoid colon to the bladder suspicious for colovesical fistula. Left anterior bladder wall thickening is improved. Persistent gas in the  nondependent bladder. 3. Stable mild porta hepatis adenopathy, nonspecific, probably reactive. 4. Nonobstructing bilateral nephrolithiasis. 5. 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 6. Aortic Atherosclerosis (ICD10-I70.0). Electronically Signed   By: Ilona Sorrel M.D.   On: 09/02/2021 14:29   DG CHEST PORT 1 VIEW  Result Date: 08/31/2021 CLINICAL DATA:  Ongoing fever and productive cough for 16 days EXAM: PORTABLE CHEST - 1 VIEW COMPARISON:  08/27/2021 FINDINGS: Cardiomediastinal silhouette and pulmonary vasculature are within normal limits. Lungs are clear. Moderate right glenohumeral osteoarthrosis. IMPRESSION: No acute cardiopulmonary process. Electronically Signed   By: Miachel Roux M.D.   On: 08/31/2021 09:29    Labs:  CBC: Recent Labs    08/31/21 0113 09/01/21 0106 09/02/21 0929 09/03/21 0121  WBC 12.7* 11.9* 13.2* 13.8*  HGB 11.1* 11.0* 11.3* 11.0*  HCT 33.2* 32.7* 34.2* 33.5*  PLT 325 314 367 360    COAGS: Recent Labs    08/27/21 1824 08/28/21 0300  INR 1.2 1.2  APTT 28  --     BMP: Recent Labs    08/31/21 0113 09/01/21 0106 09/02/21 0929 09/03/21 0121  NA 133* 133* 135 133*  K 4.0 3.8 4.1 4.1  CL 101 101 102 103  CO2 22 21* 22 20*  GLUCOSE 136* 139* 128* 159*  BUN '15 12 9 '$ 7*  CALCIUM 8.0* 8.2* 8.2* 8.2*  CREATININE 1.63* 1.21 1.03 1.09  GFRNONAA 47* >60 >60 >60    LIVER FUNCTION TESTS: Recent Labs    08/29/21 0236 08/30/21 0101 08/31/21 0113 09/01/21 0106  BILITOT 1.4* 1.3* 0.9 0.9  AST 46* 45* 33 41  ALT 67* 54* 42 41  ALKPHOS 140* 137* 123 110  PROT 6.8 7.3 7.2 6.8  ALBUMIN 2.1* 2.1* 2.0* 2.1*    Assessment and Plan:  Diverticulitis with abscess and liver lesions abscess --liver lesion resolution and drain with little OP, drain removed -- diverticular abscess with decrease in size per CT, still draining well with green feculent liquid  Continue TID flushes with 5 cc  NS. Record output Q shift. Dressing changes QD or PRN if soiled.   Call IR APP or on call IR MD if difficulty flushing or sudden change in drain output.  Repeat imaging/possible drain injection once output < 10 mL/QD (excluding flush material). Consideration for drain removal if output is < 10 mL/QD (excluding flush material), pending discussion with the providing surgical service.  Discharge planning: Please contact IR APP or on call IR MD prior to patient d/c to ensure appropriate follow up plans are in place. Typically patient will follow up with IR clinic 10-14 days post d/c for repeat imaging/possible drain injection. IR scheduler will contact patient with date/time of appointment. Patient will need to flush drain QD with 5 cc NS, record output QD, dressing changes every 2-3 days or earlier if soiled.   IR will continue to follow - please call with questions or concerns.   Electronically Signed: Pasty Spillers, PA 09/03/2021, 3:50 PM   I spent a  total of 15 Minutes at the the patient's bedside AND on the patient's hospital floor or unit, greater than 50% of which was counseling/coordinating care for drain management

## 2021-09-04 ENCOUNTER — Telehealth (HOSPITAL_COMMUNITY): Payer: Self-pay | Admitting: Pharmacy Technician

## 2021-09-04 ENCOUNTER — Other Ambulatory Visit: Payer: Self-pay | Admitting: Physician Assistant

## 2021-09-04 ENCOUNTER — Other Ambulatory Visit (HOSPITAL_COMMUNITY): Payer: Self-pay

## 2021-09-04 DIAGNOSIS — E785 Hyperlipidemia, unspecified: Secondary | ICD-10-CM | POA: Diagnosis not present

## 2021-09-04 DIAGNOSIS — K5780 Diverticulitis of intestine, part unspecified, with perforation and abscess without bleeding: Secondary | ICD-10-CM | POA: Diagnosis not present

## 2021-09-04 LAB — BASIC METABOLIC PANEL
Anion gap: 8 (ref 5–15)
BUN: 8 mg/dL (ref 8–23)
CO2: 22 mmol/L (ref 22–32)
Calcium: 8.2 mg/dL — ABNORMAL LOW (ref 8.9–10.3)
Chloride: 104 mmol/L (ref 98–111)
Creatinine, Ser: 1.09 mg/dL (ref 0.61–1.24)
GFR, Estimated: >60 mL/min (ref >60)
Glucose, Bld: 122 mg/dL — ABNORMAL HIGH (ref 70–99)
Potassium: 4.1 mmol/L (ref 3.5–5.1)
Sodium: 134 mmol/L — ABNORMAL LOW (ref 135–145)

## 2021-09-04 LAB — CBC WITH DIFFERENTIAL/PLATELET
Abs Immature Granulocytes: 0.09 10*3/uL — ABNORMAL HIGH (ref 0.00–0.07)
Basophils Absolute: 0 10*3/uL (ref 0.0–0.1)
Basophils Relative: 0 %
Eosinophils Absolute: 0 10*3/uL (ref 0.0–0.5)
Eosinophils Relative: 0 %
HCT: 32.8 % — ABNORMAL LOW (ref 39.0–52.0)
Hemoglobin: 11 g/dL — ABNORMAL LOW (ref 13.0–17.0)
Immature Granulocytes: 1 %
Lymphocytes Relative: 12 %
Lymphs Abs: 1.4 10*3/uL (ref 0.7–4.0)
MCH: 31.1 pg (ref 26.0–34.0)
MCHC: 33.5 g/dL (ref 30.0–36.0)
MCV: 92.7 fL (ref 80.0–100.0)
Monocytes Absolute: 0.8 10*3/uL (ref 0.1–1.0)
Monocytes Relative: 7 %
Neutro Abs: 9 10*3/uL — ABNORMAL HIGH (ref 1.7–7.7)
Neutrophils Relative %: 80 %
Platelets: 390 10*3/uL (ref 150–400)
RBC: 3.54 MIL/uL — ABNORMAL LOW (ref 4.22–5.81)
RDW: 13.4 % (ref 11.5–15.5)
WBC: 11.4 10*3/uL — ABNORMAL HIGH (ref 4.0–10.5)
nRBC: 0 % (ref 0.0–0.2)

## 2021-09-04 MED ORDER — PANTOPRAZOLE SODIUM 40 MG PO TBEC
40.0000 mg | DELAYED_RELEASE_TABLET | Freq: Every day | ORAL | Status: DC
  Administered 2021-09-04 – 2021-09-05 (×2): 40 mg via ORAL
  Filled 2021-09-04 (×2): qty 1

## 2021-09-04 NOTE — Progress Notes (Signed)
Referring Physician(s): Michaelle Birks  Supervising Physician: Michaelle Birks  Patient Status:  [facility] - In-pt  Chief Complaint:  F/U Diverticular drain placement  Brief History:  [name] [name] is a 61 y.o. male with medical medical issues including CAD s/p MI s/p stent x2 last 2009, HLD, HTN ,Obesity, OSA, and hx of prostate ca.  He presented to the ED after recommendation from his PCP.  He had undergone workup for 11 days of fever,cough, body aches, poor appetite and generalized malaise.    He was found to have elevated LFTs.  CT scan revealed perforated diverticulitis associated with small fluid collection suggestive of abscess and liver abscess.  He underwent hepatic drain and diverticular drain placement on 08/29/21.  Hepatic drain was removed yesterday.  Subjective:  Very happy he is finally eating solid food.  Allergies: Patient has no known allergies.  Medications: Prior to Admission medications   Medication Sig Start Date End Date Taking? Authorizing Provider  allopurinol (ZYLOPRIM) 100 MG tablet Take 100 mg by mouth daily.  05/05/19  Yes [provider]  doxycycline (VIBRA-TABS) 100 MG tablet Take 100 mg by mouth 2 (two) times daily. 08/23/21  Yes [provider]  hydrochlorothiazide (HYDRODIURIL) 25 MG tablet TAKE 1 TABLET (25 MG TOTAL) BY MOUTH DAILY. Patient taking differently: Take 25 mg by mouth daily. 01/09/15  Yes Burtis Junes, NP  ibuprofen (ADVIL) 200 MG tablet Take 800 mg by mouth every 6 (six) hours as needed for mild pain.   Yes [provider]  lisinopril (PRINIVIL,ZESTRIL) 20 MG tablet TAKE 1 TABLET BY MOUTH EVERY DAY Patient taking differently: Take 20 mg by mouth daily. 07/04/14  Yes Larey Dresser, MD  meloxicam (MOBIC) 15 MG tablet Take 15 mg by mouth daily as needed for pain. 07/20/21  Yes [provider]  metoprolol succinate (TOPROL-XL) 25 MG 24 hr tablet Take 25 mg by mouth daily. 08/22/21  Yes [provider]  tadalafil (CIALIS) 5 MG tablet Take 5 mg by mouth daily. 08/20/21  Yes [provider]  metoprolol tartrate (LOPRESSOR) 25 MG tablet Take 1 tablet (25 mg total) by mouth 2 (two) times daily. Patient not taking: Reported on 08/27/2021 12/01/13   Larey Dresser, MD  pravastatin (PRAVACHOL) 40 MG tablet Take 40 mg by mouth daily. Patient not taking: Reported on 08/27/2021 08/20/17   [provider]  sulfamethoxazole-trimethoprim (BACTRIM DS) 800-160 MG tablet Take 1 tablet by mouth 2 (two) times daily. Start the day prior to foley removal appointment Patient not taking: Reported on 08/27/2021 09/02/19   Debbrah Alar, PA-C  traMADol (ULTRAM) 50 MG tablet Take 1-2 tablets (50-100 mg total) by mouth every 6 (six) hours as needed for moderate pain or severe pain. Patient not taking: Reported on 08/27/2021 09/02/19   Debbrah Alar, PA-C     Vital Signs: BP 131/73 (BP Location: Right Arm)   Pulse 69   Temp 98.8 F (37.1 C) (Oral)   Resp 17   Ht 5' 10.5" (1.791 m)   Wt 263 lb 7.2 oz (119.5 kg)   SpO2 98%   BMI 37.27 kg/m   Physical Exam Constitutional:      Appearance: Normal appearance.  HENT:     Head: Normocephalic and atraumatic.  Cardiovascular:     Rate and Rhythm: Normal rate.  Pulmonary:     Effort: Pulmonary effort is normal. No respiratory distress.  Skin:    General: Skin is warm and dry.  Neurological:  General: No focal deficit present.     Mental Status: He is alert and oriented to person, place, and time.  Psychiatric:        Mood and Affect: Mood normal.        Behavior: Behavior normal.        Thought Content: Thought content normal.        Judgment: Judgment normal.   Drain Location: Lower anterior abdomen Size: Fr size: 10 Fr Date of placement: 08/29/21  Currently to: Drain collection device: suction bulb 24 hour output:  Output by Drain (mL) 09/02/21 0701 - 09/02/21 1900 09/02/21 1901 - 09/03/21 0700 09/03/21 0701 - 09/03/21  1900 09/03/21 1901 - 09/04/21 0700 09/04/21 0701 - 09/04/21 1229  Closed System Drain 1 Medial;Inferior Abdomen Bulb (JP) 10 Fr. 15 50  75   Closed System Drain 2 LUQ Bulb (JP) 12 Fr. 0 5       Current examination: Flushes/aspirates easily.  Fluid is Brownish but seems to be clearing. Insertion site unremarkable. Suture and stat lock in place. Dressed appropriately.    Imaging: CT ABDOMEN PELVIS W CONTRAST  Result Date: 09/02/2021 CLINICAL DATA:  Inpatient. Follow-up perforated diverticulitis with liver abscesses and pelvic abscess status post percutaneous drain placement 5 days prior. EXAM: CT ABDOMEN AND PELVIS WITH CONTRAST TECHNIQUE: Multidetector CT imaging of the abdomen and pelvis was performed using the standard protocol following bolus administration of intravenous contrast. RADIATION DOSE REDUCTION: This exam was performed according to the departmental dose-optimization program which includes automated exposure control, adjustment of the mA and/or kV according to patient size and/or use of iterative reconstruction technique. CONTRAST:  115m OMNIPAQUE IOHEXOL 300 MG/ML  SOLN COMPARISON:  08/28/2021 CT abdomen/pelvis. FINDINGS: Lower chest: Mild platelike bibasilar atelectasis. Coronary atherosclerosis. Hepatobiliary: Numerous (greater than 10) rim enhancing abscesses scattered throughout the liver. Percutaneous pigtail drain terminates in the anterior segment 3 left liver, with no definite residual measurable abscess in this location. A few small bubbles of free air anterior to the left liver are compatible with instrumentation. The other liver abscesses are stable to slightly decreased. Representative lobulated 9.0 x 6.2 cm segment 4 left liver abscess (series 3/image 16), previously 8.7 x 6.6 cm, overall stable. Representative 4.5 x 2.6 cm segment 7 right liver abscess (series 3/image 22), previously 4.6 x 2.9 cm, slightly decreased. Representative 1.3 x 0.9 cm inferior right liver  abscess (series 3/image 39), mildly decreased from 1.7 x 1.2 cm. No appreciable new or enlarging liver lesions. Normal gallbladder with no radiopaque cholelithiasis. No biliary ductal dilatation. Pancreas: Normal, with no mass or duct dilation. Spleen: Normal size. No mass. Adrenals/Urinary Tract: Normal adrenals. A few scattered punctate 1-2 mm nonobstructing stones throughout both kidneys. No hydronephrosis. No renal masses. Interval percutaneous pigtail drain placement within the collection adherent to the anterior left bladder, which has nearly resolved, currently 1.5 x 1.1 cm with internal gas (series 3/image 86), previously 6.1 x 4.3 cm. Foci of gas remain along a fistula tract from the mid sigmoid colon to the bladder. Persistent gas in the nondependent bladder wall. Left anterior bladder wall thickening is improved. Stomach/Bowel: Normal non-distended stomach. Normal caliber small bowel with no small bowel wall thickening. Normal appendix. Oral contrast transits to the rectum. Moderate sigmoid diverticulosis. Similar wall thickening throughout the proximal to mid sigmoid colon with mild pericolonic fat stranding, unchanged. No new sites of large bowel wall thickening. Vascular/Lymphatic: Atherosclerotic nonaneurysmal abdominal aorta. Patent portal, splenic and renal veins. Mildly enlarged 1.0 cm porta hepatis  node (series 3/image 32), stable. No additional pathologically enlarged lymph nodes in the abdomen or pelvis. Reproductive: Apparent prostatectomy. Other: Similar small volume ascites in anterior pelvis. No new fluid collections. Musculoskeletal: No aggressive appearing focal osseous lesions. 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 Moderate degenerative disc disease throughout the remaining visualized thoracolumbar spine. IMPRESSION: 1. Interval percutaneous pigtail drain placement within the anterior segment 3 left liver abscess, with  no residual measurable abscess in this location. The numerous other liver abscesses are stable to slightly decreased. 2. Interval percutaneous drain placement within the collection adherent to the anterior left bladder, which has nearly resolved, currently 1.5 x 1.1 cm with internal gas. Persistent gas within a soft tissue tract connecting the mid sigmoid colon to the bladder suspicious for colovesical fistula. Left anterior bladder wall thickening is improved. Persistent gas in the nondependent bladder. 3. Stable mild porta hepatis adenopathy, nonspecific, probably reactive. 4. Nonobstructing bilateral nephrolithiasis. 5. 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 6. Aortic Atherosclerosis (ICD10-I70.0). Electronically Signed   By: Ilona Sorrel M.D.   On: 09/02/2021 14:29    Labs:  CBC: Recent Labs    09/01/21 0106 09/02/21 0929 09/03/21 0121 09/04/21 0155  WBC 11.9* 13.2* 13.8* 11.4*  HGB 11.0* 11.3* 11.0* 11.0*  HCT 32.7* 34.2* 33.5* 32.8*  PLT 314 367 360 390    COAGS: Recent Labs    08/27/21 1824 08/28/21 0300  INR 1.2 1.2  APTT 28  --     BMP: Recent Labs    09/01/21 0106 09/02/21 0929 09/03/21 0121 09/04/21 0155  NA 133* 135 133* 134*  K 3.8 4.1 4.1 4.1  CL 101 102 103 104  CO2 21* 22 20* 22  GLUCOSE 139* 128* 159* 122*  BUN 12 9 7* 8  CALCIUM 8.2* 8.2* 8.2* 8.2*  CREATININE 1.21 1.03 1.09 1.09  GFRNONAA >60 >60 >60 >60    LIVER FUNCTION TESTS: Recent Labs    08/29/21 0236 08/30/21 0101 08/31/21 0113 09/01/21 0106  BILITOT 1.4* 1.3* 0.9 0.9  AST 46* 45* 33 41  ALT 67* 54* 42 41  ALKPHOS 140* 137* 123 110  PROT 6.8 7.3 7.2 6.8  ALBUMIN 2.1* 2.1* 2.0* 2.1*    Assessment and Plan:  Diverticular abscess - s/p drain placement.   CT scan from 09/02/21 reviewed by Dr. Pascal Lux.  Lower abdominal drain still with 75 mL brownish output. NOT ready for removal anytime soon.  Continue flushes as  ordered with 5 cc NS. Record output Q shift. Dressing changes QD or PRN if soiled.  Call IR APP or on call IR MD if difficulty flushing or sudden change in drain output.  Repeat imaging/possible drain injection once output < 10 mL/QD (excluding flush material). Consideration for drain removal if output is < 10 mL/QD (excluding flush material), pending discussion with the providing surgical service.  He is clinically improving. His WBC is trending down, LFTs improving.  Discharge planning:  *I have entered an order for outpatient drain follow up at the [facility]. He will have a CT scan and a drain injection at that time.  I spent about 20 minutes with patient and his wife explaining the process for drain management and follow up.  Will plan follow up with IR clinic 7-10 days post D/C  IR scheduler will contact patient with date/time of appointment. Patient will need to flush drain QD  with 5 cc NS, record output QD, dressing changes every 2-3 days or earlier if soiled.   IR will continue to follow - please call with questions or concerns.  Electronically Signed: Murrell Redden, PA-C 09/04/2021, 12:28 PM    I spent a total of 25 Minutes at the the patient's bedside AND on the patient's hospital floor or unit, greater than 50% of which was counseling/coordinating care for Drain follow up.

## 2021-09-04 NOTE — Telephone Encounter (Signed)
Pharmacy Patient Advocate Encounter  Insurance verification completed.    The patient is insured through [employer]   The patient is currently admitted and ran test claims for the following: Cresemba 186 mg, Ibrexafungerp Citrate 150 mg tablets.  Copays and coinsurance results were relayed to Inpatient clinical team.

## 2021-09-04 NOTE — TOC Benefit Eligibility Note (Addendum)
Patient Teacher, English as a foreign language completed.    The patient is currently admitted and upon discharge could be taking Cresemba 186 mg.  Requires Prior Authorization  The patient is currently admitted and upon discharge could be taking Ibrexafungerp Citrate 150 mg tablets  Product Not Covered  The patient is insured through [employer] [place], [place] Patient Advocate Specialist [place] Patient Advocate Team Direct Number: [phone]  Fax: [phone]

## 2021-09-04 NOTE — Progress Notes (Signed)
Mobility Specialist Progress Note:   09/04/21 1040  Mobility  Activity Ambulated independently in hallway  Level of Assistance Standby assist, set-up cues, supervision of patient - no hands on  Assistive Device Other (Comment) (IV Pole)  Distance Ambulated (ft) 1100 ft  Activity Response Tolerated well  $Mobility charge 1 Mobility   Pt eager for mobility session. Ambulated two laps around unit with IV pole. Asx throughout. Pt back in chair with all needs met.   Nelta Numbers Acute Rehab Secure Chat or Office Phone: [phone]

## 2021-09-04 NOTE — Telephone Encounter (Signed)
Patient Advocate Encounter  Received notification that the request for prior authorization for Cresemba '186MG'$  capsules  has been denied due to The request does not meet the definition of Medical Necessity found in the member's benefit booklet.       Lyndel Safe, [place] Patient Advocate Specialist [place] Patient Advocate Team Direct Number: [phone]  Fax: [phone]

## 2021-09-04 NOTE — Progress Notes (Signed)
Subjective: CC: In great spirits this morning. Called his with Joseph Art to be on the phone while I visited him. No abdominal pain this morning at rest. Does have some pain near his drain with movement. No PRN pain meds after I saw him yesterday. Tolerating fld without n/v. Passing flatus. Small liquid bm x 2/24 hours. Mobilizing in the halls. Voiding.   Objective: Vital signs in last 24 hours: Temp:  [98.4 F (36.9 C)-100.2 F (37.9 C)] 98.8 F (37.1 C) (07/18 0740) Pulse Rate:  [67-79] 69 (07/18 0740) Resp:  [17-20] 17 (07/18 0740) BP: (93-134)/(46-80) 131/73 (07/18 0740) SpO2:  [97 %-98 %] 98 % (07/18 0740) Last BM Date : 09/02/21  Intake/Output from previous day: 07/17 0701 - 07/18 0700 In: 1205 [P.O.:1200] Out: 1300 [Urine:1230; Drains:70] Intake/Output this shift: No intake/output data recorded.  PE: Gen:  Alert, NAD, pleasant, sitting up in chair Pulm: Rate and effort normal Abd: Soft, ND, he is completely NT on my exam, small umbilical hernia that is reducible, +BS,  Upper IR drain site with dressing in place, cdi Inferior IR drain - 70cc/24 hours, bile tinged Psych: A&Ox3   Lab Results:  Recent Labs    09/03/21 0121 09/04/21 0155  WBC 13.8* 11.4*  HGB 11.0* 11.0*  HCT 33.5* 32.8*  PLT 360 390   BMET Recent Labs    09/03/21 0121 09/04/21 0155  NA 133* 134*  K 4.1 4.1  CL 103 104  CO2 20* 22  GLUCOSE 159* 122*  BUN 7* 8  CREATININE 1.09 1.09  CALCIUM 8.2* 8.2*   PT/INR No results for input(s): "LABPROT", "INR" in the last 72 hours. CMP     Component Value Date/Time   NA 134 (L) 09/04/2021 0155   K 4.1 09/04/2021 0155   CL 104 09/04/2021 0155   CO2 22 09/04/2021 0155   GLUCOSE 122 (H) 09/04/2021 0155   BUN 8 09/04/2021 0155   CREATININE 1.09 09/04/2021 0155   CALCIUM 8.2 (L) 09/04/2021 0155   PROT 6.8 09/01/2021 0106   ALBUMIN 2.1 (L) 09/01/2021 0106   AST 41 09/01/2021 0106   ALT 41 09/01/2021 0106   ALKPHOS 110 09/01/2021 0106    BILITOT 0.9 09/01/2021 0106   GFRNONAA >60 09/04/2021 0155   GFRAA 55 (L) 10/30/2019 1747   Lipase  No results found for: "LIPASE"  Studies/Results: CT ABDOMEN PELVIS W CONTRAST  Result Date: 09/02/2021 CLINICAL DATA:  Inpatient. Follow-up perforated diverticulitis with liver abscesses and pelvic abscess status post percutaneous drain placement 5 days prior. EXAM: CT ABDOMEN AND PELVIS WITH CONTRAST TECHNIQUE: Multidetector CT imaging of the abdomen and pelvis was performed using the standard protocol following bolus administration of intravenous contrast. RADIATION DOSE REDUCTION: This exam was performed according to the departmental dose-optimization program which includes automated exposure control, adjustment of the mA and/or kV according to patient size and/or use of iterative reconstruction technique. CONTRAST:  159m OMNIPAQUE IOHEXOL 300 MG/ML  SOLN COMPARISON:  08/28/2021 CT abdomen/pelvis. FINDINGS: Lower chest: Mild platelike bibasilar atelectasis. Coronary atherosclerosis. Hepatobiliary: Numerous (greater than 10) rim enhancing abscesses scattered throughout the liver. Percutaneous pigtail drain terminates in the anterior segment 3 left liver, with no definite residual measurable abscess in this location. A few small bubbles of free air anterior to the left liver are compatible with instrumentation. The other liver abscesses are stable to slightly decreased. Representative lobulated 9.0 x 6.2 cm segment 4 left liver abscess (series 3/image 16), previously 8.7 x 6.6 cm,  overall stable. Representative 4.5 x 2.6 cm segment 7 right liver abscess (series 3/image 22), previously 4.6 x 2.9 cm, slightly decreased. Representative 1.3 x 0.9 cm inferior right liver abscess (series 3/image 39), mildly decreased from 1.7 x 1.2 cm. No appreciable new or enlarging liver lesions. Normal gallbladder with no radiopaque cholelithiasis. No biliary ductal dilatation. Pancreas: Normal, with no mass or duct dilation.  Spleen: Normal size. No mass. Adrenals/Urinary Tract: Normal adrenals. A few scattered punctate 1-2 mm nonobstructing stones throughout both kidneys. No hydronephrosis. No renal masses. Interval percutaneous pigtail drain placement within the collection adherent to the anterior left bladder, which has nearly resolved, currently 1.5 x 1.1 cm with internal gas (series 3/image 86), previously 6.1 x 4.3 cm. Foci of gas remain along a fistula tract from the mid sigmoid colon to the bladder. Persistent gas in the nondependent bladder wall. Left anterior bladder wall thickening is improved. Stomach/Bowel: Normal non-distended stomach. Normal caliber small bowel with no small bowel wall thickening. Normal appendix. Oral contrast transits to the rectum. Moderate sigmoid diverticulosis. Similar wall thickening throughout the proximal to mid sigmoid colon with mild pericolonic fat stranding, unchanged. No new sites of large bowel wall thickening. Vascular/Lymphatic: Atherosclerotic nonaneurysmal abdominal aorta. Patent portal, splenic and renal veins. Mildly enlarged 1.0 cm porta hepatis node (series 3/image 32), stable. No additional pathologically enlarged lymph nodes in the abdomen or pelvis. Reproductive: Apparent prostatectomy. Other: Similar small volume ascites in anterior pelvis. No new fluid collections. Musculoskeletal: No aggressive appearing focal osseous lesions. 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 Moderate degenerative disc disease throughout the remaining visualized thoracolumbar spine. IMPRESSION: 1. Interval percutaneous pigtail drain placement within the anterior segment 3 left liver abscess, with no residual measurable abscess in this location. The numerous other liver abscesses are stable to slightly decreased. 2. Interval percutaneous drain placement within the collection adherent to the anterior left bladder, which has nearly  resolved, currently 1.5 x 1.1 cm with internal gas. Persistent gas within a soft tissue tract connecting the mid sigmoid colon to the bladder suspicious for colovesical fistula. Left anterior bladder wall thickening is improved. Persistent gas in the nondependent bladder. 3. Stable mild porta hepatis adenopathy, nonspecific, probably reactive. 4. Nonobstructing bilateral nephrolithiasis. 5. 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 6. Aortic Atherosclerosis (ICD10-I70.0). Electronically Signed   By: Ilona Sorrel M.D.   On: 09/02/2021 14:29    Anti-infectives: Anti-infectives (From admission, onward)    Start     Dose/Rate Route Frequency Ordered Stop   09/03/21 1530  micafungin (MYCAMINE) 100 mg in sodium chloride 0.9 % 100 mL IVPB        100 mg 105 mL/hr over 1 Hours Intravenous Every 24 hours 09/03/21 1439     09/01/21 2000  Ampicillin-Sulbactam (UNASYN) 3 g in sodium chloride 0.9 % 100 mL IVPB        3 g 200 mL/hr over 30 Minutes Intravenous Every 6 hours 09/01/21 1859     08/29/21 1200  piperacillin-tazobactam (ZOSYN) IVPB 3.375 g  Status:  Discontinued        3.375 g 12.5 mL/hr over 240 Minutes Intravenous Every 8 hours 08/29/21 0928 09/01/21 1820   08/28/21 0400  ceFEPIme (MAXIPIME) 2 g in sodium chloride 0.9 % 100 mL IVPB  Status:  Discontinued        2 g 200 mL/hr over 30 Minutes Intravenous Every 8 hours  08/27/21 2103 08/29/21 0844   08/27/21 2145  metroNIDAZOLE (FLAGYL) IVPB 500 mg  Status:  Discontinued        500 mg 100 mL/hr over 60 Minutes Intravenous Every 12 hours 08/27/21 2142 08/29/21 0844   08/27/21 1730  ceFEPIme (MAXIPIME) 2 g in sodium chloride 0.9 % 100 mL IVPB        2 g 200 mL/hr over 30 Minutes Intravenous  Once 08/27/21 1721 08/27/21 1948   08/27/21 1730  metroNIDAZOLE (FLAGYL) IVPB 500 mg        500 mg 100 mL/hr over 60 Minutes Intravenous  Once 08/27/21 1721 08/27/21 2110         Assessment/Plan Diverticulitis with abscess and liver lesions/abscess  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 Liver drain removed by IR 7/17. Cx's from pelvic abscess w/ rare E. Coli, candida albicans, moderate bacteroides thetaiotaomicron, and beta lactamase positive. ID following and has patient on Unasyn and Micafungin.  - Patient w/ persistent fevers over the weekend. Repeat CT 7/16 w/ perc drain in the pelvic fluid collection that has nearly resolved now measuring 1.5 x 1.1 (previously 6.1 x 4.8 x 4.3 cm). There are still continued liver abscesses that are stable to slightly decreased. Suspect this is the cause of patients persistent fevers. His wbc and fever further are improving - Cont IV abx and antifungal per ID recs - I do not think that he needs emergency surgery for his diverticulitis at this time. Will see how he does with diet advancement and place on soft diet today. Continue to trend labs. Suspect he will likely need resective surgery at some point but hopefully that will be an elective procedure rather than emergency surgery during admission.  - I spoke with patients with on speaker phone and updated her on plan.    FEN - Soft diet, IVF per TRH VTE - SCDs, subq heparin ID - Currently on Unasyn & Micafungin. Fever curve improved. Tmax 100.2/24 hours. One soft BP overnight, otherwise no tachycardia or hypotension. WBC improved from 13.8 > 11.4   Recent UTI tx with Fosfomycin  AAA OSA HTN   I reviewed nursing notes, Consultant ID and IR notes, hospitalist notes, last 24 h vitals and pain scores, last 48 h intake and output, last 24 h labs and trends, and last 24 h imaging results.   LOS: 8 days    Jillyn Ledger , [facility] 09/04/2021, 7:59 AM Please see Amion for pager number during day hours 7:00am-4:30pm

## 2021-09-04 NOTE — Progress Notes (Signed)
Mobility Specialist Progress Note:   09/04/21 1000  Mobility  Activity Ambulated independently to bathroom  Level of Assistance Standby assist, set-up cues, supervision of patient - no hands on  Assistive Device Other (Comment) (IV pole)  Distance Ambulated (ft) 15 ft  Activity Response Tolerated well  $Mobility charge 1 Mobility   Pt requiring assistance with pericare after BM in BR. Ambulated at supervision level with IV pole to BR. Will f/u for hallway ambulation.  Nelta Numbers Acute Rehab Secure Chat or Office Phone: [phone]

## 2021-09-04 NOTE — Progress Notes (Signed)
[facility]                                                                               [name] [name], is a 61 y.o. male, DOB - 10-03-1960, WEX:[id number] Admit date - 08/27/2021    Outpatient Primary MD for the patient is [facility]  LOS - 8  days    Brief summary   61 year old male with past medical history of CAD, HTN, HLD, obesity, OSA, prostate cancer presented to the hospital with complaints of fever and chills secondary to perforated sigmoid diverticulitis and liver abscess. Patient underwent Image guided drain placement in the liver and pelvic drain. General surgery on board.   Repeat CT abdomen and pelvis  shows improvement in the liver abscesses and pelvic abscess. Interval percutaneous drain placement within the collection adherent to the anterior left bladder, which has nearly resolved, currently 1.5 x 1.1 cm with internal gas. Persistent gas within a soft tissue tract connecting the mid sigmoid colon to the bladder suspicious for colovesical fistula.  Persistent gas in the nondependent bladder. ID consulted for persistent fevers and leukocytosis, changed antibiotics to IV Unasyn and added IV Micafungin as cultures grew candida in addition to E coli and Bacteroids.  Overnight T max is 100.2 and leukocytosis is improving.     Assessment & Plan    Assessment and Plan:  Acute perforated sigmoid diverticulitis with large complex diverticular abscess:  IR consulted and he underwent 2 drains placed. Cultures from the abscess growing E coli, bacteroids, candida.  Patient received 4 days of IV zosyn, but as he continued to  spike fevers, with worsening leukocytosis, repeat Blood cultures done, CT abd and pelvis repeated and ID consulted.  Cultures from the abscess growing  rare E coli, Rare Candida albicans, moderate bacteroides thetaiotaomicron,  Repeat CT abd and pelvis reviewed with the patient.  Symptomatic management with IV  anti emetics and pain meds.  Advance diet as per General surgery recommendations. Currently on soft diet and able to tolerate. He continues to have loose bowel movements.  Gen Surgery and ID no board.    Liver abscesses: MRI showing multiple liver lesions concerning for abscesses.  Image guided liver drain placement with near resolution of the abscess. Other abscesses improving.     Ascending aortic aneurysm:  Recommend outpatient follow up annually.    Chronic lower extremity edema:  Recommend ambulation.  Venous duplex is negative for DVT.     Enterococcus UTI:  Treated.    Body mass index is 37.27 kg/m. Poor prognostic factor.    OSA:  On CPAP.    Hypertension:  BP parameters are well controlled.    Headache resolved.    Mild hyponatremia:  From fluid overload.    Mild normocytic anemia.  Hemoglobin stable around 11.   Gout flare up:  He will be started on Colchicine 0.6 mg BID for 3 days followed by daily.  Improving.      Estimated body mass index is 37.27 kg/m as calculated from the following:   Height as of this encounter: 5' 10.5" (1.791 m).   Weight as of this encounter: 119.5 kg.  Code Status: full code.  DVT Prophylaxis:  Place TED hose Start: 08/31/21 0838 heparin injection 5,000 Units Start: 08/29/21 2200   Level of Care: Level of care: Telemetry Medical Family Communication: family at bedside.   Disposition Plan:     Remains inpatient appropriate:  IV antibiotics, perforated sigmoid diverticulitis.   Procedures:  CT abd pelvis showing . Interval percutaneous pigtail drain placement within the anterior segment 3 left liver abscess, with no residual measurable abscess in this location. The numerous other liver abscesses are stable to slightly decreased.  Interval percutaneous drain placement within the collection adherent to the anterior left bladder, which has nearly resolved, currently 1.5 x 1.1 cm with internal gas. Persistent  gas within a soft tissue tract connecting the mid sigmoid colon to the bladder suspicious for colovesical fistula. Left anterior bladder wall thickening is improved. Persistent gas in the nondependent bladder  Consultants:   General surgery  ID.  IR   Antimicrobials:   Anti-infectives (From admission, onward)    Start     Dose/Rate Route Frequency Ordered Stop   09/03/21 1530  micafungin (MYCAMINE) 100 mg in sodium chloride 0.9 % 100 mL IVPB        100 mg 105 mL/hr over 1 Hours Intravenous Every 24 hours 09/03/21 1439     09/01/21 2000  Ampicillin-Sulbactam (UNASYN) 3 g in sodium chloride 0.9 % 100 mL IVPB        3 g 200 mL/hr over 30 Minutes Intravenous Every 6 hours 09/01/21 1859     08/29/21 1200  piperacillin-tazobactam (ZOSYN) IVPB 3.375 g  Status:  Discontinued        3.375 g 12.5 mL/hr over 240 Minutes Intravenous Every 8 hours 08/29/21 0928 09/01/21 1820   08/28/21 0400  ceFEPIme (MAXIPIME) 2 g in sodium chloride 0.9 % 100 mL IVPB  Status:  Discontinued        2 g 200 mL/hr over 30 Minutes Intravenous Every 8 hours 08/27/21 2103 08/29/21 0844   08/27/21 2145  metroNIDAZOLE (FLAGYL) IVPB 500 mg  Status:  Discontinued        500 mg 100 mL/hr over 60 Minutes Intravenous Every 12 hours 08/27/21 2142 08/29/21 0844   08/27/21 1730  ceFEPIme (MAXIPIME) 2 g in sodium chloride 0.9 % 100 mL IVPB        2 g 200 mL/hr over 30 Minutes Intravenous  Once 08/27/21 1721 08/27/21 1948   08/27/21 1730  metroNIDAZOLE (FLAGYL) IVPB 500 mg        500 mg 100 mL/hr over 60 Minutes Intravenous  Once 08/27/21 1721 08/27/21 2110        Medications  Scheduled Meds:  allopurinol  100 mg Oral Daily   benzonatate  100 mg Oral TID   dextromethorphan-guaiFENesin  1 tablet Oral BID   fluticasone  2 spray Each Nare Daily   furosemide  40 mg Oral Daily   heparin  5,000 Units Subcutaneous Q8H   metoprolol succinate  25 mg Oral Daily   pantoprazole  40 mg Oral QHS   saccharomyces boulardii  250 mg  Oral BID   sodium chloride flush  5 mL Intracatheter Q8H   Continuous Infusions:  ampicillin-sulbactam (UNASYN) IV 3 g (09/04/21 0801)   micafungin (MYCAMINE) 100 mg in sodium chloride 0.9 % 100 mL IVPB 100 mg (09/03/21 1624)   PRN Meds:.albuterol, butalbital-acetaminophen-caffeine, HYDROmorphone (DILAUDID) injection, ondansetron **OR** ondansetron (ZOFRAN) IV, oxyCODONE, simethicone    Subjective:   [name] [name] was seen and examined today.  Gout is improving, he is in good spirits, reports walked twice this morning.   Objective:   Vitals:   09/03/21 2043 09/03/21 2106 09/04/21 0425 09/04/21 0740  BP: (!) 93/46 134/75 129/80 131/73  Pulse: 77 73 68 69  Resp: '20  18 17  '$ Temp: 100.2 F (37.9 C)  98.4 F (36.9 C) 98.8 F (37.1 C)  TempSrc: Oral  Oral Oral  SpO2: 98%  97% 98%  Weight:      Height:        Intake/Output Summary (Last 24 hours) at 09/04/2021 1310 Last data filed at 09/04/2021 1058 Gross per 24 hour  Intake 970 ml  Output 1305 ml  Net -335 ml    Filed Weights   08/31/21 0758 09/01/21 0412 09/02/21 0426  Weight: 121.4 kg 120.6 kg 119.5 kg     Exam  General exam: Appears calm and comfortable  Respiratory system: Clear to auscultation. Respiratory effort normal. Cardiovascular system: S1 & S2 heard, RRR. No JVD,  No pedal edema. Gastrointestinal system: Abdomen is soft, non tender, 2 drains in place.  Normal bowel sounds heard. Central nervous system: Alert and oriented. No focal neurological deficits. Extremities:Left second metacarpophalangeal joint swelling and erythema is improving.  Skin: No rashes,  Psychiatry: Mood & affect appropriate.       Data Reviewed:  I have personally reviewed following labs and imaging studies   CBC Lab Results  Component Value Date   WBC 11.4 (H) 09/04/2021   RBC 3.54 (L) 09/04/2021   HGB 11.0 (L) 09/04/2021   HCT 32.8 (L) 09/04/2021   MCV 92.7 09/04/2021   MCH 31.1 09/04/2021   PLT 390 09/04/2021    MCHC 33.5 09/04/2021   RDW 13.4 09/04/2021   LYMPHSABS 1.4 09/04/2021   MONOABS 0.8 09/04/2021   EOSABS 0.0 09/04/2021   BASOSABS 0.0 05/39/7673     Last metabolic panel Lab Results  Component Value Date   NA 134 (L) 09/04/2021   K 4.1 09/04/2021   CL 104 09/04/2021   CO2 22 09/04/2021   BUN 8 09/04/2021   CREATININE 1.09 09/04/2021   GLUCOSE 122 (H) 09/04/2021   GFRNONAA >60 09/04/2021   GFRAA 55 (L) 10/30/2019   CALCIUM 8.2 (L) 09/04/2021   PHOS 3.2 09/02/2021   PROT 6.8 09/01/2021   ALBUMIN 2.1 (L) 09/01/2021   BILITOT 0.9 09/01/2021   ALKPHOS 110 09/01/2021   AST 41 09/01/2021   ALT 41 09/01/2021   ANIONGAP 8 09/04/2021    CBG (last 3)  No results for input(s): "GLUCAP" in the last 72 hours.    Coagulation Profile: No results for input(s): "INR", "PROTIME" in the last 168 hours.    Radiology Studies: No results found.     Hosie Poisson M.D. [facility] 09/04/2021, 1:10 PM  Available via Epic secure chat 7am-7pm After 7 pm, please refer to night coverage provider listed on amion.

## 2021-09-04 NOTE — Telephone Encounter (Signed)
Patient Advocate Encounter   Received notification that prior authorization for Cresemba '186MG'$  capsules is required.   PA submitted on 09/04/2021 Key [id number] Status is pending       Lyndel Safe, [place] Patient Advocate Specialist [place] Patient Advocate Team Direct Number: [phone]  Fax: [phone]

## 2021-09-04 NOTE — Progress Notes (Signed)
[facility] for Infectious [facility]   Reason for visit: Follow up on large complex diverticular abscess  Interval History: states he feels really well today; walking the halls some, Tmax 100.2 and pleased to be able to advance his diet today. Wife at bedside.   Physical Exam: Constitutional:  Vitals:   09/04/21 0425 09/04/21 0740  BP: 129/80 131/73  Pulse: 68 69  Resp: 18 17  Temp: 98.4 F (36.9 C) 98.8 F (37.1 C)  SpO2: 97% 98%   patient appears in NAD Respiratory: Normal respiratory effort; CTA B  Review of Systems: Constitutional: negative for fevers and chills  Lab Results  Component Value Date   WBC 11.4 (H) 09/04/2021   HGB 11.0 (L) 09/04/2021   HCT 32.8 (L) 09/04/2021   MCV 92.7 09/04/2021   PLT 390 09/04/2021    Lab Results  Component Value Date   CREATININE 1.09 09/04/2021   BUN 8 09/04/2021   NA 134 (L) 09/04/2021   K 4.1 09/04/2021   CL 104 09/04/2021   CO2 22 09/04/2021    Lab Results  Component Value Date   ALT 41 09/01/2021   AST 41 09/01/2021   ALKPHOS 110 09/01/2021     Microbiology: Recent Results (from the past 240 hour(s))  Urine Culture     Status: None   Collection Time: 08/27/21  3:48 PM   Specimen: In/Out Cath Urine  Result Value Ref Range Status   Specimen Description IN/OUT CATH URINE  Final   Special Requests NONE  Final   Culture   Final    NO GROWTH Performed at [facility], [address] [place], [place] [address]    Report Status 08/28/2021 FINAL  Final  Blood Culture (routine x 2)     Status: None   Collection Time: 08/27/21  4:22 PM   Specimen: BLOOD  Result Value Ref Range Status   Specimen Description BLOOD BLOOD LEFT FOREARM  Final   Special Requests   Final    BOTTLES DRAWN AEROBIC AND ANAEROBIC Blood Culture results may not be optimal due to an inadequate volume of blood received in culture bottles   Culture   Final    NO GROWTH 5 DAYS Performed at [facility], [place] [address],  [place], [place] [address]    Report Status 09/01/2021 FINAL  Final  Blood Culture (routine x 2)     Status: None   Collection Time: 08/27/21  6:24 PM   Specimen: BLOOD RIGHT FOREARM  Result Value Ref Range Status   Specimen Description BLOOD RIGHT FOREARM  Final   Special Requests   Final    BOTTLES DRAWN AEROBIC AND ANAEROBIC Blood Culture results may not be optimal due to an inadequate volume of blood received in culture bottles   Culture   Final    NO GROWTH 5 DAYS Performed at [facility], [place] [address] [place], [place] [address]    Report Status 09/01/2021 FINAL  Final  Respiratory (~20 pathogens) panel by PCR     Status: None   Collection Time: 08/28/21  3:42 AM   Specimen: Nasopharyngeal Swab; Respiratory  Result Value Ref Range Status   Adenovirus NOT DETECTED NOT DETECTED Final   Coronavirus 229E NOT DETECTED NOT DETECTED Final    Comment: (NOTE) The Coronavirus on the Respiratory Panel, DOES NOT test for the novel  Coronavirus (2019 nCoV)    Coronavirus HKU1 NOT DETECTED NOT DETECTED Final   Coronavirus NL63  NOT DETECTED NOT DETECTED Final   Coronavirus OC43 NOT DETECTED NOT DETECTED Final   Metapneumovirus NOT DETECTED NOT DETECTED Final   Rhinovirus / Enterovirus NOT DETECTED NOT DETECTED Final   Influenza A NOT DETECTED NOT DETECTED Final   Influenza B NOT DETECTED NOT DETECTED Final   Parainfluenza Virus 1 NOT DETECTED NOT DETECTED Final   Parainfluenza Virus 2 NOT DETECTED NOT DETECTED Final   Parainfluenza Virus 3 NOT DETECTED NOT DETECTED Final   Parainfluenza Virus 4 NOT DETECTED NOT DETECTED Final   Respiratory Syncytial Virus NOT DETECTED NOT DETECTED Final   Bordetella pertussis NOT DETECTED NOT DETECTED Final   Bordetella Parapertussis NOT DETECTED NOT DETECTED Final   Chlamydophila pneumoniae NOT DETECTED NOT DETECTED Final   Mycoplasma pneumoniae NOT DETECTED NOT DETECTED Final    Comment: Performed at [facility], [place] [address]  [place], [place] [address]  Aerobic/Anaerobic Culture w Gram Stain (surgical/deep wound)     Status: None   Collection Time: 08/28/21  4:14 PM   Specimen: Abscess  Result Value Ref Range Status   Specimen Description ABSCESS  Final   Special Requests ABDOMEN IN PELVIS, SAMPLE 1  Final   Gram Stain   Final    ABUNDANT WBC PRESENT,BOTH PMN AND MONONUCLEAR FEW GRAM NEGATIVE RODS    Culture   Final    RARE ESCHERICHIA COLI RARE CANDIDA ALBICANS MODERATE BACTEROIDES THETAIOTAOMICRON BETA LACTAMASE POSITIVE Performed at [facility], [place] [address] [place], [place] [address]    Report Status 09/01/2021 FINAL  Final   Organism ID, Bacteria ESCHERICHIA COLI  Final      Susceptibility   Escherichia coli - MIC*    AMPICILLIN <=2 SENSITIVE Sensitive     CEFAZOLIN <=4 SENSITIVE Sensitive     CEFEPIME <=0.12 SENSITIVE Sensitive     CEFTAZIDIME <=1 SENSITIVE Sensitive     CEFTRIAXONE <=0.25 SENSITIVE Sensitive     CIPROFLOXACIN <=0.25 SENSITIVE Sensitive     GENTAMICIN <=1 SENSITIVE Sensitive     IMIPENEM <=0.25 SENSITIVE Sensitive     TRIMETH/SULFA <=20 SENSITIVE Sensitive     AMPICILLIN/SULBACTAM <=2 SENSITIVE Sensitive     PIP/TAZO <=4 SENSITIVE Sensitive     * RARE ESCHERICHIA COLI  Aerobic/Anaerobic Culture w Gram Stain (surgical/deep wound)     Status: None   Collection Time: 08/28/21  5:15 PM   Specimen: Abscess  Result Value Ref Range Status   Specimen Description ABSCESS  Final   Special Requests LIVER SAMPLE 2  Final   Gram Stain   Final    ABUNDANT WBC PRESENT, PREDOMINANTLY PMN NO ORGANISMS SEEN    Culture   Final    No growth aerobically or anaerobically. Performed at [facility], [place] [address] [place], [place] [address]    Report Status 09/02/2021 FINAL  Final  Culture, blood (Routine X 2) w Reflex to ID Panel     Status: None   Collection Time: 08/29/21  6:47 PM   Specimen: BLOOD  Result Value Ref Range Status   Specimen Description BLOOD BLOOD RIGHT  HAND  Final   Special Requests   Final    BOTTLES DRAWN AEROBIC AND ANAEROBIC Blood Culture adequate volume   Culture   Final    NO GROWTH 5 DAYS Performed at [facility], [place] [address], [place], [place] [address]    Report Status 09/03/2021 FINAL  Final  Culture, blood (Routine X 2) w Reflex to ID Panel  Status: None   Collection Time: 08/29/21  6:48 PM   Specimen: BLOOD  Result Value Ref Range Status   Specimen Description BLOOD LEFT ANTECUBITAL  Final   Special Requests   Final    BOTTLES DRAWN AEROBIC AND ANAEROBIC Blood Culture adequate volume   Culture   Final    NO GROWTH 5 DAYS Performed at [facility], [address], [place], [place] [address]    Report Status 09/03/2021 FINAL  Final  Culture, blood (Routine X 2) w Reflex to ID Panel     Status: None (Preliminary result)   Collection Time: 09/01/21  2:02 PM   Specimen: BLOOD RIGHT HAND  Result Value Ref Range Status   Specimen Description BLOOD RIGHT HAND  Final   Special Requests   Final    BOTTLES DRAWN AEROBIC AND ANAEROBIC Blood Culture results may not be optimal due to an inadequate volume of blood received in culture bottles   Culture   Final    NO GROWTH 3 DAYS Performed at [facility], [place] [address], [place], [place] [address]    Report Status PENDING  Incomplete  Culture, blood (Routine X 2) w Reflex to ID Panel     Status: None (Preliminary result)   Collection Time: 09/01/21  2:02 PM   Specimen: BLOOD RIGHT HAND  Result Value Ref Range Status   Specimen Description BLOOD RIGHT HAND  Final   Special Requests   Final    BOTTLES DRAWN AEROBIC AND ANAEROBIC Blood Culture adequate volume   Culture   Final    NO GROWTH 3 DAYS Performed at [facility], [place] [address], [place], [place] [address]    Report Status PENDING  Incomplete    Impression/Plan:  1. Intra abdominal abscess - improving overall and advancing diet.  Cultures noted and on IV ampicillin/sulbactam and  antifungal coverage added yesterday with growth of Candida.  Will continue with the drain at discharge and follow up in the IR drain clinic.  Plan for oral amoxicillin/clavulanate at discharge =/- fluconazole.  May need to consider IV micafungin at discharge if the Qtc remains elevated.    2.  Prolonged Qtc - 513 on EKG.  Discussed with cardiology.  Will repeat prior to discharge and consider fluconazole orally if improved.    3.  Leukocytosis - improving WBC down to 11.4.  will continue to monitor.

## 2021-09-05 DIAGNOSIS — K5780 Diverticulitis of intestine, part unspecified, with perforation and abscess without bleeding: Secondary | ICD-10-CM | POA: Diagnosis not present

## 2021-09-05 DIAGNOSIS — K5720 Diverticulitis of large intestine with perforation and abscess without bleeding: Secondary | ICD-10-CM

## 2021-09-05 DIAGNOSIS — K750 Abscess of liver: Secondary | ICD-10-CM

## 2021-09-05 LAB — BASIC METABOLIC PANEL
Anion gap: 10 (ref 5–15)
BUN: 11 mg/dL (ref 8–23)
CO2: 23 mmol/L (ref 22–32)
Calcium: 8.2 mg/dL — ABNORMAL LOW (ref 8.9–10.3)
Chloride: 102 mmol/L (ref 98–111)
Creatinine, Ser: 1.25 mg/dL — ABNORMAL HIGH (ref 0.61–1.24)
GFR, Estimated: >60 mL/min (ref >60)
Glucose, Bld: 129 mg/dL — ABNORMAL HIGH (ref 70–99)
Potassium: 4 mmol/L (ref 3.5–5.1)
Sodium: 135 mmol/L (ref 135–145)

## 2021-09-05 LAB — CBC WITH DIFFERENTIAL/PLATELET
Abs Immature Granulocytes: 0.08 10*3/uL — ABNORMAL HIGH (ref 0.00–0.07)
Basophils Absolute: 0 10*3/uL (ref 0.0–0.1)
Basophils Relative: 0 %
Eosinophils Absolute: 0.1 10*3/uL (ref 0.0–0.5)
Eosinophils Relative: 1 %
HCT: 32.2 % — ABNORMAL LOW (ref 39.0–52.0)
Hemoglobin: 10.8 g/dL — ABNORMAL LOW (ref 13.0–17.0)
Immature Granulocytes: 1 %
Lymphocytes Relative: 12 %
Lymphs Abs: 1.2 10*3/uL (ref 0.7–4.0)
MCH: 31.3 pg (ref 26.0–34.0)
MCHC: 33.5 g/dL (ref 30.0–36.0)
MCV: 93.3 fL (ref 80.0–100.0)
Monocytes Absolute: 0.8 10*3/uL (ref 0.1–1.0)
Monocytes Relative: 8 %
Neutro Abs: 7.7 10*3/uL (ref 1.7–7.7)
Neutrophils Relative %: 78 %
Platelets: 349 10*3/uL (ref 150–400)
RBC: 3.45 MIL/uL — ABNORMAL LOW (ref 4.22–5.81)
RDW: 13.5 % (ref 11.5–15.5)
WBC: 9.9 10*3/uL (ref 4.0–10.5)
nRBC: 0 % (ref 0.0–0.2)

## 2021-09-05 MED ORDER — FLUCONAZOLE 200 MG PO TABS: 400.0000 mg | ORAL_TABLET | Freq: Every day | ORAL | 0 refills | Status: AC

## 2021-09-05 MED ORDER — FUROSEMIDE 10 MG/ML IJ SOLN
20.0000 mg | Freq: Once | INTRAMUSCULAR | Status: DC
Start: 2021-09-05 — End: 2021-09-05

## 2021-09-05 MED ORDER — COLCHICINE 0.6 MG PO TABS
1.2000 mg | ORAL_TABLET | Freq: Once | ORAL | Status: AC
Start: 2021-09-05 — End: 2021-09-05
  Administered 2021-09-05: 1.2 mg via ORAL
  Filled 2021-09-05: qty 2

## 2021-09-05 MED ORDER — AMOXICILLIN-POT CLAVULANATE 875-125 MG PO TABS: 1.0000 | ORAL_TABLET | Freq: Two times a day (BID) | ORAL | 0 refills | Status: AC

## 2021-09-05 MED ORDER — SODIUM CHLORIDE 0.9 % IV BOLUS: 500.0000 mL | Freq: Once | INTRAVENOUS | Status: DC

## 2021-09-05 MED ORDER — COLCHICINE 0.6 MG PO TABS: 0.6000 mg | ORAL_TABLET | Freq: Every day | ORAL | 0 refills | Status: AC

## 2021-09-05 NOTE — Progress Notes (Signed)
[facility] for Infectious Disease   Reason for visit: Follow up on intra abdominal abscess  Interval History: WBC wnl, no fever.  No new complaints.     Physical Exam: Constitutional:  Vitals:   09/05/21 0440 09/05/21 0932  BP: 128/73 117/68  Pulse: 72 71  Resp: 18 17  Temp: 99.1 F (37.3 C) 99 F (37.2 C)  SpO2: 97% 95%   patient appears in NAD Respiratory: Normal respiratory effort; CTA B GI: soft, nt, nd  Review of Systems: Constitutional: negative for fevers and chills  Lab Results  Component Value Date   WBC 9.9 09/05/2021   HGB 10.8 (L) 09/05/2021   HCT 32.2 (L) 09/05/2021   MCV 93.3 09/05/2021   PLT 349 09/05/2021    Lab Results  Component Value Date   CREATININE 1.25 (H) 09/05/2021   BUN 11 09/05/2021   NA 135 09/05/2021   K 4.0 09/05/2021   CL 102 09/05/2021   CO2 23 09/05/2021    Lab Results  Component Value Date   ALT 41 09/01/2021   AST 41 09/01/2021   ALKPHOS 110 09/01/2021     Microbiology: Recent Results (from the past 240 hour(s))  Urine Culture     Status: None   Collection Time: 08/27/21  3:48 PM   Specimen: In/Out Cath Urine  Result Value Ref Range Status   Specimen Description IN/OUT CATH URINE  Final   Special Requests NONE  Final   Culture   Final    NO GROWTH Performed at [facility], [address], [place], [place] [address]    Report Status 08/28/2021 FINAL  Final  Blood Culture (routine x 2)     Status: None   Collection Time: 08/27/21  4:22 PM   Specimen: BLOOD  Result Value Ref Range Status   Specimen Description BLOOD BLOOD LEFT FOREARM  Final   Special Requests   Final    BOTTLES DRAWN AEROBIC AND ANAEROBIC Blood Culture results may not be optimal due to an inadequate volume of blood received in culture bottles   Culture   Final    NO GROWTH 5 DAYS Performed at [facility], [place] [address], [place], [place] [address]    Report Status 09/01/2021 FINAL  Final  Blood Culture (routine x 2)      Status: None   Collection Time: 08/27/21  6:24 PM   Specimen: BLOOD RIGHT FOREARM  Result Value Ref Range Status   Specimen Description BLOOD RIGHT FOREARM  Final   Special Requests   Final    BOTTLES DRAWN AEROBIC AND ANAEROBIC Blood Culture results may not be optimal due to an inadequate volume of blood received in culture bottles   Culture   Final    NO GROWTH 5 DAYS Performed at [facility], [place] [address] [place],  [address]    Report Status 09/01/2021 FINAL  Final  Respiratory (~20 pathogens) panel by PCR     Status: None   Collection Time: 08/28/21  3:42 AM   Specimen: Nasopharyngeal Swab; Respiratory  Result Value Ref Range Status   Adenovirus NOT DETECTED NOT DETECTED Final   Coronavirus 229E NOT DETECTED NOT DETECTED Final    Comment: (NOTE) The Coronavirus on the Respiratory Panel, DOES NOT test for the novel  Coronavirus (2019 nCoV)    Coronavirus HKU1 NOT DETECTED NOT DETECTED Final   Coronavirus NL63 NOT DETECTED NOT DETECTED Final   Coronavirus OC43 NOT DETECTED NOT DETECTED  Final   Metapneumovirus NOT DETECTED NOT DETECTED Final   Rhinovirus / Enterovirus NOT DETECTED NOT DETECTED Final   Influenza A NOT DETECTED NOT DETECTED Final   Influenza B NOT DETECTED NOT DETECTED Final   Parainfluenza Virus 1 NOT DETECTED NOT DETECTED Final   Parainfluenza Virus 2 NOT DETECTED NOT DETECTED Final   Parainfluenza Virus 3 NOT DETECTED NOT DETECTED Final   Parainfluenza Virus 4 NOT DETECTED NOT DETECTED Final   Respiratory Syncytial Virus NOT DETECTED NOT DETECTED Final   Bordetella pertussis NOT DETECTED NOT DETECTED Final   Bordetella Parapertussis NOT DETECTED NOT DETECTED Final   Chlamydophila pneumoniae NOT DETECTED NOT DETECTED Final   Mycoplasma pneumoniae NOT DETECTED NOT DETECTED Final    Comment: Performed at [facility], [place] [address] [place], [place] [address]  Aerobic/Anaerobic Culture w Gram Stain (surgical/deep wound)     Status: None    Collection Time: 08/28/21  4:14 PM   Specimen: Abscess  Result Value Ref Range Status   Specimen Description ABSCESS  Final   Special Requests ABDOMEN IN PELVIS, SAMPLE 1  Final   Gram Stain   Final    ABUNDANT WBC PRESENT,BOTH PMN AND MONONUCLEAR FEW GRAM NEGATIVE RODS    Culture   Final    RARE ESCHERICHIA COLI RARE CANDIDA ALBICANS MODERATE BACTEROIDES THETAIOTAOMICRON BETA LACTAMASE POSITIVE Performed at [facility], [place] [address], [place], [place] [address]    Report Status 09/01/2021 FINAL  Final   Organism ID, Bacteria ESCHERICHIA COLI  Final      Susceptibility   Escherichia coli - MIC*    AMPICILLIN <=2 SENSITIVE Sensitive     CEFAZOLIN <=4 SENSITIVE Sensitive     CEFEPIME <=0.12 SENSITIVE Sensitive     CEFTAZIDIME <=1 SENSITIVE Sensitive     CEFTRIAXONE <=0.25 SENSITIVE Sensitive     CIPROFLOXACIN <=0.25 SENSITIVE Sensitive     GENTAMICIN <=1 SENSITIVE Sensitive     IMIPENEM <=0.25 SENSITIVE Sensitive     TRIMETH/SULFA <=20 SENSITIVE Sensitive     AMPICILLIN/SULBACTAM <=2 SENSITIVE Sensitive     PIP/TAZO <=4 SENSITIVE Sensitive     * RARE ESCHERICHIA COLI  Aerobic/Anaerobic Culture w Gram Stain (surgical/deep wound)     Status: None   Collection Time: 08/28/21  5:15 PM   Specimen: Abscess  Result Value Ref Range Status   Specimen Description ABSCESS  Final   Special Requests LIVER SAMPLE 2  Final   Gram Stain   Final    ABUNDANT WBC PRESENT, PREDOMINANTLY PMN NO ORGANISMS SEEN    Culture   Final    No growth aerobically or anaerobically. Performed at [facility], [place] [address], [place], [place] [address]    Report Status 09/02/2021 FINAL  Final  Culture, blood (Routine X 2) w Reflex to ID Panel     Status: None   Collection Time: 08/29/21  6:47 PM   Specimen: BLOOD  Result Value Ref Range Status   Specimen Description BLOOD BLOOD RIGHT HAND  Final   Special Requests   Final    BOTTLES DRAWN AEROBIC AND ANAEROBIC Blood Culture adequate  volume   Culture   Final    NO GROWTH 5 DAYS Performed at [facility], [place] [address], [place], [place] [address]    Report Status 09/03/2021 FINAL  Final  Culture, blood (Routine X 2) w Reflex to ID Panel     Status: None   Collection Time: 08/29/21  6:48 PM  Specimen: BLOOD  Result Value Ref Range Status   Specimen Description BLOOD LEFT ANTECUBITAL  Final   Special Requests   Final    BOTTLES DRAWN AEROBIC AND ANAEROBIC Blood Culture adequate volume   Culture   Final    NO GROWTH 5 DAYS Performed at [facility], [address], [place], [place] [address]    Report Status 09/03/2021 FINAL  Final  Culture, blood (Routine X 2) w Reflex to ID Panel     Status: None (Preliminary result)   Collection Time: 09/01/21  2:02 PM   Specimen: BLOOD RIGHT HAND  Result Value Ref Range Status   Specimen Description BLOOD RIGHT HAND  Final   Special Requests   Final    BOTTLES DRAWN AEROBIC AND ANAEROBIC Blood Culture results may not be optimal due to an inadequate volume of blood received in culture bottles   Culture   Final    NO GROWTH 4 DAYS Performed at [facility], [place] [address] [place], [place] [address]    Report Status PENDING  Incomplete  Culture, blood (Routine X 2) w Reflex to ID Panel     Status: None (Preliminary result)   Collection Time: 09/01/21  2:02 PM   Specimen: BLOOD RIGHT HAND  Result Value Ref Range Status   Specimen Description BLOOD RIGHT HAND  Final   Special Requests   Final    BOTTLES DRAWN AEROBIC AND ANAEROBIC Blood Culture adequate volume   Culture   Final    NO GROWTH 4 DAYS Performed at [facility], [place] [address] [place], [place] [address]    Report Status PENDING  Incomplete    Impression/Plan:  1. Diverticulitis with abscess - cultures noted and has responded well to ampicillin/sulbactam and antifungal treatment.   Will continue with antibiotics pending drain removal as an outpatient I recommend he continue with  Augmentin and fluconazole 400 mg for three weeks at discharge.   2.  Prolonged Qtc - repeated EKG and stable Qtc and recalculated with the help of the cardiology team and < 500.  OK from ID standpoint to use fluconazole as above.    3.  Drain - he will follow up in the drain clinic for drain management.  I will have him follow up with me after the drain clinic appt - appt scheduled for 8/2 at 3pm  I will sign off, call with questions

## 2021-09-05 NOTE — Progress Notes (Addendum)
Mobility Specialist Progress Note    09/05/21 1226  Mobility  Activity Ambulated independently in hallway  Level of Assistance Independent  Assistive Device None  Distance Ambulated (ft) 550 ft  Activity Response Tolerated well  $Mobility charge 1 Mobility   Pt received in chair. Complained of right knee pain but agreeable to walk. Used hand rails to guard self due to knee pain but no faults during ambulation. Left in chair will all need mets and call bell within reach.   [name] [name] Mobility Specialist MS [facility] #:  [phone] Acute Rehab Office:  [phone]

## 2021-09-05 NOTE — Progress Notes (Signed)
PROGRESS NOTE    [name] [name]  SAY:[id number] DOB: 02/24/1960 DOA: 08/27/2021 PCP: [facility]   Brief Narrative: 61 year old male with past medical history of CAD, HTN, HLD, obesity, OSA, prostate cancer presented to the hospital with complaints of fever and chills secondary to perforated sigmoid diverticulitis and liver abscess.  General surgery on board. Continues to have fever.  Low threshold to consult ID but for now holding as the patient is on adequate antibiotic coverage.   Assessment and Plan:  Acute perforated sigmoid diverticulitis with diverticular abscess CT abdomen/pelvis significant for distal descending colon and upper sigmoid colon diverticulitis with associated large, complex diverticular abscess. Patient started empirically on Cefepime and Flagyl. General surgery consulted with recommendations for IR drainage/percutaneous tube placement. Percutaneous drainage and tube placement performed on 7/11. Wound culture (7/11) significant for E. Coli, candida albicans and bacteroides thetaiotaomicron. Patient transitioned to Zosyn initially for treatment before transition to Unasyn and micafungin for treatment per ID recommendations. -Continue Unasyn and micafungin while inpatient with transition to Augmentin and Fluconazole on discharge.  Liver abscesses Noted on MRI imaging. IR consulted and placed percutaneous drain. Wound culture without organisms on gram stain and no growth on culture.  Ascending aortic aneurysm Continue outpatient surveillance.  Chronic lower extremity edema Worsened during admission. Started on Lasix IV for management with improvement.  Elevated creatinine Mild. Secondary to Lasix IV for management of edema -Hold Lasix  Primary hypertension Patient is on lisinopril, Toprol XL and hydrochlorothiazide as an outpatient.  Headache Resolved.  Hyponatremia Mild. Resolved.  Normocytic anemia Stable. Unsure of  etiology. Recommend outpatient workup.  Gout flare Improving on colchicine -Continue colchicine   DVT prophylaxis: Heparin subq Code Status:   Code Status: Full Code Family Communication: Wife on telephone Disposition Plan: Discharge home tomorrow on oral antibiotics  Consultants:  Infectious disease General surgery Interventional radiology  Procedures:  Image guided drain placement to pelvis (#1) and liver (#2) (7/11)  Antimicrobials: Zosyn Flagyl Cefepime Micafungin Unasyn    Subjective: Patient reports feeling better overall but is worried about his worsening edema. He also has noted that his left hand had significant swelling making it difficult for him to close his hand.  Objective: BP 138/77 (BP Location: Right Arm)   Pulse 70   Temp 98.7 F (37.1 C) (Oral)   Resp 17   Ht 5' 10.5" (1.791 m)   Wt 119.5 kg   SpO2 100%   BMI 37.27 kg/m   Examination:  General exam: Appears calm and comfortable Respiratory system: Clear to auscultation. Respiratory effort normal. Cardiovascular system: S1 & S2 heard, RRR. No murmurs, rubs, gallops or clicks. Gastrointestinal system: Abdomen is nondistended, soft and nontender. Normal bowel sounds heard. Drain with bilious drainage. Central nervous system: Alert and oriented. No focal neurological deficits. Musculoskeletal: LE edema. Left second digit with moderate edema and mild tenderness. No calf tenderness Skin: No cyanosis. No rashes Psychiatry: Judgement and insight appear normal. Mood & affect appropriate.    Data Reviewed: I have personally reviewed following labs and imaging studies  CBC Lab Results  Component Value Date   WBC 9.9 09/05/2021   RBC 3.45 (L) 09/05/2021   HGB 10.8 (L) 09/05/2021   HCT 32.2 (L) 09/05/2021   MCV 93.3 09/05/2021   MCH 31.3 09/05/2021   PLT 349 09/05/2021   MCHC 33.5 09/05/2021   RDW 13.5 09/05/2021   LYMPHSABS 1.2 09/05/2021   MONOABS 0.8 09/05/2021   EOSABS 0.1 09/05/2021  BASOSABS 0.0 53/97/6734     Last metabolic panel Lab Results  Component Value Date   NA 135 09/05/2021   K 4.0 09/05/2021   CL 102 09/05/2021   CO2 23 09/05/2021   BUN 11 09/05/2021   CREATININE 1.25 (H) 09/05/2021   GLUCOSE 129 (H) 09/05/2021   GFRNONAA >60 09/05/2021   GFRAA 55 (L) 10/30/2019   CALCIUM 8.2 (L) 09/05/2021   PHOS 3.2 09/02/2021   PROT 6.8 09/01/2021   ALBUMIN 2.1 (L) 09/01/2021   BILITOT 0.9 09/01/2021   ALKPHOS 110 09/01/2021   AST 41 09/01/2021   ALT 41 09/01/2021   ANIONGAP 10 09/05/2021    GFR: Estimated Creatinine Clearance: 80 mL/min (A) (by C-G formula based on SCr of 1.25 mg/dL (H)).  Recent Results (from the past 240 hour(s))  Urine Culture     Status: None   Collection Time: 08/27/21  3:48 PM   Specimen: In/Out Cath Urine  Result Value Ref Range Status   Specimen Description IN/OUT CATH URINE  Final   Special Requests NONE  Final   Culture   Final    NO GROWTH Performed at [facility], [address] [place], [place] [address]    Report Status 08/28/2021 FINAL  Final  Blood Culture (routine x 2)     Status: None   Collection Time: 08/27/21  4:22 PM   Specimen: BLOOD  Result Value Ref Range Status   Specimen Description BLOOD BLOOD LEFT FOREARM  Final   Special Requests   Final    BOTTLES DRAWN AEROBIC AND ANAEROBIC Blood Culture results may not be optimal due to an inadequate volume of blood received in culture bottles   Culture   Final    NO GROWTH 5 DAYS Performed at [facility], [place] [address], [place], [place] [address]    Report Status 09/01/2021 FINAL  Final  Blood Culture (routine x 2)     Status: None   Collection Time: 08/27/21  6:24 PM   Specimen: BLOOD RIGHT FOREARM  Result Value Ref Range Status   Specimen Description BLOOD RIGHT FOREARM  Final   Special Requests   Final    BOTTLES DRAWN AEROBIC AND ANAEROBIC Blood Culture results may not be optimal due to an inadequate volume of blood received in  culture bottles   Culture   Final    NO GROWTH 5 DAYS Performed at [facility], [place] [address], [place], [place] [address]    Report Status 09/01/2021 FINAL  Final  Respiratory (~20 pathogens) panel by PCR     Status: None   Collection Time: 08/28/21  3:42 AM   Specimen: Nasopharyngeal Swab; Respiratory  Result Value Ref Range Status   Adenovirus NOT DETECTED NOT DETECTED Final   Coronavirus 229E NOT DETECTED NOT DETECTED Final    Comment: (NOTE) The Coronavirus on the Respiratory Panel, DOES NOT test for the novel  Coronavirus (2019 nCoV)    Coronavirus HKU1 NOT DETECTED NOT DETECTED Final   Coronavirus NL63 NOT DETECTED NOT DETECTED Final   Coronavirus OC43 NOT DETECTED NOT DETECTED Final   Metapneumovirus NOT DETECTED NOT DETECTED Final   Rhinovirus / Enterovirus NOT DETECTED NOT DETECTED Final   Influenza A NOT DETECTED NOT DETECTED Final   Influenza B NOT DETECTED NOT DETECTED Final   Parainfluenza Virus 1 NOT DETECTED NOT DETECTED Final   Parainfluenza Virus 2 NOT DETECTED NOT DETECTED Final   Parainfluenza Virus 3 NOT DETECTED NOT DETECTED Final  Parainfluenza Virus 4 NOT DETECTED NOT DETECTED Final   Respiratory Syncytial Virus NOT DETECTED NOT DETECTED Final   Bordetella pertussis NOT DETECTED NOT DETECTED Final   Bordetella Parapertussis NOT DETECTED NOT DETECTED Final   Chlamydophila pneumoniae NOT DETECTED NOT DETECTED Final   Mycoplasma pneumoniae NOT DETECTED NOT DETECTED Final    Comment: Performed at [facility], [place] [address], [place], [place] [address]  Aerobic/Anaerobic Culture w Gram Stain (surgical/deep wound)     Status: None   Collection Time: 08/28/21  4:14 PM   Specimen: Abscess  Result Value Ref Range Status   Specimen Description ABSCESS  Final   Special Requests ABDOMEN IN PELVIS, SAMPLE 1  Final   Gram Stain   Final    ABUNDANT WBC PRESENT,BOTH PMN AND MONONUCLEAR FEW GRAM NEGATIVE RODS    Culture   Final    RARE ESCHERICHIA  COLI RARE CANDIDA ALBICANS MODERATE BACTEROIDES THETAIOTAOMICRON BETA LACTAMASE POSITIVE Performed at [facility], [place] [address], [place], [place] [address]    Report Status 09/01/2021 FINAL  Final   Organism ID, Bacteria ESCHERICHIA COLI  Final      Susceptibility   Escherichia coli - MIC*    AMPICILLIN <=2 SENSITIVE Sensitive     CEFAZOLIN <=4 SENSITIVE Sensitive     CEFEPIME <=0.12 SENSITIVE Sensitive     CEFTAZIDIME <=1 SENSITIVE Sensitive     CEFTRIAXONE <=0.25 SENSITIVE Sensitive     CIPROFLOXACIN <=0.25 SENSITIVE Sensitive     GENTAMICIN <=1 SENSITIVE Sensitive     IMIPENEM <=0.25 SENSITIVE Sensitive     TRIMETH/SULFA <=20 SENSITIVE Sensitive     AMPICILLIN/SULBACTAM <=2 SENSITIVE Sensitive     PIP/TAZO <=4 SENSITIVE Sensitive     * RARE ESCHERICHIA COLI  Aerobic/Anaerobic Culture w Gram Stain (surgical/deep wound)     Status: None   Collection Time: 08/28/21  5:15 PM   Specimen: Abscess  Result Value Ref Range Status   Specimen Description ABSCESS  Final   Special Requests LIVER SAMPLE 2  Final   Gram Stain   Final    ABUNDANT WBC PRESENT, PREDOMINANTLY PMN NO ORGANISMS SEEN    Culture   Final    No growth aerobically or anaerobically. Performed at [facility], [place] [address], [place], [place] [address]    Report Status 09/02/2021 FINAL  Final  Culture, blood (Routine X 2) w Reflex to ID Panel     Status: None   Collection Time: 08/29/21  6:47 PM   Specimen: BLOOD  Result Value Ref Range Status   Specimen Description BLOOD BLOOD RIGHT HAND  Final   Special Requests   Final    BOTTLES DRAWN AEROBIC AND ANAEROBIC Blood Culture adequate volume   Culture   Final    NO GROWTH 5 DAYS Performed at [facility], [place] [address], [place], [place] [address]    Report Status 09/03/2021 FINAL  Final  Culture, blood (Routine X 2) w Reflex to ID Panel     Status: None   Collection Time: 08/29/21  6:48 PM   Specimen: BLOOD  Result Value Ref Range  Status   Specimen Description BLOOD LEFT ANTECUBITAL  Final   Special Requests   Final    BOTTLES DRAWN AEROBIC AND ANAEROBIC Blood Culture adequate volume   Culture   Final    NO GROWTH 5 DAYS Performed at [facility], [place] [address], [place], [place] [address]    Report Status 09/03/2021  FINAL  Final  Culture, blood (Routine X 2) w Reflex to ID Panel     Status: None (Preliminary result)   Collection Time: 09/01/21  2:02 PM   Specimen: BLOOD RIGHT HAND  Result Value Ref Range Status   Specimen Description BLOOD RIGHT HAND  Final   Special Requests   Final    BOTTLES DRAWN AEROBIC AND ANAEROBIC Blood Culture results may not be optimal due to an inadequate volume of blood received in culture bottles   Culture   Final    NO GROWTH 4 DAYS Performed at [facility], [place] [address], [place], [place] [address]    Report Status PENDING  Incomplete  Culture, blood (Routine X 2) w Reflex to ID Panel     Status: None (Preliminary result)   Collection Time: 09/01/21  2:02 PM   Specimen: BLOOD RIGHT HAND  Result Value Ref Range Status   Specimen Description BLOOD RIGHT HAND  Final   Special Requests   Final    BOTTLES DRAWN AEROBIC AND ANAEROBIC Blood Culture adequate volume   Culture   Final    NO GROWTH 4 DAYS Performed at [facility], [place] [address] [place], [place] [address]    Report Status PENDING  Incomplete      Radiology Studies: No results found.    LOS: 9 days    Cordelia Poche, MD [facility] 09/05/2021, 8:01 PM   If 7PM-7AM, please contact night-coverage [email or web address]

## 2021-09-05 NOTE — Discharge Instructions (Addendum)
Kamas were in the hospital with a complicated diverticulitis infection. You have improved with antibiotics and antifungal medication. You had a drain placed to help with evacuating infectious material as well. Please follow-up with Infectious disease, General Surgery and Interventional radiology. Please continue your antibiotic and antifungal medication as prescribed.  Drain management: Flush drain daily with 5 mL of normal saline, record output daily, dressing changes every 2-3 days or earlier if soiled.

## 2021-09-05 NOTE — Discharge Summary (Signed)
Physician Discharge Summary   Patient: [name] [name] MRN: [id number] DOB: 1960/12/02  Admit date:     08/27/2021  Discharge date: 09/05/21  Discharge Physician: Cordelia Poche, MD   PCP: [facility]   Recommendations at discharge:  General surgery follow-up Interventional radiology follow-up for drain PCP follow-up for hospital follow-up Repeat BMP  Discharge Diagnoses: Principal Problem:   Perforated diverticulum Active Problems:   CAD (coronary artery disease)   HTN (hypertension)   Hyperlipidemia   History of gout   Prostate cancer ([place])   Severe obesity (BMI 35.0-39.9) with comorbidity ([place])   Liver abscess   Perforation of sigmoid colon due to diverticulitis  Resolved Problems:   * No resolved hospital problems. *  Hospital Course: [name] [name] is a 61 y.o. male with a history of CAD, hyperlipidemia, hypertension, obesity, OSA, prostate cancer. Patient presented secondary to fever, chills, poor appetite and was found to have ruptured diverticulum with associated abscess and incidental finding of multiple liver abscesses. Patient was initially empirically treated with Cefepime and Flagyl. General surgery consulted who recommended IR consult for percutaneous drain placement. Drains placed into diverticular abscess and liver abscess. Cultures significant for E. Coli, candida albicans and bacteroides. Patients discharge antibiotics included Augmentin and fluconazole. Patient to follow-up with general surgery, interventional radiology and infectious disease as an outpatient.  Assessment and Plan:  Acute perforated sigmoid diverticulitis with diverticular abscess CT abdomen/pelvis significant for distal descending colon and upper sigmoid colon diverticulitis with associated large, complex diverticular abscess. Patient started empirically on Cefepime and Flagyl. General surgery consulted with recommendations for IR drainage/percutaneous tube  placement. Percutaneous drainage and tube placement performed on 7/11. Wound culture (7/11) significant for E. Coli, candida albicans and bacteroides thetaiotaomicron. Patient transitioned to Zosyn initially for treatment before transition to Unasyn and micafungin for treatment per ID recommendations. Transition to Augmentin and fluconazole for 3 weeks. ID, general surgery and interventional radiology follow-up.  Liver abscesses Noted on MRI imaging. IR consulted and placed percutaneous drain. Wound culture without organisms on gram stain and no growth on culture.   Ascending aortic aneurysm Continue outpatient surveillance.   Chronic lower extremity edema Worsened during admission. Started on Lasix IV for management with improvement. LE venous duplex negative for acute or chronic DVT. Recommend conservative management.   Elevated creatinine Mild. Secondary to Lasix IV for management of edema. Lasix discontinued.   Primary hypertension Patient is on lisinopril, Toprol XL and hydrochlorothiazide as an outpatient.   Headache Resolved.   Hyponatremia Mild. Resolved.   Normocytic anemia Stable. Unsure of etiology. Recommend outpatient workup.   Gout flare Improving on colchicine. Continue colchicine.   Consultants: General surgery, interventional radiology, infectious disease Procedures performed: Image guided drain placement to pelvis (#1) and liver (#2) (7/11)  Disposition: Home Diet recommendation: Cardiac diet  DISCHARGE MEDICATION: Allergies as of 09/05/2021   No Known Allergies      Medication List     STOP taking these medications    doxycycline 100 MG tablet Commonly known as: VIBRA-TABS   ibuprofen 200 MG tablet Commonly known as: ADVIL   metoprolol tartrate 25 MG tablet Commonly known as: LOPRESSOR   pravastatin 40 MG tablet Commonly known as: PRAVACHOL   sulfamethoxazole-trimethoprim 800-160 MG tablet Commonly known as: BACTRIM DS   traMADol 50 MG  tablet Commonly known as: Ultram       TAKE these medications    allopurinol 100 MG tablet Commonly known as: ZYLOPRIM Take 100 mg by mouth  daily.   amoxicillin-clavulanate 875-125 MG tablet Commonly known as: AUGMENTIN Take 1 tablet by mouth 2 (two) times daily for 21 days.   colchicine 0.6 MG tablet Take 1 tablet (0.6 mg total) by mouth daily for 2 days.   fluconazole 200 MG tablet Commonly known as: DIFLUCAN Take 2 tablets (400 mg total) by mouth daily for 21 days.   hydrochlorothiazide 25 MG tablet Commonly known as: HYDRODIURIL TAKE 1 TABLET (25 MG TOTAL) BY MOUTH DAILY.   lisinopril 20 MG tablet Commonly known as: ZESTRIL TAKE 1 TABLET BY MOUTH EVERY DAY   meloxicam 15 MG tablet Commonly known as: MOBIC Take 15 mg by mouth daily as needed for pain.   metoprolol succinate 25 MG 24 hr tablet Commonly known as: TOPROL-XL Take 25 mg by mouth daily.   tadalafil 5 MG tablet Commonly known as: CIALIS Take 5 mg by mouth daily.        Follow-up Information     Dwan Bolt, MD Follow up.   Specialty: General Surgery Why: Our office is arranging follow up. Please call to confirm your appointment date and time. Please arrive 30 minutes prior to your appointment for paperwork. Please bring a copy of your photo ID and insurance card. Contact information: [place]. [address] [place] [address] [phone]                Discharge Exam: BP 138/77 (BP Location: Right Arm)   Pulse 70   Temp 98.7 F (37.1 C) (Oral)   Resp 17   Ht 5' 10.5" (1.791 m)   Wt 119.5 kg   SpO2 100%   BMI 37.27 kg/m   General exam: Appears calm and comfortable Respiratory system: Clear to auscultation. Respiratory effort normal. Cardiovascular system: S1 & S2 heard, RRR. No murmurs. Gastrointestinal system: Abdomen is nondistended, soft and nontender. Normal bowel sounds heard. Bilious drainage noted in drain bulb Central nervous system: Alert and oriented. No  focal neurological deficits. Musculoskeletal: No edema. No calf tenderness Skin: No cyanosis. No rashes Psychiatry: Judgement and insight appear normal. Mood & affect appropriate.   Condition at discharge: stable  The results of significant diagnostics from this hospitalization (including imaging, microbiology, ancillary and laboratory) are listed below for reference.   Imaging Studies: CT ABDOMEN PELVIS W CONTRAST  Result Date: 09/02/2021 CLINICAL DATA:  Inpatient. Follow-up perforated diverticulitis with liver abscesses and pelvic abscess status post percutaneous drain placement 5 days prior. EXAM: CT ABDOMEN AND PELVIS WITH CONTRAST TECHNIQUE: Multidetector CT imaging of the abdomen and pelvis was performed using the standard protocol following bolus administration of intravenous contrast. RADIATION DOSE REDUCTION: This exam was performed according to the departmental dose-optimization program which includes automated exposure control, adjustment of the mA and/or kV according to patient size and/or use of iterative reconstruction technique. CONTRAST:  150m OMNIPAQUE IOHEXOL 300 MG/ML  SOLN COMPARISON:  08/28/2021 CT abdomen/pelvis. FINDINGS: Lower chest: Mild platelike bibasilar atelectasis. Coronary atherosclerosis. Hepatobiliary: Numerous (greater than 10) rim enhancing abscesses scattered throughout the liver. Percutaneous pigtail drain terminates in the anterior segment 3 left liver, with no definite residual measurable abscess in this location. A few small bubbles of free air anterior to the left liver are compatible with instrumentation. The other liver abscesses are stable to slightly decreased. Representative lobulated 9.0 x 6.2 cm segment 4 left liver abscess (series 3/image 16), previously 8.7 x 6.6 cm, overall stable. Representative 4.5 x 2.6 cm segment 7 right liver abscess (series 3/image 22), previously 4.6  x 2.9 cm, slightly decreased. Representative 1.3 x 0.9 cm inferior right liver  abscess (series 3/image 39), mildly decreased from 1.7 x 1.2 cm. No appreciable new or enlarging liver lesions. Normal gallbladder with no radiopaque cholelithiasis. No biliary ductal dilatation. Pancreas: Normal, with no mass or duct dilation. Spleen: Normal size. No mass. Adrenals/Urinary Tract: Normal adrenals. A few scattered punctate 1-2 mm nonobstructing stones throughout both kidneys. No hydronephrosis. No renal masses. Interval percutaneous pigtail drain placement within the collection adherent to the anterior left bladder, which has nearly resolved, currently 1.5 x 1.1 cm with internal gas (series 3/image 86), previously 6.1 x 4.3 cm. Foci of gas remain along a fistula tract from the mid sigmoid colon to the bladder. Persistent gas in the nondependent bladder wall. Left anterior bladder wall thickening is improved. Stomach/Bowel: Normal non-distended stomach. Normal caliber small bowel with no small bowel wall thickening. Normal appendix. Oral contrast transits to the rectum. Moderate sigmoid diverticulosis. Similar wall thickening throughout the proximal to mid sigmoid colon with mild pericolonic fat stranding, unchanged. No new sites of large bowel wall thickening. Vascular/Lymphatic: Atherosclerotic nonaneurysmal abdominal aorta. Patent portal, splenic and renal veins. Mildly enlarged 1.0 cm porta hepatis node (series 3/image 32), stable. No additional pathologically enlarged lymph nodes in the abdomen or pelvis. Reproductive: Apparent prostatectomy. Other: Similar small volume ascites in anterior pelvis. No new fluid collections. Musculoskeletal: No aggressive appearing focal osseous lesions. 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 Moderate degenerative disc disease throughout the remaining visualized thoracolumbar spine. IMPRESSION: 1. Interval percutaneous pigtail drain placement within the anterior segment 3 left liver abscess, with  no residual measurable abscess in this location. The numerous other liver abscesses are stable to slightly decreased. 2. Interval percutaneous drain placement within the collection adherent to the anterior left bladder, which has nearly resolved, currently 1.5 x 1.1 cm with internal gas. Persistent gas within a soft tissue tract connecting the mid sigmoid colon to the bladder suspicious for colovesical fistula. Left anterior bladder wall thickening is improved. Persistent gas in the nondependent bladder. 3. Stable mild porta hepatis adenopathy, nonspecific, probably reactive. 4. Nonobstructing bilateral nephrolithiasis. 5. 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 6. Aortic Atherosclerosis (ICD10-I70.0). Electronically Signed   By: Ilona Sorrel M.D.   On: 09/02/2021 14:29   DG CHEST PORT 1 VIEW  Result Date: 08/31/2021 CLINICAL DATA:  Ongoing fever and productive cough for 16 days EXAM: PORTABLE CHEST - 1 VIEW COMPARISON:  08/27/2021 FINDINGS: Cardiomediastinal silhouette and pulmonary vasculature are within normal limits. Lungs are clear. Moderate right glenohumeral osteoarthrosis. IMPRESSION: No acute cardiopulmonary process. Electronically Signed   By: Miachel Roux M.D.   On: 08/31/2021 09:29   CT HEAD WO CONTRAST (5MM)  Result Date: 08/30/2021 CLINICAL DATA:  Headache EXAM: CT HEAD WITHOUT CONTRAST TECHNIQUE: Contiguous axial images were obtained from the base of the skull through the vertex without intravenous contrast. RADIATION DOSE REDUCTION: This exam was performed according to the departmental dose-optimization program which includes automated exposure control, adjustment of the mA and/or kV according to patient size and/or use of iterative reconstruction technique. COMPARISON:  None Available. FINDINGS: Brain: No evidence of acute infarction, hemorrhage, hydrocephalus, extra-axial collection or mass lesion/mass effect. Vascular: No  hyperdense vessel or unexpected calcification. Skull: Normal. Negative for fracture or focal lesion. Sinuses/Orbits: No acute finding. Other: None. IMPRESSION: No acute intracranial abnormality. Electronically Signed   By: Denny Peon  Strickland M.D.   On: 08/30/2021 14:24   VAS [place] LOWER EXTREMITY VENOUS (DVT)  Result Date: 08/29/2021  Lower Venous DVT Study Patient Name:  ALEGANDRO MACNAUGHTON  Date of Exam:   08/29/2021 Medical Rec #: [id number]          Accession #:    [id number] Date of Birth: 22-Nov-1960         Patient Gender: M Patient Age:   89 years Exam Location:  [facility] Procedure:      VAS [place] LOWER EXTREMITY VENOUS (DVT) Referring Phys: PRANAV PATEL --------------------------------------------------------------------------------  Indications: Edema.  Performing Technologist: Bobetta Lime BS, RVT  Examination Guidelines: A complete evaluation includes B-mode imaging, spectral Doppler, color Doppler, and power Doppler as needed of all accessible portions of each vessel. Bilateral testing is considered an integral part of a complete examination. Limited examinations for reoccurring indications may be performed as noted. The reflux portion of the exam is performed with the patient in reverse Trendelenburg.  +---------+---------------+---------+-----------+----------+--------------+ RIGHT    CompressibilityPhasicitySpontaneityPropertiesThrombus Aging +---------+---------------+---------+-----------+----------+--------------+ CFV      Full           Yes      Yes                                 +---------+---------------+---------+-----------+----------+--------------+ SFJ      Full                                                        +---------+---------------+---------+-----------+----------+--------------+ FV Prox  Full                                                        +---------+---------------+---------+-----------+----------+--------------+ FV Mid   Full                                                         +---------+---------------+---------+-----------+----------+--------------+ FV DistalFull                                                        +---------+---------------+---------+-----------+----------+--------------+ PFV      Full                                                        +---------+---------------+---------+-----------+----------+--------------+ POP      Full           Yes      Yes                                 +---------+---------------+---------+-----------+----------+--------------+ PTV  Full                                                        +---------+---------------+---------+-----------+----------+--------------+ PERO     Full                                                        +---------+---------------+---------+-----------+----------+--------------+   +---------+---------------+---------+-----------+----------+--------------+ LEFT     CompressibilityPhasicitySpontaneityPropertiesThrombus Aging +---------+---------------+---------+-----------+----------+--------------+ CFV      Full           Yes      Yes                                 +---------+---------------+---------+-----------+----------+--------------+ SFJ      Full                                                        +---------+---------------+---------+-----------+----------+--------------+ FV Prox  Full                                                        +---------+---------------+---------+-----------+----------+--------------+ FV Mid   Full                                                        +---------+---------------+---------+-----------+----------+--------------+ FV DistalFull                                                        +---------+---------------+---------+-----------+----------+--------------+ PFV      Full                                                         +---------+---------------+---------+-----------+----------+--------------+ POP      Full           Yes      Yes                                 +---------+---------------+---------+-----------+----------+--------------+ PTV      Full                                                        +---------+---------------+---------+-----------+----------+--------------+  PERO     Full                                                        +---------+---------------+---------+-----------+----------+--------------+     Summary: BILATERAL: - No evidence of deep vein thrombosis seen in the lower extremities, bilaterally. -No evidence of popliteal cyst, bilaterally.   *See table(s) above for measurements and observations. Electronically signed by Harold Barban MD on 08/29/2021 at 9:54:44 PM.    Final    CT IMAGE GUIDED DRAINAGE BY PERCUTANEOUS CATHETER  Result Date: 08/29/2021 INDICATION: 61 year old male referred for percutaneous drainage of liver abscess and pelvic abscess, diverticulitis EXAM: IMAGE GUIDED DRAINAGE OF LIVER ABSCESS IMAGE GUIDED DRAINAGE OF PELVIC ABSCESS TECHNIQUE: Multidetector CT imaging of the abdomen was performed following the standard protocol without IV contrast. RADIATION DOSE REDUCTION: This exam was performed according to the departmental dose-optimization program which includes automated exposure control, adjustment of the mA and/or kV according to patient size and/or use of iterative reconstruction technique. MEDICATIONS: The patient is currently admitted to the hospital and receiving intravenous antibiotics. The antibiotics were administered within an appropriate time frame prior to the initiation of the procedure. ANESTHESIA/SEDATION: Moderate (conscious) sedation was employed during this procedure. A total of Versed 3.0 mg and Fentanyl 150 mcg was administered intravenously by the radiology nurse. Total intra-service moderate Sedation Time: 37 minutes. The patient's  level of consciousness and vital signs were monitored continuously by radiology nursing throughout the procedure under my direct supervision. COMPLICATIONS: None PROCEDURE: Informed written consent was obtained from the patient after a thorough discussion of the procedural risks, benefits and alternatives. All questions were addressed. Maximal Sterile Barrier Technique was utilized including caps, mask, sterile gowns, sterile gloves, sterile drape, hand hygiene and skin antiseptic. A timeout was performed prior to the initiation of the procedure. Patient positioned supine position on CT gantry table. Scout CT was acquired for planning purposes. Ultrasound images of the liver were acquired for planning purposes. The patient was then prepped and draped in the usual sterile fashion, in the low anterior pelvic region as well as the subcostal region in the upper abdomen. We initiated placement of the pelvic drain. Once we acquired appropriate trajectory for needle placement, 1% lidocaine was used for local anesthesia. Small stab incision was made with 11 blade scalpel a Yueh needle was advanced under CT guidance into the abscess anterior to the urinary bladder. Once we confirmed the catheter position modified Seldinger technique was then used to place a 10 [place] drain into the abscess. Aspiration of approximately 10 cc of frankly purulent material was performed. Catheter was sutured in position and attached to bulb suction. Final image was acquired. Sample was sent for analysis, from what we labeled as drain 1. Ultrasound guidance was then used to place a 12 [place] drain with trocar technique into the anterior located abscess within segment 3 of the liver the more cranial located abscess cavities were not as well visualized given the position under the diaphragm and behind the ribs. Approximately 15 cc of purulent material was aspirated with placement of the 12 French drain. Drain was sutured in position and attached to  bulb suction. Final image was acquired. Patient tolerated the procedure well and remained hemodynamically stable throughout. No complications were encountered and no significant blood loss. IMPRESSION: Status post image  guided placement of pelvic drain into super cystic abscess, as well as liver drain into segment 3 liver abscess. Culture was sent from both sites. Signed, Dulcy Fanny. Nadene Rubins, RPVI [facility] Radiology Electronically Signed   By: Corrie Mckusick D.O.   On: 08/29/2021 09:53   CT IMAGE GUIDED DRAINAGE PERCUT CATH  PERITONEAL RETROPERIT  Result Date: 08/29/2021 INDICATION: 60 year old male referred for percutaneous drainage of liver abscess and pelvic abscess, diverticulitis EXAM: IMAGE GUIDED DRAINAGE OF LIVER ABSCESS IMAGE GUIDED DRAINAGE OF PELVIC ABSCESS TECHNIQUE: Multidetector CT imaging of the abdomen was performed following the standard protocol without IV contrast. RADIATION DOSE REDUCTION: This exam was performed according to the departmental dose-optimization program which includes automated exposure control, adjustment of the mA and/or kV according to patient size and/or use of iterative reconstruction technique. MEDICATIONS: The patient is currently admitted to the hospital and receiving intravenous antibiotics. The antibiotics were administered within an appropriate time frame prior to the initiation of the procedure. ANESTHESIA/SEDATION: Moderate (conscious) sedation was employed during this procedure. A total of Versed 3.0 mg and Fentanyl 150 mcg was administered intravenously by the radiology nurse. Total intra-service moderate Sedation Time: 37 minutes. The patient's level of consciousness and vital signs were monitored continuously by radiology nursing throughout the procedure under my direct supervision. COMPLICATIONS: None PROCEDURE: Informed written consent was obtained from the patient after a thorough discussion of the  procedural risks, benefits and alternatives. All questions were addressed. Maximal Sterile Barrier Technique was utilized including caps, mask, sterile gowns, sterile gloves, sterile drape, hand hygiene and skin antiseptic. A timeout was performed prior to the initiation of the procedure. Patient positioned supine position on CT gantry table. Scout CT was acquired for planning purposes. Ultrasound images of the liver were acquired for planning purposes. The patient was then prepped and draped in the usual sterile fashion, in the low anterior pelvic region as well as the subcostal region in the upper abdomen. We initiated placement of the pelvic drain. Once we acquired appropriate trajectory for needle placement, 1% lidocaine was used for local anesthesia. Small stab incision was made with 11 blade scalpel a Yueh needle was advanced under CT guidance into the abscess anterior to the urinary bladder. Once we confirmed the catheter position modified Seldinger technique was then used to place a 10 [place] drain into the abscess. Aspiration of approximately 10 cc of frankly purulent material was performed. Catheter was sutured in position and attached to bulb suction. Final image was acquired. Sample was sent for analysis, from what we labeled as drain 1. Ultrasound guidance was then used to place a 12 [place] drain with trocar technique into the anterior located abscess within segment 3 of the liver the more cranial located abscess cavities were not as well visualized given the position under the diaphragm and behind the ribs. Approximately 15 cc of purulent material was aspirated with placement of the 12 French drain. Drain was sutured in position and attached to bulb suction. Final image was acquired. Patient tolerated the procedure well and remained hemodynamically stable throughout. No complications were encountered and no significant blood loss. IMPRESSION: Status post image guided placement of pelvic drain into  super cystic abscess, as well as liver drain into segment 3 liver abscess. Culture was sent from both sites. Signed, Dulcy Fanny. Nadene Rubins, RPVI [facility] Electronically Signed   By: Corrie Mckusick D.O.   On: 08/29/2021 09:53   CT ABDOMEN  PELVIS W CONTRAST  Result Date: 08/28/2021 CLINICAL DATA:  Hepatic lesions on recent MRI. EXAM: CT ABDOMEN AND PELVIS WITH CONTRAST TECHNIQUE: Multidetector CT imaging of the abdomen and pelvis was performed using the standard protocol following bolus administration of intravenous contrast. RADIATION DOSE REDUCTION: This exam was performed according to the departmental dose-optimization program which includes automated exposure control, adjustment of the mA and/or kV according to patient size and/or use of iterative reconstruction technique. CONTRAST:  124m OMNIPAQUE IOHEXOL 350 MG/ML SOLN COMPARISON:  MRI 08/28/2021 FINDINGS: Lower chest: Streaky areas of subsegmental atelectasis but no pleural effusions or pulmonary infiltrates. The heart is within normal limits in size. No pericardial effusion. Age advanced coronary artery calcifications are noted. Hepatobiliary: Numerous rim enhancing low-attenuation lesions throughout the liver most likely reflecting hepatic abscesses. No intrahepatic biliary dilatation. The portal and hepatic veins are patent. No common bile duct dilatation. Pancreas: No mass, inflammation or ductal dilatation. Spleen: Moderate splenomegaly. The spleen measures 16.5 x 13.4 x 11 cm. No splenic lesions. The splenic vein is patent. Adrenals/Urinary Tract: The adrenal glands are normal. Small renal calculi but no renal lesions or hydronephrosis. High attenuation material in the bladder could be related to a prior CT scan with contrast. Stomach/Bowel: The stomach, duodenum and small are unremarkable. Terminal ileum and appendix are normal. Distal descending and upper sigmoid colon diverticulitis  with a large complex diverticular abscess or which is along the anterior aspect of the bladder and could potentially involve the bladder wall. This abscess measures approximately 6.1 x 4.8 x 4.3 cm. The remainder of the colon demonstrates diverticulosis but no mass or other areas of diverticulitis. Vascular/Lymphatic: Age advanced atherosclerotic calcification involving the aorta and branch vessels but no aneurysm or dissection. Numerous small mesenteric and retroperitoneal lymph nodes are likely reactive. Reproductive: Status post prostatectomy. Other: Mild mesenteric and retroperitoneal edema along with a small amount of free fluid mainly in the pelvis. Musculoskeletal: Bilateral pars defects at L5 with a grade 1 spondylolisthesis and advanced degenerative disc disease at L5-S1. No acute bony findings. IMPRESSION: 1. Numerous rim enhancing low-attenuation lesions throughout the liver most likely reflecting hepatic abscesses. 2. Distal descending colon and upper sigmoid colon diverticulitis and large (6.1 x 4.8 x 4.3 cm) complex diverticular abscess along the anterior aspect of the bladder. 3. Moderate splenomegaly. 4. Age advanced atherosclerotic calcification involving the aorta and branch vessels and coronary arteries. 5. Status post prostatectomy. Aortic Atherosclerosis (ICD10-I70.0). Electronically Signed   By: PMarijo SanesM.D.   On: 08/28/2021 10:26   CT CHEST WO CONTRAST  Result Date: 08/28/2021 CLINICAL DATA:  Abnormal xray - lung opacity/opacities Liver masses, suspected malignancy. EXAM: CT CHEST WITHOUT CONTRAST TECHNIQUE: Multidetector CT imaging of the chest was performed following the standard protocol without IV contrast. RADIATION DOSE REDUCTION: This exam was performed according to the departmental dose-optimization program which includes automated exposure control, adjustment of the mA and/or kV according to patient size and/or use of iterative reconstruction technique. COMPARISON:   Correlation is made with a chest x-ray from yesterday and MRI of the abdomen performed on the same day earlier FINDINGS: Cardiovascular: Ascending thoracic aorta measures 4 cm in greatest dimension. Moderate atheromatous calcifications of the coronary arteries. Main pulmonary artery measures 3.2 cm and is dilated. Normal heart size. No pericardial effusion. Mediastinum/Nodes: No significantly enlarged mediastinal or axillary lymph nodes. Thyroid gland, trachea, and esophagus demonstrate no significant findings. Lungs/Pleura: No focal consolidation, pulmonary mass or discrete nodule seen. Mild bibasilar pleural pulmonary scarring. No pleural effusion  or pneumothorax. Upper Abdomen: Again seen are several hypodense lesions in the liver and some of the lesions appear conglomerated. Spleen, pancreas, adrenals have a normal appearance. Musculoskeletal: Mild-to-moderate thoracolumbar spondylosis with prominent anterior marginal osteophytes/syndesmophytes. IMPRESSION: 1. Ascending thoracic aorta measures 4 cm in greatest dimension. Pulmonary artery measures 3.2 cm and may reflect pulmonary hypertension. Recommend annual imaging followup by CTA or MRA. This recommendation follows 2010 ACCF/AHA/AATS/ACR/ASA/SCA/SCAI/SIR/STS/SVM Guidelines for the Diagnosis and Management of Patients with Thoracic Aortic Disease. Circulation. 2010; 121: N470-J628. Aortic aneurysm NOS (ICD10-I71.9) 2. No pulmonary mass or discrete nodule seen. No significant mediastinal/hilar lymphadenopathy. Mild bibasilar pleural pulmonary scarring. 3. There are several hypodense lesions in the liver with a differential considerations of hepatic abscesses versus cystic/necrotic metastasis (please refer to the MRI of the abdomen for detailed description). Electronically Signed   By: Frazier Richards M.D.   On: 08/28/2021 08:37   MR ABDOMEN W WO CONTRAST  Result Date: 08/28/2021 CLINICAL DATA:  61 year old male history of recent fever, cough and body ache.  History of malignancy presenting with multiple liver lesions. EXAM: MRI ABDOMEN WITHOUT AND WITH CONTRAST TECHNIQUE: Multiplanar multisequence MR imaging of the abdomen was performed both before and after the administration of intravenous contrast. CONTRAST:  78m GADAVIST GADOBUTROL 1 MMOL/ML IV SOLN COMPARISON:  No priors. FINDINGS: Lower chest: Unremarkable. Hepatobiliary: There are numerous T1 hypointense, T2 hyperintense lesions scattered throughout the liver which demonstrate peripheral hypervascularity adjacent to the lesions, but no definite internal enhancement on post gadolinium imaging. These lesions the majority of these lesions appear to restrict diffusion. The largest of these lesions measure up to 5.0 x 3.7 x 4.3 cm in segment 4A of the liver (axial image 27 of series 19 and coronal image 66 of series 23). No intra or extrahepatic biliary ductal dilatation. Gallbladder is unremarkable in appearance. Pancreas: No pancreatic mass. No pancreatic ductal dilatation. No pancreatic or peripancreatic fluid collections or inflammatory changes. Spleen:  Unremarkable. Adrenals/Urinary Tract: Bilateral kidneys and adrenal glands are normal in appearance. No hydroureteronephrosis in the visualized portions of the abdomen. Stomach/Bowel: Visualized portions numerous colonic diverticulae are noted in the visualized portions of the colon. Otherwise, unremarkable. Vascular/Lymphatic: No aneurysm identified in the visualized abdominal vasculature. No no lymphadenopathy noted in the abdomen. Other: No significant volume of ascites noted in the visualized portions of the peritoneal cavity. Musculoskeletal: No aggressive appearing osseous lesions are noted in the visualized portions of the skeleton. IMPRESSION: 1. Multiple aggressive appearing hepatic lesions with indeterminate imaging characteristics. Primary differential considerations include multiple hepatic abscesses, however, the possibility of multifocal  cystic/necrotic metastasis is not excluded. Further clinical evaluation is recommended. Electronically Signed   By: DVinnie LangtonM.D.   On: 08/28/2021 05:42   DG Chest 2 View  Result Date: 08/27/2021 CLINICAL DATA:  Provided history: Possible sepsis. EXAM: CHEST - 2 VIEW COMPARISON:  Prior chest radiographs 04/09/2004 and earlier. FINDINGS: Shallow inspiration radiograph. Heart size within normal limits. Mild ill-defined opacity at both lung bases with an appearance most suggestive of atelectasis. No evidence of pleural effusion or pneumothorax. No acute bony abnormality identified. IMPRESSION: Shallow inspiration radiograph. Mild ill-defined opacity within both lung bases, with an appearance favoring atelectasis. Electronically Signed   By: KKellie SimmeringD.O.   On: 08/27/2021 16:59    Microbiology: Results for orders placed or performed during the hospital encounter of 08/27/21  Urine Culture     Status: None   Collection Time: 08/27/21  3:48 PM   Specimen: In/Out Cath Urine  Result  Value Ref Range Status   Specimen Description IN/OUT CATH URINE  Final   Special Requests NONE  Final   Culture   Final    NO GROWTH Performed at [facility], [address] [place], [place] [address]    Report Status 08/28/2021 FINAL  Final  Blood Culture (routine x 2)     Status: None   Collection Time: 08/27/21  4:22 PM   Specimen: BLOOD  Result Value Ref Range Status   Specimen Description BLOOD BLOOD LEFT FOREARM  Final   Special Requests   Final    BOTTLES DRAWN AEROBIC AND ANAEROBIC Blood Culture results may not be optimal due to an inadequate volume of blood received in culture bottles   Culture   Final    NO GROWTH 5 DAYS Performed at [facility], [place] [address], [place], [place] [address]    Report Status 09/01/2021 FINAL  Final  Blood Culture (routine x 2)     Status: None   Collection Time: 08/27/21  6:24 PM   Specimen: BLOOD RIGHT FOREARM  Result Value Ref Range Status    Specimen Description BLOOD RIGHT FOREARM  Final   Special Requests   Final    BOTTLES DRAWN AEROBIC AND ANAEROBIC Blood Culture results may not be optimal due to an inadequate volume of blood received in culture bottles   Culture   Final    NO GROWTH 5 DAYS Performed at [facility], [place] [address], [place], [place] [address]    Report Status 09/01/2021 FINAL  Final  Respiratory (~20 pathogens) panel by PCR     Status: None   Collection Time: 08/28/21  3:42 AM   Specimen: Nasopharyngeal Swab; Respiratory  Result Value Ref Range Status   Adenovirus NOT DETECTED NOT DETECTED Final   Coronavirus 229E NOT DETECTED NOT DETECTED Final    Comment: (NOTE) The Coronavirus on the Respiratory Panel, DOES NOT test for the novel  Coronavirus (2019 nCoV)    Coronavirus HKU1 NOT DETECTED NOT DETECTED Final   Coronavirus NL63 NOT DETECTED NOT DETECTED Final   Coronavirus OC43 NOT DETECTED NOT DETECTED Final   Metapneumovirus NOT DETECTED NOT DETECTED Final   Rhinovirus / Enterovirus NOT DETECTED NOT DETECTED Final   Influenza A NOT DETECTED NOT DETECTED Final   Influenza B NOT DETECTED NOT DETECTED Final   Parainfluenza Virus 1 NOT DETECTED NOT DETECTED Final   Parainfluenza Virus 2 NOT DETECTED NOT DETECTED Final   Parainfluenza Virus 3 NOT DETECTED NOT DETECTED Final   Parainfluenza Virus 4 NOT DETECTED NOT DETECTED Final   Respiratory Syncytial Virus NOT DETECTED NOT DETECTED Final   Bordetella pertussis NOT DETECTED NOT DETECTED Final   Bordetella Parapertussis NOT DETECTED NOT DETECTED Final   Chlamydophila pneumoniae NOT DETECTED NOT DETECTED Final   Mycoplasma pneumoniae NOT DETECTED NOT DETECTED Final    Comment: Performed at [facility], [place]. [address], [place], [place] [address]  Aerobic/Anaerobic Culture w Gram Stain (surgical/deep wound)     Status: None   Collection Time: 08/28/21  4:14 PM   Specimen: Abscess  Result Value Ref Range Status   Specimen Description  ABSCESS  Final   Special Requests ABDOMEN IN PELVIS, SAMPLE 1  Final   Gram Stain   Final    ABUNDANT WBC PRESENT,BOTH PMN AND MONONUCLEAR FEW GRAM NEGATIVE RODS    Culture   Final    RARE ESCHERICHIA COLI RARE CANDIDA ALBICANS MODERATE BACTEROIDES THETAIOTAOMICRON BETA  LACTAMASE POSITIVE Performed at [facility], [place] [address], [place], [place] [address]    Report Status 09/01/2021 FINAL  Final   Organism ID, Bacteria ESCHERICHIA COLI  Final      Susceptibility   Escherichia coli - MIC*    AMPICILLIN <=2 SENSITIVE Sensitive     CEFAZOLIN <=4 SENSITIVE Sensitive     CEFEPIME <=0.12 SENSITIVE Sensitive     CEFTAZIDIME <=1 SENSITIVE Sensitive     CEFTRIAXONE <=0.25 SENSITIVE Sensitive     CIPROFLOXACIN <=0.25 SENSITIVE Sensitive     GENTAMICIN <=1 SENSITIVE Sensitive     IMIPENEM <=0.25 SENSITIVE Sensitive     TRIMETH/SULFA <=20 SENSITIVE Sensitive     AMPICILLIN/SULBACTAM <=2 SENSITIVE Sensitive     PIP/TAZO <=4 SENSITIVE Sensitive     * RARE ESCHERICHIA COLI  Aerobic/Anaerobic Culture w Gram Stain (surgical/deep wound)     Status: None   Collection Time: 08/28/21  5:15 PM   Specimen: Abscess  Result Value Ref Range Status   Specimen Description ABSCESS  Final   Special Requests LIVER SAMPLE 2  Final   Gram Stain   Final    ABUNDANT WBC PRESENT, PREDOMINANTLY PMN NO ORGANISMS SEEN    Culture   Final    No growth aerobically or anaerobically. Performed at [facility], [place] [address], [place], [place] [address]    Report Status 09/02/2021 FINAL  Final  Culture, blood (Routine X 2) w Reflex to ID Panel     Status: None   Collection Time: 08/29/21  6:47 PM   Specimen: BLOOD  Result Value Ref Range Status   Specimen Description BLOOD BLOOD RIGHT HAND  Final   Special Requests   Final    BOTTLES DRAWN AEROBIC AND ANAEROBIC Blood Culture adequate volume   Culture   Final    NO GROWTH 5 DAYS Performed at [facility], [place] [address], [place],  [place] [address]    Report Status 09/03/2021 FINAL  Final  Culture, blood (Routine X 2) w Reflex to ID Panel     Status: None   Collection Time: 08/29/21  6:48 PM   Specimen: BLOOD  Result Value Ref Range Status   Specimen Description BLOOD LEFT ANTECUBITAL  Final   Special Requests   Final    BOTTLES DRAWN AEROBIC AND ANAEROBIC Blood Culture adequate volume   Culture   Final    NO GROWTH 5 DAYS Performed at [facility], [place] [address], [place], [place] [address]    Report Status 09/03/2021 FINAL  Final  Culture, blood (Routine X 2) w Reflex to ID Panel     Status: None (Preliminary result)   Collection Time: 09/01/21  2:02 PM   Specimen: BLOOD RIGHT HAND  Result Value Ref Range Status   Specimen Description BLOOD RIGHT HAND  Final   Special Requests   Final    BOTTLES DRAWN AEROBIC AND ANAEROBIC Blood Culture results may not be optimal due to an inadequate volume of blood received in culture bottles   Culture   Final    NO GROWTH 4 DAYS Performed at [facility], [place] [address] [place], [place] [address]    Report Status PENDING  Incomplete  Culture, blood (Routine X 2) w Reflex to ID Panel     Status: None (Preliminary result)   Collection Time: 09/01/21  2:02 PM   Specimen: BLOOD RIGHT HAND  Result Value Ref Range Status   Specimen Description BLOOD  RIGHT HAND  Final   Special Requests   Final    BOTTLES DRAWN AEROBIC AND ANAEROBIC Blood Culture adequate volume   Culture   Final    NO GROWTH 4 DAYS Performed at [facility], [address], [place], [place] [address]    Report Status PENDING  Incomplete    Labs: CBC: Recent Labs  Lab 09/01/21 0106 09/02/21 0929 09/03/21 0121 09/04/21 0155 09/05/21 0148  WBC 11.9* 13.2* 13.8* 11.4* 9.9  NEUTROABS 9.9* 11.3* 11.7* 9.0* 7.7  HGB 11.0* 11.3* 11.0* 11.0* 10.8*  HCT 32.7* 34.2* 33.5* 32.8* 32.2*  MCV 92.9 93.4 92.8 92.7 93.3  PLT 314 367 360 390 297   Basic Metabolic Panel: Recent Labs  Lab  08/30/21 0101 08/31/21 0113 09/01/21 0106 09/02/21 0929 09/03/21 0121 09/04/21 0155 09/05/21 0148  NA 135 133* 133* 135 133* 134* 135  K 4.3 4.0 3.8 4.1 4.1 4.1 4.0  CL 102 101 101 102 103 104 102  CO2 22 22 21* 22 20* 22 23  GLUCOSE 158* 136* 139* 128* 159* 122* 129*  BUN '18 15 12 9 '$ 7* 8 11  CREATININE 1.27* 1.63* 1.21 1.03 1.09 1.09 1.25*  CALCIUM 8.4* 8.0* 8.2* 8.2* 8.2* 8.2* 8.2*  MG 1.9 2.0 1.9 2.0  --   --   --   PHOS  --   --   --  3.2  --   --   --    Liver Function Tests: Recent Labs  Lab 08/30/21 0101 08/31/21 0113 09/01/21 0106  AST 45* 33 41  ALT 54* 42 41  ALKPHOS 137* 123 110  BILITOT 1.3* 0.9 0.9  PROT 7.3 7.2 6.8  ALBUMIN 2.1* 2.0* 2.1*   Discharge time spent: 35 minutes.  Signed: Cordelia Poche, MD [facility] 09/05/2021

## 2021-09-05 NOTE — Progress Notes (Signed)
Able to teach wife and pt how to care for the JP drain. The wife flush the JP with 5 cc and empty the drain.

## 2021-09-05 NOTE — Progress Notes (Signed)
Subjective: CC: Doing well. No abdominal pain yesterday or today. Not using PRN pain medication. Tolerating soft diet (didn't like the french toast). No n/v. Had ~6 liquid stools in the last 24 hours. Mobilizing. Voiding.   Objective: Vital signs in last 24 hours: Temp:  [99.1 F (37.3 C)-99.9 F (37.7 C)] 99.1 F (37.3 C) (07/19 0440) Pulse Rate:  [72-79] 72 (07/19 0440) Resp:  [17-18] 18 (07/19 0440) BP: (122-128)/(69-74) 128/73 (07/19 0440) SpO2:  [96 %-98 %] 97 % (07/19 0440) Last BM Date : 09/04/21  Intake/Output from previous day: 07/18 0701 - 07/19 0700 In: 2033.4 [P.O.:480; IV Piggyback:1548.4] Out: 1290 [Urine:1250; Drains:40] Intake/Output this shift: No intake/output data recorded.  PE: Gen:  Alert, NAD, pleasant, sitting up in chair Pulm: Rate and effort normal Abd: Soft, ND, he is completely NT on my exam, small umbilical hernia that is reducible, +BS,  Upper IR drain site with dressing in place, cdi Inferior IR drain - 40cc/24 hours, milky bile tinged fluid in bulb Psych: A&Ox3   Lab Results:  Recent Labs    09/04/21 0155 09/05/21 0148  WBC 11.4* 9.9  HGB 11.0* 10.8*  HCT 32.8* 32.2*  PLT 390 349   BMET Recent Labs    09/04/21 0155 09/05/21 0148  NA 134* 135  K 4.1 4.0  CL 104 102  CO2 22 23  GLUCOSE 122* 129*  BUN 8 11  CREATININE 1.09 1.25*  CALCIUM 8.2* 8.2*   PT/INR No results for input(s): "LABPROT", "INR" in the last 72 hours. CMP     Component Value Date/Time   NA 135 09/05/2021 0148   K 4.0 09/05/2021 0148   CL 102 09/05/2021 0148   CO2 23 09/05/2021 0148   GLUCOSE 129 (H) 09/05/2021 0148   BUN 11 09/05/2021 0148   CREATININE 1.25 (H) 09/05/2021 0148   CALCIUM 8.2 (L) 09/05/2021 0148   PROT 6.8 09/01/2021 0106   ALBUMIN 2.1 (L) 09/01/2021 0106   AST 41 09/01/2021 0106   ALT 41 09/01/2021 0106   ALKPHOS 110 09/01/2021 0106   BILITOT 0.9 09/01/2021 0106   GFRNONAA >60 09/05/2021 0148   GFRAA 55 (L) 10/30/2019  1747   Lipase  No results found for: "LIPASE"  Studies/Results: No results found.  Anti-infectives: Anti-infectives (From admission, onward)    Start     Dose/Rate Route Frequency Ordered Stop   09/03/21 1530  micafungin (MYCAMINE) 100 mg in sodium chloride 0.9 % 100 mL IVPB        100 mg 105 mL/hr over 1 Hours Intravenous Every 24 hours 09/03/21 1439     09/01/21 2000  Ampicillin-Sulbactam (UNASYN) 3 g in sodium chloride 0.9 % 100 mL IVPB        3 g 200 mL/hr over 30 Minutes Intravenous Every 6 hours 09/01/21 1859     08/29/21 1200  piperacillin-tazobactam (ZOSYN) IVPB 3.375 g  Status:  Discontinued        3.375 g 12.5 mL/hr over 240 Minutes Intravenous Every 8 hours 08/29/21 0928 09/01/21 1820   08/28/21 0400  ceFEPIme (MAXIPIME) 2 g in sodium chloride 0.9 % 100 mL IVPB  Status:  Discontinued        2 g 200 mL/hr over 30 Minutes Intravenous Every 8 hours 08/27/21 2103 08/29/21 0844   08/27/21 2145  metroNIDAZOLE (FLAGYL) IVPB 500 mg  Status:  Discontinued        500 mg 100 mL/hr over 60 Minutes Intravenous Every 12 hours  08/27/21 2142 08/29/21 0844   08/27/21 1730  ceFEPIme (MAXIPIME) 2 g in sodium chloride 0.9 % 100 mL IVPB        2 g 200 mL/hr over 30 Minutes Intravenous  Once 08/27/21 1721 08/27/21 1948   08/27/21 1730  metroNIDAZOLE (FLAGYL) IVPB 500 mg        500 mg 100 mL/hr over 60 Minutes Intravenous  Once 08/27/21 1721 08/27/21 2110        Assessment/Plan Diverticulitis with abscess and liver lesions/abscess  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 Liver drain removed by IR 7/17. Cx's from pelvic abscess w/ rare E. Coli, candida albicans, moderate bacteroides thetaiotaomicron, and beta lactamase positive. ID following and has patient on Unasyn and Micafungin.  - Patient w/ persistent fevers over the weekend. Repeat CT 7/16 w/ perc drain in the pelvic fluid collection  that has nearly resolved now measuring 1.5 x 1.1 (previously 6.1 x 4.8 x 4.3 cm). There are still continued liver abscesses that are stable to slightly decreased. Suspect this is the cause of patients persistent fevers. His wbc and fever curve are improving - Cont IV abx and antifungal coverage per ID recs - I do not think that he needs emergency surgery for his diverticulitis at this time. He is now tolerating a soft diet, wbc has normalized, fever curve has improved and he is completely NT on exam. I think that he can be d/c'd from our standpoint with follow up arranged in the office. IR is arranging drain clinic appointment for 7-10d from d/c. Asked RN to teach how to flush IR drain. Discharge abx duration and selection per ID. Will reach out to [facility] to notify them recommendations and get their thoughts as well on timing of d/c. - I spoke with patients with on speaker phone and updated her on plan. We also discussed return/call back precautions.    FEN - Soft diet, IVF per [facility] VTE - SCDs, subq heparin ID - Currently on Unasyn & Micafungin. Tmax 99.9. WBC wnl. No tachycardia or hypotension.    Recent UTI tx with Fosfomycin  AAA OSA HTN   LOS: 9 days    Jillyn Ledger , [facility] 09/05/2021, 8:02 AM Please see Amion for pager number during day hours 7:00am-4:30pm

## 2021-09-06 ENCOUNTER — Encounter: Payer: Self-pay | Admitting: Internal Medicine

## 2021-09-06 DIAGNOSIS — K5780 Diverticulitis of intestine, part unspecified, with perforation and abscess without bleeding: Secondary | ICD-10-CM | POA: Diagnosis not present

## 2021-09-06 LAB — CULTURE, BLOOD (ROUTINE X 2)
Culture: NO GROWTH
Culture: NO GROWTH
Special Requests: ADEQUATE

## 2021-09-06 NOTE — Progress Notes (Signed)
Patient sent home with supplies for dressings and JP drain. (Flushes, gauze, tape, specimen cup to empty JP drainage in and measure output).

## 2021-09-06 NOTE — Progress Notes (Signed)
Discharge instructions given to pt. Pt verbalized understanding of all teaching. Wife taught how to empty drain and do care for drain at home. Patient and wife verbalized understanding of all teaching and had no further questions.

## 2021-09-06 NOTE — Progress Notes (Signed)
Nurse called to room, pt complaining of 10/10 pain related to IV. IV was saline locked at the time. No implications of IV infiltration, site was clean, dry, and intact; no redness or swelling. IV removed per pt request. Clarene Essex, NP notified pt refusing IV insertion due to pending discharge tomorrow 7/20, nurse unable to give scheduled IV Unasyn for the night. Pt with no other complaints at this time. Will continue with POC.

## 2021-09-06 NOTE — Progress Notes (Signed)
Mobility Specialist Progress Note:   09/06/21 0930  Mobility  Activity Stood at bedside (+ march in place)  Level of Assistance Independent  Assistive Device None  Activity Response Tolerated fair  $Mobility charge 1 Mobility   Pt in chair and reluctant to mobility d/t swelling in BLE. Pt agreeable to only standing and marching in place. Left in chair with all needs met, eager for d/c.   [name] [name] Mobility Specialist-Acute Rehab Secure Chat only

## 2021-09-06 NOTE — Progress Notes (Signed)
Patient refused all morning medications. Wife and patient stated that are ready to go home. Morning meds charted as not given on [facility]

## 2021-09-06 NOTE — Progress Notes (Signed)
Patient discharged to home with wife via wheelchair with all belongings

## 2021-09-12 ENCOUNTER — Other Ambulatory Visit: Payer: Self-pay | Admitting: Surgery

## 2021-09-12 DIAGNOSIS — K5780 Diverticulitis of intestine, part unspecified, with perforation and abscess without bleeding: Secondary | ICD-10-CM

## 2021-09-13 ENCOUNTER — Other Ambulatory Visit: Payer: BC Managed Care – PPO

## 2021-09-19 ENCOUNTER — Telehealth: Payer: Self-pay

## 2021-09-19 ENCOUNTER — Inpatient Hospital Stay: Payer: BC Managed Care – PPO | Admitting: Internal Medicine

## 2021-09-19 NOTE — Telephone Encounter (Signed)
Per Dr. Linus Salmons, called and rescheduled patient for follow up appointment after patient's drain appointments (which were rescheduled for 8/10). Patient states he has an appointment with his surgeon on 8/11.   Patient verbalized understanding he is now scheduled for follow up on 8/17 at 9am with Dr. Linus Salmons.  Binnie Kand, RN

## 2021-09-27 ENCOUNTER — Ambulatory Visit
Admission: RE | Admit: 2021-09-27 | Discharge: 2021-09-27 | Disposition: A | Discharge status: Home / Self Care | Payer: BC Managed Care – PPO | Source: Ambulatory Visit | Attending: Surgery | Admitting: Surgery

## 2021-09-27 ENCOUNTER — Encounter: Payer: Self-pay | Admitting: Radiology

## 2021-09-27 ENCOUNTER — Ambulatory Visit
Admission: RE | Admit: 2021-09-27 | Discharge: 2021-09-27 | Disposition: A | Discharge status: Home / Self Care | Payer: BC Managed Care – PPO | Source: Ambulatory Visit | Attending: Physician Assistant | Admitting: Physician Assistant

## 2021-09-27 DIAGNOSIS — K5780 Diverticulitis of intestine, part unspecified, with perforation and abscess without bleeding: Secondary | ICD-10-CM

## 2021-09-27 HISTORY — PX: IR RADIOLOGIST EVAL & MGMT: IMG5224

## 2021-09-27 MED ORDER — IOPAMIDOL (ISOVUE-300) INJECTION 61%
100.0000 mL | Freq: Once | INTRAVENOUS | Status: AC | PRN
  Administered 2021-09-27: 100 mL via INTRAVENOUS

## 2021-09-27 NOTE — Progress Notes (Addendum)
Referring Physician(s): Dr Ayesha Rumpf Dr Willene Hatchet  Chief Complaint: The patient is seen in follow up today s/p liver abscess and diverticular abscess drains placed in IR 08/28/21 Liver abscess drain removed 7/17  History of present illness:  Diverticular abscess and liver abscess Drains placed for both 7/11 in IR Liver abscess removed 7/17 Pelvic abscess drain intact  For CT  evaluation and follow up today  Denies pain fever or chills OP  cloudy yellow color ~25-30 cc daily Flushing 5 cc daily Connected to JP  To see Dr Zenia Resides tomorrow [facility] To see Dr Linus Salmons 8/17 Inf Disease Finished antibiotic yesterday  Past Medical History:  Diagnosis Date   Anxiety    Coronary artery disease    Prior inferior MI in 2005 with stent to RCA that subsequently occluded. Has left to right collaterals. Last cath in 2009 with stent to to LAD. 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   CORONARY STENT PLACEMENT     LAD WITH A 3.0X15MM PROMUS DRUG-ELUTING STENT PLACED.   INGUINAL HERNIA REPAIR N/A 09/02/2019   Procedure: HERNIA REPAIR UMBILICAL ,ADULT;  Surgeon: Raynelle Bring, MD;  Location: [facility];  Service: Urology;  Laterality: N/A;   KNEE ARTHROPLASTY Left 10/23/2017   Procedure: LEFT TOTAL KNEE ARTHROPLASTY WITH COMPUTER NAVIGATION;  Surgeon: Rod Can, MD;  Location: [facility];  Service: Orthopedics;  Laterality: Left;  Adductor Block   KNEE ARTHROSCOPY Left  1982   LYMPHADENECTOMY Bilateral 09/02/2019   Procedure: LYMPHADENECTOMY, PELVIC;  Surgeon: Raynelle Bring, MD;  [facility];  Service: Urology;  Laterality: Bilateral;   ROBOT ASSISTED LAPAROSCOPIC RADICAL PROSTATECTOMY N/A 09/02/2019   Procedure: XI ROBOTIC ASSISTED LAPAROSCOPIC RADICAL PROSTATECTOMY LEVEL 2;  Surgeon: Raynelle Bring, MD;  Location[facility];  Service: Urology;  Laterality: N/A;   TONSILLECTOMY     US ECHOCARDIOGRAPHY  03/14/2003   EF 55-60%    Allergies: Patient has no known allergies.  Medications: Prior to Admission medications   Medication Sig Start Date End Date Taking? Authorizing Provider  allopurinol (ZYLOPRIM) 100 MG tablet Take 100 mg by mouth daily.  05/05/19   [provider]  colchicine 0.6 MG tablet Take 1 tablet (0.6 mg total) by mouth daily for 2 days. 09/05/21 09/07/21  Mariel Aloe, MD  hydrochlorothiazide (HYDRODIURIL) 25 MG tablet TAKE 1 TABLET (25 MG TOTAL) BY MOUTH DAILY. Patient taking differently: Take 25 mg by mouth daily. 01/09/15   Burtis Junes, NP  lisinopril (PRINIVIL,ZESTRIL) 20 MG tablet TAKE 1 TABLET BY MOUTH EVERY DAY Patient taking differently: Take 20 mg by mouth daily. 07/04/14   Larey Dresser, MD  meloxicam (MOBIC) 15 MG tablet Take 15 mg by mouth daily as needed for pain. 07/20/21   [provider]  metoprolol succinate (TOPROL-XL) 25 MG 24  hr tablet Take 25 mg by mouth daily. 08/22/21   [provider]  tadalafil (CIALIS) 5 MG tablet Take 5 mg by mouth daily. 08/20/21   [provider]     Family History  Problem Relation Age of Onset   Hypertension Mother    Lung cancer Father        smoked three packs per day   Prostate cancer Neg Hx    Colon cancer Neg Hx    Pancreatic cancer Neg Hx    Breast cancer Neg Hx     Social History   Socioeconomic History   Marital status: Married    Spouse name: Renee   Number of children: 3   Years of education: Not on file   Highest education  level: Not on file  Occupational History   Not on file  Tobacco Use   Smoking status: Never   Smokeless tobacco: Never  Vaping Use   Vaping Use: Never used  Substance and Sexual Activity   Alcohol use: Yes    Comment: occ    2-3 drinka a week   Drug use: Not Currently    Types: Marijuana    Comment: last week was last use THC CBD balm   Sexual activity: Not Currently    Birth control/protection: None  Other Topics Concern   Not on file  Social History Narrative   3 sons   Social Determinants of Health   Financial Resource Strain: Not on file  Food Insecurity: Not on file  Transportation Needs: Not on file  Physical Activity: Not on file  Stress: Not on file  Social Connections: Not on file     Vital Signs: There were no vitals taken for this visit.  Physical Exam Skin:    General: Skin is warm.     Comments: Site is clean and dry NT no bleeding No sign of infection JP intact ~25 cc cloudy yellow fluid   Injection does reveal + fistula to bowel     Imaging: No results found.  Labs:  CBC: Recent Labs    09/02/21 0929 09/03/21 0121 09/04/21 0155 09/05/21 0148  WBC 13.2* 13.8* 11.4* 9.9  HGB 11.3* 11.0* 11.0* 10.8*  HCT 34.2* 33.5* 32.8* 32.2*  PLT 367 360 390 349    COAGS: Recent Labs    08/27/21 1824 08/28/21 0300  INR 1.2 1.2  APTT 28  --     BMP: Recent Labs    09/02/21 0929 09/03/21 0121 09/04/21 0155 09/05/21 0148  NA 135 133* 134* 135  K 4.1 4.1 4.1 4.0  CL 102 103 104 102  CO2 22 20* 22 23  GLUCOSE 128* 159* 122* 129*  BUN 9 7* 8 11  CALCIUM 8.2* 8.2* 8.2* 8.2*  CREATININE 1.03 1.09 1.09 1.25*  GFRNONAA >60 >60 >60 >60    LIVER FUNCTION TESTS: Recent Labs    08/29/21 0236 08/30/21 0101 08/31/21 0113 09/01/21 0106  BILITOT 1.4* 1.3* 0.9 0.9  AST 46* 45* 33 41  ALT 67* 54* 42 41  ALKPHOS 140* 137* 123 110  PROT 6.8 7.3 7.2 6.8  ALBUMIN 2.1* 2.1* 2.0* 2.1*    Assessment:  Liver abscess-- drain removed  7/17 Diverticular abscess- drain intact CT today revealing much improved liver findings per Dr Dwaine Gale Also almost resolved pelvic abscess +fistula to bowel per injection of drain Drain to remain: change to bag and stop flushes Keep appt with Dr Ayesha Rumpf and Dr Willene Hatchet  (Pt to discuss  antibiotic treatment with Inf Disease) Return to IR Clinic 2 weeks Pt and wife aware of plan and agreeable  Signed: Lavonia Drafts, PA-C 09/27/2021, 1:48 PM   Please refer to Dr. Dwaine Gale attestation of this note for management and plan.

## 2021-10-01 ENCOUNTER — Other Ambulatory Visit: Payer: Self-pay | Admitting: Surgery

## 2021-10-01 DIAGNOSIS — K651 Peritoneal abscess: Secondary | ICD-10-CM

## 2021-10-04 ENCOUNTER — Other Ambulatory Visit: Payer: Self-pay

## 2021-10-04 ENCOUNTER — Encounter: Payer: Self-pay | Admitting: Internal Medicine

## 2021-10-04 ENCOUNTER — Ambulatory Visit: Payer: BC Managed Care – PPO | Admitting: Internal Medicine

## 2021-10-04 VITALS — BP 135/79 | HR 73 | Temp 97.7°F | Ht 70.5 in | Wt 239.0 lb

## 2021-10-04 DIAGNOSIS — K750 Abscess of liver: Secondary | ICD-10-CM | POA: Diagnosis not present

## 2021-10-04 DIAGNOSIS — K5720 Diverticulitis of large intestine with perforation and abscess without bleeding: Secondary | ICD-10-CM | POA: Diagnosis not present

## 2021-10-04 DIAGNOSIS — K5780 Diverticulitis of intestine, part unspecified, with perforation and abscess without bleeding: Secondary | ICD-10-CM | POA: Diagnosis not present

## 2021-10-04 NOTE — Progress Notes (Signed)
   Subjective:    Patient ID: [name] [name], male    DOB: [birth year], 61 y.o.   MRN: [id number]  HPI Here for hospital follow up for liver and pelvic abscess He was admitted to the hospital in July with intra-abdominal abscess, fever from a diverticular abscess and liver.  He underwent drain placement for both and prior to discharge the liver abscess nearly resolved and the drain was removed.  His pelvic abscess drain remained and he was discharged on 3 weeks of amoxicillin/clavulanate + fluconazole.  On follow up his liver abscess looked nearly resolved but the pelvic drain has remained in place with the concern of a fistula.  He has now been off antibiotics about 10 days and is feeling well.  He is eating better, no fever, no chills.      Review of Systems  Constitutional:  Negative for chills and fever.  Gastrointestinal:  Negative for diarrhea and nausea.  Skin:  Negative for rash.       Objective:   Physical Exam Eyes:     General: No scleral icterus. Pulmonary:     Effort: Pulmonary effort is normal.  Neurological:     Mental Status: He is alert.           Assessment & Plan:

## 2021-10-04 NOTE — Assessment & Plan Note (Signed)
With pelvic abscess that is resolved.  Drain in place due to the ongoing fistula concern but clinically no signs of infection with no fever, no chills and feeling well overall.  No further antibiotics indicated.  IR and surgery notes reviewed.    I have personally spent 30 minutes involved in face-to-face and non-face-to-face activities for this patient on the day of the visit. Professional time spent includes the following activities: Preparing to see the patient (review of tests), Obtaining and/or reviewing separately obtained history (admission/discharge record), Performing a medically appropriate examination and/or evaluation , Ordering medications/tests/procedures, referring and communicating with other health care professionals, Documenting clinical information in the EMR, Independently interpreting results (not separately reported), Communicating results to the patient/family/caregiver, Counseling and educating the patient/family/caregiver and Care coordination (not separately reported).

## 2021-10-04 NOTE — Assessment & Plan Note (Signed)
Clinically he is doing well c/w a resolved abscess.  Now off antibiotics and has remained well.  No further treatment indicated at this time.

## 2021-10-10 ENCOUNTER — Ambulatory Visit
Admission: RE | Admit: 2021-10-10 | Discharge: 2021-10-10 | Disposition: A | Discharge status: Home / Self Care | Payer: BC Managed Care – PPO | Source: Ambulatory Visit | Attending: Surgery | Admitting: Surgery

## 2021-10-10 DIAGNOSIS — K651 Peritoneal abscess: Secondary | ICD-10-CM

## 2021-10-10 HISTORY — PX: IR RADIOLOGIST EVAL & MGMT: IMG5224

## 2021-10-10 NOTE — Progress Notes (Addendum)
Referring Physician(s): Dr Ayesha Rumpf Dr Willene Hatchet  Chief Complaint: The patient is seen in follow up today s/p liver abscess and diverticular abscess drains placed in IR 08/28/21 Liver abscess drain removed 7/17  History of present illness:  Diverticular abscess and liver abscesses Drains placed for both 7/11 in IR Liver abscess removed 7/17 Pelvic abscess drain intact  For drain injection evaluation and follow up today  Denies pain fever or chills OP  cloudy yellow color 10-15cc daily No flushing Connected to gravity bag  No longer on antibiotics Has upcoming surgical follow up.  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 Has left to right collaterals. Last cath in 2009 with stent to to LAD. 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)    Sleep apnea    Stress     Past Surgical History:  Procedure Laterality Date   ANTERIOR CRUCIATE LIGAMENT REPAIR Right 1990   CARDIAC CATHETERIZATION  10/09/2007   EF 45-50%   CARDIOVASCULAR STRESS TEST  11/23/2008   EF 55%   CORONARY STENT PLACEMENT     RIGHT CORONARY THAT SUBSEQUENTLY OCCLUDED.   CORONARY STENT PLACEMENT     LAD WITH A 3.0X15MM PROMUS DRUG-ELUTING STENT PLACED.   INGUINAL HERNIA REPAIR N/A 09/02/2019   Procedure: HERNIA REPAIR UMBILICAL ,ADULT;  Surgeon: Raynelle Bring, MD;  [facility];  Service: Urology;  Laterality: N/A;   IR RADIOLOGIST EVAL & MGMT  09/27/2021   IR RADIOLOGIST EVAL & MGMT  10/10/2021   KNEE ARTHROPLASTY Left 10/23/2017   Procedure: LEFT TOTAL KNEE ARTHROPLASTY WITH COMPUTER NAVIGATION;  Surgeon: Rod Can, MD;  [facility];  Service: Orthopedics;   Laterality: Left;  Adductor Block   KNEE ARTHROSCOPY Left 1982   LYMPHADENECTOMY Bilateral 09/02/2019   Procedure: LYMPHADENECTOMY, PELVIC;  Surgeon: Raynelle Bring, MD;  [facility];  Service: Urology;  Laterality: Bilateral;   ROBOT ASSISTED LAPAROSCOPIC RADICAL PROSTATECTOMY N/A 09/02/2019   Procedure: XI ROBOTIC ASSISTED LAPAROSCOPIC RADICAL PROSTATECTOMY LEVEL 2;  Surgeon: Raynelle Bring, MD;  [facility];  Service: Urology;  Laterality: N/A;   TONSILLECTOMY     US ECHOCARDIOGRAPHY  03/14/2003   EF 55-60%    Allergies: Patient has no known allergies.  Medications: Prior to Admission medications   Medication Sig Start Date End Date Taking? Authorizing Provider  allopurinol (ZYLOPRIM) 100 MG tablet Take 100 mg by mouth daily.  05/05/19   [provider]  colchicine 0.6 MG tablet Take 1 tablet (0.6 mg total) by mouth daily for 2 days. 09/05/21 09/07/21  Mariel Aloe, MD  hydrochlorothiazide (HYDRODIURIL) 25 MG tablet TAKE 1 TABLET (25 MG TOTAL) BY MOUTH DAILY. Patient taking differently: Take 25 mg by mouth daily. 01/09/15   Burtis Junes, NP  lisinopril (PRINIVIL,ZESTRIL) 20 MG tablet TAKE 1 TABLET BY MOUTH EVERY DAY Patient taking differently: Take 20 mg by mouth daily. 07/04/14   Larey Dresser, MD  meloxicam (MOBIC) 15 MG tablet Take 15 mg by mouth daily as needed for pain. 07/20/21   [provider]  metoprolol succinate (TOPROL-XL) 25 MG 24 hr tablet Take 25 mg by mouth daily. 08/22/21   [provider]  tadalafil (CIALIS) 5 MG tablet Take 5 mg by mouth daily. 08/20/21   [provider]     Family History  Problem Relation Age of Onset   Hypertension Mother    Lung cancer Father        smoked three packs per day   Prostate cancer Neg Hx    Colon cancer Neg Hx    Pancreatic cancer Neg Hx    Breast cancer Neg Hx     Social History   Socioeconomic History   Marital status: Married    Spouse name: Renee   Number of children: 3    Years of education: Not on file   Highest education level: Not on file  Occupational History   Not on file  Tobacco Use   Smoking status: Never   Smokeless tobacco: Never  Vaping Use   Vaping Use: Never used  Substance and Sexual Activity   Alcohol use: Yes    Comment: occ    2-3 drinka a week   Drug use: Not Currently    Types: Marijuana    Comment: last week was last use THC CBD balm   Sexual activity: Not Currently    Birth control/protection: None  Other Topics Concern   Not on file  Social History Narrative   3 sons   Social Determinants of Health   Financial Resource Strain: Not on file  Food Insecurity: Not on file  Transportation Needs: Not on file  Physical Activity: Not on file  Stress: Not on file  Social Connections: Not on file     Vital Signs: There were no vitals taken for this visit.  Physical Exam Skin:    General: Skin is warm.     Comments: Site is clean and dry NT no bleeding No sign of infection Gravity bag intact with approximately 5-10cc cloudy yellow fluid   Injection does reveal + fistula to bowel     Imaging: IR Radiologist Eval & Mgmt  Result Date: 10/10/2021 Please refer to notes tab for details about interventional procedure. (Op Note)   Labs:  CBC: Recent Labs    09/02/21 0929 09/03/21 0121 09/04/21 0155 09/05/21 0148  WBC 13.2* 13.8* 11.4* 9.9  HGB 11.3* 11.0* 11.0* 10.8*  HCT 34.2* 33.5* 32.8* 32.2*  PLT 367 360 390 349    COAGS: Recent Labs    08/27/21 1824 08/28/21 0300  INR 1.2 1.2  APTT 28  --     BMP: Recent Labs    09/02/21 0929 09/03/21 0121 09/04/21 0155 09/05/21 0148  NA 135 133* 134* 135  K 4.1 4.1 4.1 4.0  CL 102 103 104 102  CO2 22 20* 22 23  GLUCOSE 128* 159* 122* 129*  BUN 9 7* 8 11  CALCIUM 8.2* 8.2* 8.2* 8.2*  CREATININE 1.03 1.09 1.09 1.25*  GFRNONAA >60 >60 >60 >60    LIVER FUNCTION TESTS: Recent Labs    08/29/21 0236 08/30/21 0101 08/31/21 0113 09/01/21 0106   BILITOT 1.4* 1.3* 0.9 0.9  AST 46* 45* 33 41  ALT 67* 54* 42 41  ALKPHOS 140* 137* 123 110  PROT 6.8 7.3 7.2 6.8  ALBUMIN 2.1* 2.1* 2.0* 2.1*    Assessment:  Liver abscess-- drain removed 7/17 Diverticular abscess- drain intact +persistent fistula to bowel per injection of drain Drain to remain Keep appt with Dr  Ayesha Rumpf   Return to [employer] [facility] [employer] 2 weeks Pt and wife aware of plan and agreeable  Signed: Pasty Spillers, PA 10/10/2021, 2:52 PM   Please refer to Dr. Dwaine Gale attestation of this note for management and plan.

## 2021-10-11 ENCOUNTER — Other Ambulatory Visit: Payer: Self-pay | Admitting: Surgery

## 2021-10-11 DIAGNOSIS — K651 Peritoneal abscess: Secondary | ICD-10-CM

## 2021-10-24 ENCOUNTER — Ambulatory Visit
Admission: RE | Admit: 2021-10-24 | Discharge: 2021-10-24 | Disposition: A | Discharge status: Home / Self Care | Payer: BC Managed Care – PPO | Source: Ambulatory Visit | Attending: Surgery | Admitting: Surgery

## 2021-10-24 ENCOUNTER — Encounter: Payer: Self-pay | Admitting: Radiology

## 2021-10-24 DIAGNOSIS — K651 Peritoneal abscess: Secondary | ICD-10-CM

## 2021-10-24 HISTORY — PX: IR RADIOLOGIST EVAL & MGMT: IMG5224

## 2021-10-24 NOTE — Progress Notes (Signed)
Reason for visit: Drain follow up. Colonic fistula   Care Team(s): Primary Care: Bernerd Limbo, MD Surgeon: Dwan Bolt MD Infectious Disease: Thayer Headings, MD   History of present illness:  61 y/o M comorbid w PMHx significant for CAD, obesity, prostate CA and diverticulitis w multifocal liver and pericolonic abscesses. Pt known to VIR service s/p drain placement x2 on 08/28/21. Pt has completed 3 wk course of Abx per ID. Perihepatic drain was removed on 09/03/21. Pelvic drain with fistula.   He was most recently seen in drain clinic on 10/10/21, converted to drainage bag and directions to avoid flushes. He reports scant drainage. Denies F/C.  Review of Systems: A 12-point ROS discussed, and pertinent positives are indicated in the HPI above.  All other systems are negative.   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 Has left to right collaterals. Last cath in 2009 with stent to to LAD. 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   CORONARY STENT PLACEMENT     LAD WITH A 3.0X15MM PROMUS DRUG-ELUTING STENT PLACED.   INGUINAL HERNIA REPAIR N/A 09/02/2019   Procedure: HERNIA REPAIR UMBILICAL ,ADULT;  Surgeon: Raynelle Bring, MD;  [facility];  Service: Urology;  Laterality: N/A;   IR RADIOLOGIST EVAL & MGMT  09/27/2021   IR RADIOLOGIST  EVAL & MGMT  10/10/2021   IR RADIOLOGIST EVAL & MGMT  10/24/2021   KNEE ARTHROPLASTY Left 10/23/2017   Procedure: LEFT TOTAL KNEE ARTHROPLASTY WITH COMPUTER NAVIGATION;  Surgeon: Rod Can, MD;  [facility];  Service: Orthopedics;  Laterality: Left;  Adductor Block   KNEE ARTHROSCOPY Left 1982   LYMPHADENECTOMY Bilateral 09/02/2019   Procedure: LYMPHADENECTOMY, PELVIC;  Surgeon: Raynelle Bring, MD;  [facility];  Service: Urology;  Laterality: Bilateral;   ROBOT ASSISTED LAPAROSCOPIC RADICAL PROSTATECTOMY N/A 09/02/2019   Procedure: XI ROBOTIC ASSISTED LAPAROSCOPIC RADICAL PROSTATECTOMY LEVEL 2;  Surgeon: Raynelle Bring, MD;  [facility];  Service: Urology;  Laterality: N/A;   TONSILLECTOMY     US ECHOCARDIOGRAPHY  03/14/2003   EF 55-60%    Allergies: Patient has no known allergies.  Medications: Prior to Admission medications   Medication Sig Start Date End Date Taking? Authorizing Provider  allopurinol (ZYLOPRIM) 100 MG tablet Take 100 mg by mouth daily.  05/05/19   [provider]  colchicine 0.6 MG tablet Take 1 tablet (0.6 mg total) by mouth daily for 2 days. 09/05/21 09/07/21  Mariel Aloe, MD  hydrochlorothiazide (HYDRODIURIL) 25 MG tablet TAKE 1 TABLET (25 MG TOTAL) BY MOUTH DAILY. Patient taking differently: Take 25 mg by mouth daily. 01/09/15   Burtis Junes, NP  lisinopril (PRINIVIL,ZESTRIL)  20 MG tablet TAKE 1 TABLET BY MOUTH EVERY DAY Patient taking differently: Take 20 mg by mouth daily. 07/04/14   Larey Dresser, MD  meloxicam (MOBIC) 15 MG tablet Take 15 mg by mouth daily as needed for pain. 07/20/21   [provider]  metoprolol succinate (TOPROL-XL) 25 MG 24 hr tablet Take 25 mg by mouth daily. 08/22/21   [provider]  omeprazole (PRILOSEC) 40 MG capsule Take 40 mg by mouth daily. 09/25/21   [provider]  tadalafil (CIALIS) 5 MG tablet Take 5 mg by mouth daily. 08/20/21   [provider]     Family History   Problem Relation Age of Onset   Hypertension Mother    Lung cancer Father        smoked three packs per day   Prostate cancer Neg Hx    Colon cancer Neg Hx    Pancreatic cancer Neg Hx    Breast cancer Neg Hx     Social History   Socioeconomic History   Marital status: Married    Spouse name: Renee   Number of children: 3   Years of education: Not on file   Highest education level: Not on file  Occupational History   Not on file  Tobacco Use   Smoking status: Never   Smokeless tobacco: Never  Vaping Use   Vaping Use: Never used  Substance and Sexual Activity   Alcohol use: Yes    Comment: occ    2-3 drinka a week   Drug use: Not Currently    Types: Marijuana    Comment: last week was last use THC CBD balm   Sexual activity: Not Currently    Birth control/protection: None  Other Topics Concern   Not on file  Social History Narrative   3 sons   Social Determinants of Health   Financial Resource Strain: Not on file  Food Insecurity: Not on file  Transportation Needs: Not on file  Physical Activity: Not on file  Stress: Not on file  Social Connections: Not on file     Vital Signs: BP 131/78   Pulse 74   Temp 97.8 F (36.6 C)   SpO2 97%   Physical Exam  General: WN, NAD  CV: RRR on monitor Pulm: normal work of breathing on RA Abd: Rotund. S, ND, NT *Midline pelvic drain c/d/i   Imaging:  Drain injection, independently reviewed demonstrating; patent fistulous communication with diseased portion of sigmoid colon.    DG Sinus/Fist Tube Chk-Non GI  Result Date: 10/24/2021 CLINICAL DATA:  Follow-up drain evaluation. Briefly, 61 year old male with complicated diverticulitis and hepatic and pericolonic abscesses s/p drain placement. Pericolonic drain with colonic fistula on recent evaluation. EXAM: FLUOROSCOPIC DRAIN EVALUATION / ABSCESSOGRAM / FISTULOGRAM COMPARISON:  IR fluoroscopy, 10/10/2021.  CT AP, 09/27/2021. CONTRAST:  10 mL Omnipaque  300-administered via the existing percutaneous drain. FLUOROSCOPY TIME:  Fluoroscopic dose; 4.2 mGy TECHNIQUE: The patient was positioned supine on the fluoroscopy table. A preprocedural spot fluoroscopic image was obtained of the pelvis and the existing percutaneous drainage catheter at the midline pelvis. Multiple spot fluoroscopic and radiographic images were obtained following the injection of a small amount of contrast via the existing percutaneous drainage catheter. FINDINGS: *Stable positioning of midline pelvic drain with no significant residual abscess cavity. *Persistent, patent fistulous communication with diseased portion of sigmoid colon. See key image. *No fluoroscopic evidence of fistulous urinary bladder communication. IMPRESSION: 1. 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 2. No evidence of fistulous urinary bladder communication Michaelle Birks, MD [facility] Electronically Signed   By: Michaelle Birks M.D.   On: 10/24/2021 12:08   IR Radiologist Eval & Mgmt  Result Date: 10/24/2021 Please refer to notes tab for details about interventional procedure. (Op Note)   Labs:  CBC: Recent Labs    09/02/21 0929 09/03/21 0121 09/04/21 0155 09/05/21 0148  WBC 13.2* 13.8* 11.4* 9.9  HGB 11.3* 11.0* 11.0* 10.8*  HCT 34.2* 33.5* 32.8* 32.2*  PLT 367 360 390 349    COAGS: Recent Labs    08/27/21 1824 08/28/21 0300  INR 1.2 1.2  APTT 28  --     BMP: Recent Labs    09/02/21 0929 09/03/21 0121 09/04/21 0155 09/05/21 0148  NA 135 133* 134* 135  K 4.1 4.1 4.1 4.0  CL 102 103 104 102  CO2 22 20* 22 23  GLUCOSE 128* 159* 122* 129*  BUN 9 7* 8 11  CALCIUM 8.2* 8.2* 8.2* 8.2*  CREATININE 1.03 1.09 1.09 1.25*  GFRNONAA >60 >60 >60 >60    LIVER FUNCTION TESTS: Recent Labs    08/29/21 0236 08/30/21 0101 08/31/21 0113 09/01/21 0106  BILITOT 1.4* 1.3* 0.9 0.9  AST 46* 45* 33 41  ALT 67* 54*  42 41  ALKPHOS 140* 137* 123 110  PROT 6.8 7.3 7.2 6.8  ALBUMIN 2.1* 2.1* 2.0* 2.1*    Assessment and Plan:  61 y/o M comorbid w PMHx significant for CAD, obesity, prostate CA and diverticulitis w multifocal liver and pericolonic abscesses. Pt known to VIR service s/p drain placement x2 on 08/28/21. Pt has completed 3 wk course of Abx per ID. Perihepatic drain was removed on 09/03/21. Pelvic drain with fistula.  Drain evaluation today (10/24/21), demonstrating; - Persistent, patent fistulous communication with diseased portion of sigmoid colon.  - No fluoroscopic evidence of fistulous urinary bladder communication.   *continue drain to gravity bag. Avoid flushes. *follow up with General / Colorectal Surgery for definitive management of diseased sigmoid. *Drain to remain until surgical intervention.   Electronically Signed:  Michaelle Birks, MD [facility] Rad[facility]   Pager. [phone] Clinic. [phone]  I spent a total of 25 Minutes in face to face in clinical consultation, greater than 50% of which was counseling/coordinating care for abscess drain with colonic fistula.

## 2021-11-06 ENCOUNTER — Ambulatory Visit: Payer: Self-pay | Admitting: General Surgery

## 2021-11-06 NOTE — H&P (Signed)
PROVIDER:  Monico Blitz, MD  MRN: [id number] DOB: [birth year] DATE OF ENCOUNTER: 11/06/2021  Subjective   Chief Complaint: New Problem (Diverticulitis )     History of Present Illness: [name] Haq. is a 61 y.o. male who is seen today as an office consultation at the request of Dr. Marcello Moores for evaluation of New Problem (Diverticulitis ) .  Patient presented to the hospital in early July with fevers and was found to have diverticulitis with hepatic abscesses.  He underwent pelvic drain placement and percutaneous drainage of one of the larger liver abscesses.  He was then placed on oral antibiotics per ID and his liver abscess is resolved.  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  Colonoscopy was repeated in September 2023.  This showed diverticulosis of the sigmoid colon with no stricture or mass noted.  The exam was otherwise normal.  Patient has a history of a robotic prostatectomy and hernia repair approximately 2 years ago by Dr. Alinda Money.   Review of Systems: A complete review of systems was obtained from the patient.  I have reviewed this information and discussed as appropriate with the patient.  See HPI as well for other ROS.    Medical History: Past Medical History:  Diagnosis Date   Arthritis    History of cancer    Hyperlipidemia    Hypertension    Sleep apnea     Patient Active Problem List  Diagnosis   Diverticulitis of large intestine with perforation and abscess without bleeding   Liver abscess    Past Surgical History:  Procedure Laterality Date   COLONOSCOPY  10/29/2021   heart stents     prostate removal     REPLACEMENT TOTAL KNEE       No Known Allergies  Current Outpatient Medications on File Prior to Visit  Medication Sig Dispense Refill   allopurinoL (ZYLOPRIM) 300 MG tablet Take 1 tablet by mouth once daily     aspirin 81 MG EC tablet Take by mouth     chlorhexidine (PERIDEX) 0.12 % solution SWISH WITH A  CAPFUL TWICE DAILY AFTER BRUSHING AND FLOSSING     hydroCHLOROthiazide (HYDRODIURIL) 25 MG tablet Take 1 tablet by mouth every morning     lisinopriL (ZESTRIL) 20 MG tablet TAKE ONE TABLET (20 MG DOSE) BY MOUTH DAILY.     metoprolol succinate (TOPROL-XL) 25 MG XL tablet Take 1 tablet by mouth once daily     tadalafiL (CIALIS) 5 MG tablet Take 5 mg by mouth once daily     No current facility-administered medications on file prior to visit.    Family History  Problem Relation Age of Onset   High blood pressure (Hypertension) Mother    Hyperlipidemia (Elevated cholesterol) Mother    Lung cancer Father      Social History   Tobacco Use  Smoking Status Never  Smokeless Tobacco Never     Social History   Socioeconomic History   Marital status: Married  Tobacco Use   Smoking status: Never   Smokeless tobacco: Never  Substance and Sexual Activity   Alcohol use: Yes   Drug use: Never    Objective:    There were no vitals filed for this visit.   Exam Gen: NAD Abd: soft, IR drain with purulence noted in bag    Labs, Imaging and Diagnostic Testing: CT reviewed  Assessment and Plan:  Diagnoses and all orders for this visit:  Diverticular disease  of left colon  Other orders -     polyethylene glycol (MIRALAX) powder; Take 233.75 g by mouth once for 1 dose Take according to your procedure prep instructions. -     bisacodyL (DULCOLAX) 5 mg EC tablet; Take 4 tablets (20 mg total) by mouth once daily as needed for Constipation for up to 1 dose -     metroNIDAZOLE (FLAGYL) 500 MG tablet; Take 2 tablets (1,000 mg total) by mouth 3 (three) times daily for 3 doses Take according to your procedure colon prep instructions     Patient with a recent history of diverticulitis which resulted in pelvic abscess as well as multiple hepatic abscesses.  He is now recovered from this but his drain remains due to a persistent fistula.  He has completed a colonoscopy which showed no signs of  malignancy.  I have recommended proceeding with robotic assisted partial colectomy.  Patient does have a ventral hernia.  We discussed primary hernia repair as well.  The surgery and anatomy were described to the patient as well as the risks of surgery and the possible complications.  These include: Bleeding, deep abdominal infections and possible wound complications such as hernia and infection, damage to adjacent structures, leak of surgical connections, which can lead to other surgeries and possibly an ostomy, possible need for other procedures, such as abscess drains in radiology, possible prolonged hospital stay, possible diarrhea from removal of part of the colon, possible constipation from narcotics, possible bowel, bladder or sexual dysfunction if having rectal surgery, prolonged fatigue/weakness or appetite loss, possible early recurrence of of disease, possible complications of their medical problems such as heart disease or arrhythmias or lung problems, death (less than 1%). I believe the patient understands and wishes to proceed with the surgery.   No follow-ups on file.    Rosario Adie, MD Colon and Rectal Surgery [facility]

## 2021-12-12 ENCOUNTER — Other Ambulatory Visit (HOSPITAL_COMMUNITY): Payer: BC Managed Care – PPO

## 2021-12-20 NOTE — Patient Instructions (Addendum)
DUE TO COVID-19 ONLY TWO VISITORS  (aged 63 and older)  ARE ALLOWED TO COME WITH YOU AND STAY IN THE WAITING ROOM ONLY DURING PRE OP AND PROCEDURE.   **NO VISITORS ARE ALLOWED IN THE SHORT STAY AREA OR RECOVERY ROOM!!**  IF YOU WILL BE ADMITTED INTO THE HOSPITAL YOU ARE ALLOWED ONLY FOUR SUPPORT PEOPLE DURING VISITATION HOURS ONLY (7 AM -8PM)   The support person(s) must pass our screening, gel in and out, and wear a mask at all times, including in the patient's room. Patients must also wear a mask when staff or their support person are in the room. Visitors GUEST BADGE MUST BE WORN VISIBLY  One adult visitor may remain with you overnight and MUST be in the room by 8 P.M.     Your procedure is scheduled on: 12/27/21   Report to [facility] Main Entrance    Report to admitting at  7:45 AM   Call this number if you have problems the morning of surgery [phone]   Do not eat food :After Midnight.   After Midnight you may have the following liquids until _7:00_____ AM/ DAY OF SURGERY  Water Black Coffee (sugar ok, NO MILK/CREAM OR CREAMERS)  Tea (sugar ok, NO MILK/CREAM OR CREAMERS) regular and decaf                             Plain Jell-O (NO RED)                                           Fruit ices (not with fruit pulp, NO RED)                                     Popsicles (NO RED)                                                                  Juice: apple, WHITE grape, WHITE cranberry Sports drinks like Gatorade (NO RED)              Drink 2 Ensure/G2 drinks AT 10:00 PM the night before surgery.        The day of surgery:  Drink ONE (1) Pre-Surgery Clear Ensure at 6:45 AM the morning of surgery. Drink in one sitting. Do not sip.  This drink was given to you during your hospital  pre-op appointment visit. Nothing else to drink after completing the  Pre-Surgery Clear Ensure at 7:00 AM          If you have questions, please contact your surgeon's  office.   FOLLOW BOWEL PREP AND ANY ADDITIONAL PRE OP INSTRUCTIONS YOU RECEIVED FROM YOUR SURGEON'S OFFICE!!!             Drink plenty of fluids on DOS to prevent dehydration   Oral Hygiene is also important to reduce your risk of infection.  Remember - BRUSH YOUR TEETH THE MORNING OF SURGERY WITH YOUR REGULAR TOOTHPASTE   Do NOT smoke after Midnight   Take these medicines the morning of surgery with A SIP OF WATER: Metoprolol                                                                                                                           Allopurinol   Before surgery.Stop taking __ASA 81 mg _________on _11/2_________as instructed by _____________.  Stop taking ____________as directed by your Surgeon/Cardiologist.  Contact your Surgeon/Cardiologist for instructions on Anticoagulant Therapy prior to surgery.   Bring CPAP mask and tubing day of surgery.                              You may not have any metal on your body including  jewelry, and body piercing             Do not wear , lotions, powders, cologne, or deodorant                Men may shave face and neck.   Do not bring valuables to the hospital. [place].   Contacts, dentures or bridgework may not be worn into surgery.   Bring small overnight bag day of surgery.   DO NOT [place]. PHARMACY WILL DISPENSE MEDICATIONS LISTED ON YOUR MEDICATION LIST TO YOU DURING YOUR ADMISSION [place]!     Special Instructions: Bring a copy of your healthcare power of attorney and living will documents  the day of surgery if you haven't scanned them before.              Please read over the following fact sheets you were given: IF YOU HAVE QUESTIONS ABOUT YOUR PRE-OP INSTRUCTIONS PLEASE CALL [phone]    [facility] - Preparing for Surgery Before surgery, you can play an important role.  Because  skin is not sterile, your skin needs to be as free of germs as possible.  You can reduce the number of germs on your skin by washing with CHG (chlorahexidine gluconate) soap before surgery.  CHG is an antiseptic cleaner which kills germs and bonds with the skin to continue killing germs even after washing. Please DO NOT use if you have an allergy to CHG or antibacterial soaps.  If your skin becomes reddened/irritated stop using the CHG and inform your nurse when you arrive at Short Stay.  You may shave your face/neck. Please follow these instructions carefully:  1.  Shower with CHG Soap the night before surgery and the  morning of Surgery.  2.  If you choose to wash your hair, wash your hair first as usual with your  normal  shampoo.  3.  After you shampoo, rinse your hair and  body thoroughly to remove the  shampoo.                            4.  Use CHG as you would any other liquid soap.  You can apply chg directly  to the skin and wash                       Gently with a scrungie or clean washcloth.  5.  Apply the CHG Soap to your body ONLY FROM THE NECK DOWN.   Do not use on face/ open                           Wound or open sores. Avoid contact with eyes, ears mouth and genitals (private parts).                       Wash face,  Genitals (private parts) with your normal soap.             6.  Wash thoroughly, paying special attention to the area where your surgery  will be performed.  7.  Thoroughly rinse your body with warm water from the neck down.  8.  DO NOT shower/wash with your normal soap after using and rinsing off  the CHG Soap.                9.  Pat yourself dry with a clean towel.            10.  Wear clean pajamas.            11.  Place clean sheets on your bed the night of your first shower and do not  sleep with pets. Day of Surgery : Do not apply any lotions/deodorants the morning of surgery.  Please wear clean clothes to the hospital/surgery center.  FAILURE TO FOLLOW THESE  INSTRUCTIONS MAY RESULT IN THE CANCELLATION OF YOUR SURGERY  ________________________________________________________________________  Incentive Spirometer  An incentive spirometer is a tool that can help keep your lungs clear and active. This tool measures how well you are filling your lungs with each breath. Taking long deep breaths may help reverse or decrease the chance of developing breathing (pulmonary) problems (especially infection) following: A long period of time when you are unable to move or be active. BEFORE THE PROCEDURE  If the spirometer includes an indicator to show your best effort, your nurse or respiratory therapist will set it to a desired goal. If possible, sit up straight or lean slightly forward. Try not to slouch. Hold the incentive spirometer in an upright position. INSTRUCTIONS FOR USE  Sit on the edge of your bed if possible, or sit up as far as you can in bed or on a chair. Hold the incentive spirometer in an upright position. Breathe out normally. Place the mouthpiece in your mouth and seal your lips tightly around it. Breathe in slowly and as deeply as possible, raising the piston or the ball toward the top of the column. Hold your breath for 3-5 seconds or for as long as possible. Allow the piston or ball to fall to the bottom of the column. Remove the mouthpiece from your mouth and breathe out normally. Rest for a few seconds and repeat Steps 1 through 7 at least 10 times every 1-2 hours when you are awake. Take your time and take  a few normal breaths between deep breaths. The spirometer may include an indicator to show your best effort. Use the indicator as a goal to work toward during each repetition. After each set of 10 deep breaths, practice coughing to be sure your lungs are clear. If you have an incision (the cut made at the time of surgery), support your incision when coughing by placing a pillow or rolled up towels firmly against it. Once you are able  to get out of bed, walk around indoors and cough well. You may stop using the incentive spirometer when instructed by your caregiver.  RISKS AND COMPLICATIONS Take your time so you do not get dizzy or light-headed. If you are in pain, you may need to take or ask for pain medication before doing incentive spirometry. It is harder to take a deep breath if you are having pain. AFTER USE Rest and breathe slowly and easily. It can be helpful to keep track of a log of your progress. Your caregiver can provide you with a simple table to help with this. If you are using the spirometer at home, follow these instructions: [place] IF:  You are having difficultly using the spirometer. You have trouble using the spirometer as often as instructed. Your pain medication is not giving enough relief while using the spirometer. You develop fever of 100.5 F (38.1 C) or higher. SEEK IMMEDIATE MEDICAL CARE IF:  You cough up bloody sputum that had not been present before. You develop fever of 102 F (38.9 C) or greater. You develop worsening pain at or near the incision site. MAKE SURE YOU:  Understand these instructions. Will watch your condition. Will get help right away if you are not doing well or get worse. Document Released: 06/17/2006 Document Revised: 04/29/2011 Document Reviewed: 08/18/2006 [facility] Patient Information 2014 [place], [place].   ________________________________________________________________________

## 2021-12-21 ENCOUNTER — Encounter (HOSPITAL_COMMUNITY): Payer: Self-pay

## 2021-12-21 ENCOUNTER — Encounter (HOSPITAL_COMMUNITY)
Admission: RE | Admit: 2021-12-21 | Discharge: 2021-12-21 | Disposition: A | Discharge status: Home / Self Care | Payer: BC Managed Care – PPO | Source: Ambulatory Visit | Attending: General Surgery | Admitting: General Surgery

## 2021-12-21 ENCOUNTER — Other Ambulatory Visit: Payer: Self-pay

## 2021-12-21 DIAGNOSIS — Z01812 Encounter for preprocedural laboratory examination: Secondary | ICD-10-CM | POA: Insufficient documentation

## 2021-12-21 DIAGNOSIS — Z01818 Encounter for other preprocedural examination: Secondary | ICD-10-CM

## 2021-12-21 DIAGNOSIS — K432 Incisional hernia without obstruction or gangrene: Secondary | ICD-10-CM | POA: Insufficient documentation

## 2021-12-21 DIAGNOSIS — I10 Essential (primary) hypertension: Secondary | ICD-10-CM | POA: Insufficient documentation

## 2021-12-21 DIAGNOSIS — I2510 Atherosclerotic heart disease of native coronary artery without angina pectoris: Secondary | ICD-10-CM | POA: Diagnosis not present

## 2021-12-21 DIAGNOSIS — I252 Old myocardial infarction: Secondary | ICD-10-CM | POA: Diagnosis not present

## 2021-12-21 DIAGNOSIS — I4510 Unspecified right bundle-branch block: Secondary | ICD-10-CM | POA: Diagnosis not present

## 2021-12-21 LAB — BASIC METABOLIC PANEL
Anion gap: 8 (ref 5–15)
BUN: 17 mg/dL (ref 8–23)
CO2: 26 mmol/L (ref 22–32)
Calcium: 9.3 mg/dL (ref 8.9–10.3)
Chloride: 101 mmol/L (ref 98–111)
Creatinine, Ser: 1.25 mg/dL — ABNORMAL HIGH (ref 0.61–1.24)
GFR, Estimated: >60 mL/min (ref >60)
Glucose, Bld: 111 mg/dL — ABNORMAL HIGH (ref 70–99)
Potassium: 4.4 mmol/L (ref 3.5–5.1)
Sodium: 135 mmol/L (ref 135–145)

## 2021-12-21 LAB — CBC
HCT: 50.5 % (ref 39.0–52.0)
Hemoglobin: 17.3 g/dL — ABNORMAL HIGH (ref 13.0–17.0)
MCH: 31 pg (ref 26.0–34.0)
MCHC: 34.3 g/dL (ref 30.0–36.0)
MCV: 90.5 fL (ref 80.0–100.0)
Platelets: 177 10*3/uL (ref 150–400)
RBC: 5.58 MIL/uL (ref 4.22–5.81)
RDW: 12.8 % (ref 11.5–15.5)
WBC: 6 10*3/uL (ref 4.0–10.5)
nRBC: 0 % (ref 0.0–0.2)

## 2021-12-21 NOTE — Progress Notes (Signed)
Anesthesia note:  Bowel prep reminder:  yes  PCP - Azalia Bilis Cardiologist -Dr. Thurmond Butts Other-   Chest x-ray - no EKG - 09/05/21-epic Stress Test - no ECHO - 2015 Cardiac Cath - 2005 with stent and 2009 with stent CABG-no Pacemaker/ICD device last checked:no  Sleep Study - yes CPAP - yes  Pt is pre diabetic-no CBG at PAT visit- Fasting Blood Sugar at home- Checks Blood Sugar _____  Blood Thinner:ASA '81mg'$  Blood Thinner Instructions: Aspirin Instructions:hold 7 days prior Last Dose:12/20/21  Anesthesia review: Yes  reason: cardiac  Patient denies shortness of breath, fever, cough and chest pain at PAT appointment. Pt reports no SOB with activities He has a pelvic drain that is clean,dry and intact with no drainage.  Patient verbalized understanding of instructions that were given to them at the PAT appointment. Patient was also instructed that they will need to review over the PAT instructions again at home before surgery.yes

## 2021-12-24 NOTE — Progress Notes (Signed)
Anesthesia Chart Review   Case: [id number] Date/Time: 12/27/21 0945   Procedures:      XI ROBOT ASSISTED LAPAROSCOPIC PARTIAL COLECTOMY     PRIMARY HERNIA REPAIR   Anesthesia type: General   Pre-op diagnosis: INCISIONAL HERNIA, DIVERTICULAR DISEASE   Location: [place] 02 / [facility]   Surgeons: Leighton Ruff, MD       [name] [name]:61 y.o. never smoker with h/o HTN, CAD (stents 2005, 2009), RBBB, sleep apnea, prostate cancer, incisional hernia, diverticular disease scheduled for above procedure 70/04/5007 with Dr. Leighton Ruff.   Pt last seen by cardiology 07/04/2021. Per OV note, "CAD, history of MI, s/p PCI with stents to RCA and PDA in 2005 and another PCI 2009- patient denies chest pain or shortness of breath.  Patient can walk 4-5 miles without problems.  Recent stress test from September 2022 was negative for ischemia with excellent functional capacity 11.1 METS and low normal LVEF 50 to 55% as above. The previous stress test was 4 years ago and July 2019 and revealed inferior scar and no ischemia with LVEF 44%.  Previous cardiac echo with LVEF 50 to 55% from 2019. Blood pressure is mildly elevated today but per patient BP remains under control at home. "  Anticipate pt can proceed with planned procedure barring acute status change.   VS: BP (!) 157/97   Pulse 66   Temp 36.8 C (Oral)   Resp 18   Ht 5' 10.5" (1.791 m)   Wt 110.2 kg   SpO2 96%   BMI 34.37 kg/m   PROVIDERS: [name] Limbo, MD is PCP   [name] Goodell, MD is Cardiologist  LABS: Labs reviewed: Acceptable for surgery. (all labs ordered are listed, but only abnormal results are displayed)  Labs Reviewed  BASIC METABOLIC PANEL - Abnormal; Notable for the following components:      Result Value   Glucose, Bld 111 (*)    Creatinine, Ser 1.25 (*)    All other components within normal limits  CBC - Abnormal; Notable for the following components:   Hemoglobin 17.3 (*)    All other components within normal limits   TYPE AND SCREEN     IMAGES:   EKG:   CV: Stress Echo 11/07/2020 Left Ventricle: Systolic function is low normal. EF: 53-55%.    Left Ventricle: Wall motion is normal.    Tricuspid Valve: Unable to assess RVSP without IVC visualization but  considering TR jet velocity only 2 m/s, RVSP is likely within normal  limit.   Left Atrium: Left atrium is mildly dilated.    Mitral Valve: Mitral valve structure is normal. The leaflets are mildly  thickened and exhibit normal excursion.    Post-stress: The left ventricle systolic function is borderline  hyperdynamic post-stress with an EF of 67-70 %.    Post-stress: The post-stress echo showed normal wall motion which was  borderline hyperdynamic compared to baseline.    Stress ECG: ECG Conclusion:  No ischemic ST segment changes occurred  with stress.  Sensitivity is slightly reduced due to RBBB on baseline ECG.  No chest pain. Excellent functional capacity with estimated workload 11.1  METS which is above average functional capacity for age and gender.  Treadmill Duke score +8.5.    Post-stress Impression: The study is normal.    Post-stress Impression: This study shows a low prognostic risk.    Post-stress Impression: The ECG and Echo portions of the stress study  are concordant with no evidence of inducible myocardial ischemia.  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 Has left to right collaterals. Last cath in 2009 with stent to to LAD. 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   CORONARY STENT PLACEMENT     LAD WITH A 3.0X15MM PROMUS DRUG-ELUTING STENT PLACED.   INGUINAL HERNIA REPAIR N/A 09/02/2019   Procedure: HERNIA REPAIR UMBILICAL ,ADULT;  Surgeon: [name] Bring, MD;  Location: [facility];  Service: Urology;  Laterality: N/A;   IR RADIOLOGIST EVAL & MGMT  09/27/2021   IR RADIOLOGIST EVAL & MGMT  10/10/2021   IR RADIOLOGIST EVAL & MGMT  10/24/2021   KNEE ARTHROPLASTY Left 10/23/2017   Procedure: LEFT TOTAL KNEE ARTHROPLASTY WITH COMPUTER NAVIGATION;  Surgeon: Rod Can, MD;  Location: [facility];  Service: Orthopedics;  Laterality: Left;  Adductor Block   KNEE ARTHROSCOPY Left 1982   LYMPHADENECTOMY Bilateral 09/02/2019   Procedure: LYMPHADENECTOMY, PELVIC;  Surgeon: [name] Bring, MD;  Location: [facility];  Service: Urology;  Laterality: Bilateral;   ROBOT ASSISTED LAPAROSCOPIC RADICAL PROSTATECTOMY N/A 09/02/2019   Procedure: XI ROBOTIC ASSISTED LAPAROSCOPIC RADICAL PROSTATECTOMY LEVEL 2;  Surgeon: [name] Bring, MD;  Location: [facility];  Service: Urology;  Laterality: N/A;   TONSILLECTOMY     US ECHOCARDIOGRAPHY  03/14/2003   EF 55-60%    MEDICATIONS:  acidophilus (RISAQUAD) CAPS capsule   allopurinol (ZYLOPRIM) 300 MG tablet   aspirin EC 81 MG tablet   colchicine 0.6 MG tablet   hydrochlorothiazide (HYDRODIURIL) 25 MG tablet   lisinopril (PRINIVIL,ZESTRIL) 20 MG tablet   meloxicam (MOBIC) 15 MG tablet   metoprolol succinate (TOPROL-XL) 25 MG 24 hr tablet   NON FORMULARY   tadalafil (CIALIS) 5 MG tablet   No current facility-administered medications for this encounter.    [name] Felix Ward, PA-C [facility] Pre-Surgical Testing [phone]

## 2021-12-27 ENCOUNTER — Inpatient Hospital Stay (HOSPITAL_COMMUNITY): Payer: BC Managed Care – PPO | Admitting: Registered Nurse

## 2021-12-27 ENCOUNTER — Encounter (HOSPITAL_COMMUNITY): Payer: Self-pay | Admitting: General Surgery

## 2021-12-27 ENCOUNTER — Inpatient Hospital Stay (HOSPITAL_COMMUNITY): Payer: BC Managed Care – PPO | Admitting: Physician Assistant

## 2021-12-27 ENCOUNTER — Other Ambulatory Visit: Payer: Self-pay

## 2021-12-27 ENCOUNTER — Inpatient Hospital Stay (HOSPITAL_COMMUNITY)
Admission: RE | Admit: 2021-12-27 | Discharge: 2021-12-29 | DRG: 330 | Disposition: A | Discharge status: Home / Self Care | Payer: BC Managed Care – PPO | Attending: Surgery | Admitting: Surgery

## 2021-12-27 ENCOUNTER — Encounter (HOSPITAL_COMMUNITY)
Admission: RE | Disposition: A | Discharge status: Home / Self Care | Payer: Self-pay | Source: Home / Self Care | Attending: General Surgery

## 2021-12-27 DIAGNOSIS — Z79899 Other long term (current) drug therapy: Secondary | ICD-10-CM

## 2021-12-27 DIAGNOSIS — I10 Essential (primary) hypertension: Secondary | ICD-10-CM | POA: Diagnosis present

## 2021-12-27 DIAGNOSIS — Z96659 Presence of unspecified artificial knee joint: Secondary | ICD-10-CM | POA: Diagnosis present

## 2021-12-27 DIAGNOSIS — K5790 Diverticulosis of intestine, part unspecified, without perforation or abscess without bleeding: Secondary | ICD-10-CM | POA: Diagnosis present

## 2021-12-27 DIAGNOSIS — G4730 Sleep apnea, unspecified: Secondary | ICD-10-CM | POA: Diagnosis present

## 2021-12-27 DIAGNOSIS — K432 Incisional hernia without obstruction or gangrene: Secondary | ICD-10-CM | POA: Diagnosis present

## 2021-12-27 DIAGNOSIS — E669 Obesity, unspecified: Secondary | ICD-10-CM | POA: Diagnosis present

## 2021-12-27 DIAGNOSIS — K5720 Diverticulitis of large intestine with perforation and abscess without bleeding: Principal | ICD-10-CM | POA: Diagnosis present

## 2021-12-27 DIAGNOSIS — M1990 Unspecified osteoarthritis, unspecified site: Secondary | ICD-10-CM | POA: Diagnosis present

## 2021-12-27 DIAGNOSIS — Z01818 Encounter for other preprocedural examination: Secondary | ICD-10-CM

## 2021-12-27 DIAGNOSIS — Z7982 Long term (current) use of aspirin: Secondary | ICD-10-CM

## 2021-12-27 DIAGNOSIS — K632 Fistula of intestine: Secondary | ICD-10-CM | POA: Diagnosis present

## 2021-12-27 DIAGNOSIS — Z6836 Body mass index (BMI) 36.0-36.9, adult: Secondary | ICD-10-CM

## 2021-12-27 DIAGNOSIS — Z8249 Family history of ischemic heart disease and other diseases of the circulatory system: Secondary | ICD-10-CM | POA: Diagnosis not present

## 2021-12-27 DIAGNOSIS — E785 Hyperlipidemia, unspecified: Secondary | ICD-10-CM | POA: Diagnosis present

## 2021-12-27 DIAGNOSIS — Z955 Presence of coronary angioplasty implant and graft: Secondary | ICD-10-CM

## 2021-12-27 HISTORY — PX: INCISIONAL HERNIA REPAIR: SHX193

## 2021-12-27 LAB — TYPE AND SCREEN
ABO/RH(D): O POS
Antibody Screen: NEGATIVE

## 2021-12-27 SURGERY — COLECTOMY, PARTIAL, ROBOT-ASSISTED, LAPAROSCOPIC
Anesthesia: General

## 2021-12-27 MED ORDER — PROPOFOL 10 MG/ML IV BOLUS
INTRAVENOUS | Status: AC
  Filled 2021-12-27: qty 20

## 2021-12-27 MED ORDER — GABAPENTIN 300 MG PO CAPS
300.0000 mg | ORAL_CAPSULE | Freq: Two times a day (BID) | ORAL | Status: DC
  Administered 2021-12-27 – 2021-12-29 (×4): 300 mg via ORAL
  Filled 2021-12-27 (×4): qty 1

## 2021-12-27 MED ORDER — BUPIVACAINE LIPOSOME 1.3 % IJ SUSP
INTRAMUSCULAR | Status: AC
  Filled 2021-12-27: qty 20

## 2021-12-27 MED ORDER — AMISULPRIDE (ANTIEMETIC) 5 MG/2ML IV SOLN: 10.0000 mg | Freq: Once | INTRAVENOUS | Status: DC | PRN

## 2021-12-27 MED ORDER — SODIUM CHLORIDE 0.9 % IV SOLN
2.0000 g | Freq: Two times a day (BID) | INTRAVENOUS | Status: AC
  Administered 2021-12-27: 2 g via INTRAVENOUS
  Filled 2021-12-27: qty 2

## 2021-12-27 MED ORDER — 0.9 % SODIUM CHLORIDE (POUR BTL) OPTIME
TOPICAL | Status: DC | PRN
  Administered 2021-12-27: 1000 mL

## 2021-12-27 MED ORDER — CHLORHEXIDINE GLUCONATE 0.12 % MT SOLN
15.0000 mL | Freq: Once | OROMUCOSAL | Status: AC
  Administered 2021-12-27: 15 mL via OROMUCOSAL

## 2021-12-27 MED ORDER — ENSURE PRE-SURGERY PO LIQD
296.0000 mL | Freq: Once | ORAL | Status: DC
  Filled 2021-12-27: qty 296

## 2021-12-27 MED ORDER — BUPIVACAINE-EPINEPHRINE 0.25% -1:200000 IJ SOLN
INTRAMUSCULAR | Status: DC | PRN
  Administered 2021-12-27: 30 mL

## 2021-12-27 MED ORDER — ENSURE PRE-SURGERY PO LIQD
592.0000 mL | Freq: Once | ORAL | Status: DC
  Filled 2021-12-27: qty 592

## 2021-12-27 MED ORDER — FENTANYL CITRATE (PF) 100 MCG/2ML IJ SOLN
INTRAMUSCULAR | Status: AC
  Filled 2021-12-27: qty 2

## 2021-12-27 MED ORDER — LIDOCAINE HCL 2 % IJ SOLN
INTRAMUSCULAR | Status: AC
  Filled 2021-12-27: qty 20

## 2021-12-27 MED ORDER — SODIUM CHLORIDE 0.9 % IV SOLN
2.0000 g | INTRAVENOUS | Status: AC
  Administered 2021-12-27: 2 g via INTRAVENOUS
  Filled 2021-12-27: qty 2

## 2021-12-27 MED ORDER — ENOXAPARIN SODIUM 40 MG/0.4ML IJ SOSY
40.0000 mg | PREFILLED_SYRINGE | INTRAMUSCULAR | Status: DC
  Administered 2021-12-28 – 2021-12-29 (×2): 40 mg via SUBCUTANEOUS
  Filled 2021-12-27 (×2): qty 0.4

## 2021-12-27 MED ORDER — ALVIMOPAN 12 MG PO CAPS
12.0000 mg | ORAL_CAPSULE | ORAL | Status: AC
  Administered 2021-12-27: 12 mg via ORAL
  Filled 2021-12-27: qty 1

## 2021-12-27 MED ORDER — SACCHAROMYCES BOULARDII 250 MG PO CAPS
250.0000 mg | ORAL_CAPSULE | Freq: Two times a day (BID) | ORAL | Status: DC
  Administered 2021-12-27 – 2021-12-29 (×4): 250 mg via ORAL
  Filled 2021-12-27 (×4): qty 1

## 2021-12-27 MED ORDER — DEXAMETHASONE SODIUM PHOSPHATE 10 MG/ML IJ SOLN
INTRAMUSCULAR | Status: DC | PRN
  Administered 2021-12-27: 8 mg via INTRAVENOUS

## 2021-12-27 MED ORDER — ONDANSETRON HCL 4 MG/2ML IJ SOLN: 4.0000 mg | Freq: Once | INTRAMUSCULAR | Status: DC | PRN

## 2021-12-27 MED ORDER — HYDROCHLOROTHIAZIDE 25 MG PO TABS
25.0000 mg | ORAL_TABLET | Freq: Every day | ORAL | Status: DC
  Administered 2021-12-28 – 2021-12-29 (×2): 25 mg via ORAL
  Filled 2021-12-27 (×2): qty 1

## 2021-12-27 MED ORDER — ROCURONIUM BROMIDE 10 MG/ML (PF) SYRINGE
PREFILLED_SYRINGE | INTRAVENOUS | Status: AC
  Filled 2021-12-27: qty 10

## 2021-12-27 MED ORDER — DIPHENHYDRAMINE HCL 12.5 MG/5ML PO ELIX: 12.5000 mg | ORAL_SOLUTION | Freq: Four times a day (QID) | ORAL | Status: DC | PRN

## 2021-12-27 MED ORDER — ACETAMINOPHEN 500 MG PO TABS
1000.0000 mg | ORAL_TABLET | Freq: Four times a day (QID) | ORAL | Status: DC
  Administered 2021-12-27 – 2021-12-29 (×5): 1000 mg via ORAL
  Filled 2021-12-27 (×5): qty 2

## 2021-12-27 MED ORDER — HYDROMORPHONE HCL 1 MG/ML IJ SOLN
INTRAMUSCULAR | Status: AC
  Administered 2021-12-27: 0.25 mg via INTRAVENOUS
  Filled 2021-12-27: qty 1

## 2021-12-27 MED ORDER — BUPIVACAINE LIPOSOME 1.3 % IJ SUSP
INTRAMUSCULAR | Status: DC | PRN
  Administered 2021-12-27: 20 mL

## 2021-12-27 MED ORDER — GABAPENTIN 300 MG PO CAPS
300.0000 mg | ORAL_CAPSULE | ORAL | Status: AC
  Administered 2021-12-27: 300 mg via ORAL
  Filled 2021-12-27: qty 1

## 2021-12-27 MED ORDER — FENTANYL CITRATE (PF) 100 MCG/2ML IJ SOLN
INTRAMUSCULAR | Status: DC | PRN
  Administered 2021-12-27 (×2): 25 ug via INTRAVENOUS
  Administered 2021-12-27: 50 ug via INTRAVENOUS
  Administered 2021-12-27: 100 ug via INTRAVENOUS

## 2021-12-27 MED ORDER — POLYETHYLENE GLYCOL 3350 17 GM/SCOOP PO POWD: 1.0000 | Freq: Once | ORAL | Status: DC

## 2021-12-27 MED ORDER — BUPIVACAINE-EPINEPHRINE (PF) 0.25% -1:200000 IJ SOLN
INTRAMUSCULAR | Status: AC
  Filled 2021-12-27: qty 30

## 2021-12-27 MED ORDER — SIMETHICONE 80 MG PO CHEW: 40.0000 mg | CHEWABLE_TABLET | Freq: Four times a day (QID) | ORAL | Status: DC | PRN

## 2021-12-27 MED ORDER — SUGAMMADEX SODIUM 200 MG/2ML IV SOLN
INTRAVENOUS | Status: DC | PRN
  Administered 2021-12-27: 200 mg via INTRAVENOUS

## 2021-12-27 MED ORDER — OXYCODONE HCL 5 MG/5ML PO SOLN: 5.0000 mg | Freq: Once | ORAL | Status: AC | PRN

## 2021-12-27 MED ORDER — ONDANSETRON HCL 4 MG/2ML IJ SOLN: 4.0000 mg | Freq: Four times a day (QID) | INTRAMUSCULAR | Status: DC | PRN

## 2021-12-27 MED ORDER — LIDOCAINE HCL (PF) 2 % IJ SOLN
INTRAMUSCULAR | Status: AC
  Filled 2021-12-27: qty 5

## 2021-12-27 MED ORDER — DIPHENHYDRAMINE HCL 50 MG/ML IJ SOLN: 12.5000 mg | Freq: Four times a day (QID) | INTRAMUSCULAR | Status: DC | PRN

## 2021-12-27 MED ORDER — BISACODYL 5 MG PO TBEC: 20.0000 mg | DELAYED_RELEASE_TABLET | Freq: Once | ORAL | Status: DC

## 2021-12-27 MED ORDER — EPHEDRINE 5 MG/ML INJ
INTRAVENOUS | Status: AC
  Filled 2021-12-27: qty 5

## 2021-12-27 MED ORDER — ORAL CARE MOUTH RINSE: 15.0000 mL | Freq: Once | OROMUCOSAL | Status: AC

## 2021-12-27 MED ORDER — LISINOPRIL 20 MG PO TABS
20.0000 mg | ORAL_TABLET | Freq: Every day | ORAL | Status: DC
  Administered 2021-12-28 – 2021-12-29 (×2): 20 mg via ORAL
  Filled 2021-12-27 (×2): qty 1

## 2021-12-27 MED ORDER — EPHEDRINE SULFATE-NACL 50-0.9 MG/10ML-% IV SOSY
PREFILLED_SYRINGE | INTRAVENOUS | Status: DC | PRN
  Administered 2021-12-27: 5 mg via INTRAVENOUS

## 2021-12-27 MED ORDER — ONDANSETRON HCL 4 MG/2ML IJ SOLN
INTRAMUSCULAR | Status: AC
  Filled 2021-12-27: qty 2

## 2021-12-27 MED ORDER — LACTATED RINGERS IV SOLN: INTRAVENOUS | Status: DC | PRN

## 2021-12-27 MED ORDER — ALVIMOPAN 12 MG PO CAPS
12.0000 mg | ORAL_CAPSULE | Freq: Two times a day (BID) | ORAL | Status: DC
  Administered 2021-12-28: 12 mg via ORAL
  Filled 2021-12-27: qty 1

## 2021-12-27 MED ORDER — ONDANSETRON HCL 4 MG/2ML IJ SOLN
INTRAMUSCULAR | Status: DC | PRN
  Administered 2021-12-27: 4 mg via INTRAVENOUS

## 2021-12-27 MED ORDER — ENSURE SURGERY PO LIQD
237.0000 mL | Freq: Two times a day (BID) | ORAL | Status: DC
  Administered 2021-12-28 – 2021-12-29 (×3): 237 mL via ORAL

## 2021-12-27 MED ORDER — LACTATED RINGERS IV SOLN: INTRAVENOUS | Status: DC

## 2021-12-27 MED ORDER — ACETAMINOPHEN 500 MG PO TABS
1000.0000 mg | ORAL_TABLET | ORAL | Status: AC
  Administered 2021-12-27: 1000 mg via ORAL
  Filled 2021-12-27: qty 2

## 2021-12-27 MED ORDER — ROCURONIUM BROMIDE 10 MG/ML (PF) SYRINGE
PREFILLED_SYRINGE | INTRAVENOUS | Status: DC | PRN
  Administered 2021-12-27 (×2): 20 mg via INTRAVENOUS
  Administered 2021-12-27: 80 mg via INTRAVENOUS

## 2021-12-27 MED ORDER — MIDAZOLAM HCL 2 MG/2ML IJ SOLN
INTRAMUSCULAR | Status: AC
  Filled 2021-12-27: qty 2

## 2021-12-27 MED ORDER — HYDROMORPHONE HCL 1 MG/ML IJ SOLN
0.2500 mg | INTRAMUSCULAR | Status: DC | PRN
  Administered 2021-12-27 (×2): 0.5 mg via INTRAVENOUS
  Administered 2021-12-27 (×2): 0.25 mg via INTRAVENOUS

## 2021-12-27 MED ORDER — DEXAMETHASONE SODIUM PHOSPHATE 10 MG/ML IJ SOLN
INTRAMUSCULAR | Status: AC
  Filled 2021-12-27: qty 1

## 2021-12-27 MED ORDER — LACTATED RINGERS IR SOLN
Status: DC | PRN
  Administered 2021-12-27: 1000 mL

## 2021-12-27 MED ORDER — HYDROMORPHONE HCL 1 MG/ML IJ SOLN
0.5000 mg | INTRAMUSCULAR | Status: DC | PRN
  Administered 2021-12-27 – 2021-12-28 (×3): 0.5 mg via INTRAVENOUS
  Filled 2021-12-27 (×3): qty 0.5

## 2021-12-27 MED ORDER — LIDOCAINE 2% (20 MG/ML) 5 ML SYRINGE
INTRAMUSCULAR | Status: DC | PRN
  Administered 2021-12-27: 1.5 mg/kg/h via INTRAVENOUS
  Administered 2021-12-27: 80 mg via INTRAVENOUS

## 2021-12-27 MED ORDER — MIDAZOLAM HCL 5 MG/5ML IJ SOLN
INTRAMUSCULAR | Status: DC | PRN
  Administered 2021-12-27: 2 mg via INTRAVENOUS

## 2021-12-27 MED ORDER — ONDANSETRON HCL 4 MG PO TABS: 4.0000 mg | ORAL_TABLET | Freq: Four times a day (QID) | ORAL | Status: DC | PRN

## 2021-12-27 MED ORDER — PROPOFOL 10 MG/ML IV BOLUS
INTRAVENOUS | Status: DC | PRN
  Administered 2021-12-27: 180 mg via INTRAVENOUS

## 2021-12-27 MED ORDER — OXYCODONE HCL 5 MG PO TABS
ORAL_TABLET | ORAL | Status: AC
  Filled 2021-12-27: qty 1

## 2021-12-27 MED ORDER — OXYCODONE HCL 5 MG PO TABS
5.0000 mg | ORAL_TABLET | Freq: Once | ORAL | Status: AC | PRN
  Administered 2021-12-27: 5 mg via ORAL

## 2021-12-27 MED ORDER — METOPROLOL SUCCINATE ER 25 MG PO TB24
25.0000 mg | ORAL_TABLET | Freq: Every day | ORAL | Status: DC
  Administered 2021-12-28 – 2021-12-29 (×2): 25 mg via ORAL
  Filled 2021-12-27 (×2): qty 1

## 2021-12-27 MED ORDER — ALUM & MAG HYDROXIDE-SIMETH 200-200-20 MG/5ML PO SUSP: 30.0000 mL | Freq: Four times a day (QID) | ORAL | Status: DC | PRN

## 2021-12-27 MED ORDER — BUPIVACAINE LIPOSOME 1.3 % IJ SUSP: 20.0000 mL | Freq: Once | INTRAMUSCULAR | Status: DC

## 2021-12-27 MED ORDER — KCL IN DEXTROSE-NACL 20-5-0.45 MEQ/L-%-% IV SOLN
INTRAVENOUS | Status: DC
  Filled 2021-12-27: qty 1000

## 2021-12-27 MED ORDER — ALLOPURINOL 300 MG PO TABS
300.0000 mg | ORAL_TABLET | Freq: Every day | ORAL | Status: DC
  Administered 2021-12-28 – 2021-12-29 (×2): 300 mg via ORAL
  Filled 2021-12-27 (×2): qty 1

## 2021-12-27 SURGICAL SUPPLY — 104 items
BAG COUNTER SPONGE SURGICOUNT (BAG) ×1 IMPLANT
BLADE EXTENDED COATED 6.5IN (ELECTRODE) IMPLANT
CANNULA REDUC XI 12-8 STAPL (CANNULA)
CANNULA REDUCER 12-8 DVNC XI (CANNULA) IMPLANT
CELLS DAT CNTRL 66122 CELL SVR (MISCELLANEOUS) IMPLANT
CHLORAPREP W/TINT 26 (MISCELLANEOUS) ×1 IMPLANT
COVER SURGICAL LIGHT HANDLE (MISCELLANEOUS) ×2 IMPLANT
COVER TIP SHEARS 8 DVNC (MISCELLANEOUS) ×1 IMPLANT
COVER TIP SHEARS 8MM DA VINCI (MISCELLANEOUS) ×1
DRAIN CHANNEL 19F RND (DRAIN) IMPLANT
DRAPE ARM DVNC X/XI (DISPOSABLE) ×4 IMPLANT
DRAPE COLUMN DVNC XI (DISPOSABLE) ×1 IMPLANT
DRAPE DA VINCI XI ARM (DISPOSABLE) ×4
DRAPE DA VINCI XI COLUMN (DISPOSABLE) ×1
DRAPE LAPAROSCOPIC ABDOMINAL (DRAPES) ×1 IMPLANT
DRAPE SURG IRRIG POUCH 19X23 (DRAPES) ×1 IMPLANT
DRAPE UTILITY XL STRL (DRAPES) ×1 IMPLANT
DRSG OPSITE POSTOP 4X10 (GAUZE/BANDAGES/DRESSINGS) IMPLANT
DRSG OPSITE POSTOP 4X6 (GAUZE/BANDAGES/DRESSINGS) IMPLANT
DRSG OPSITE POSTOP 4X8 (GAUZE/BANDAGES/DRESSINGS) IMPLANT
ELECT PENCIL ROCKER SW 15FT (MISCELLANEOUS) ×1 IMPLANT
ELECT REM PT RETURN 15FT ADLT (MISCELLANEOUS) ×1 IMPLANT
ENDOLOOP SUT PDS II  0 18 (SUTURE)
ENDOLOOP SUT PDS II 0 18 (SUTURE) IMPLANT
EVACUATOR SILICONE 100CC (DRAIN) IMPLANT
GLOVE BIO SURGEON STRL SZ 6.5 (GLOVE) ×3 IMPLANT
GLOVE BIOGEL PI IND STRL 7.0 (GLOVE) ×2 IMPLANT
GLOVE INDICATOR 6.5 STRL GRN (GLOVE) ×1 IMPLANT
GOWN SRG XL LVL 4 BRTHBL STRL (GOWNS) ×1 IMPLANT
GOWN STRL NON-REIN XL LVL4 (GOWNS) ×1
GOWN STRL REUS W/ TWL XL LVL3 (GOWN DISPOSABLE) ×3 IMPLANT
GOWN STRL REUS W/TWL XL LVL3 (GOWN DISPOSABLE) ×3
GRASPER SUT TROCAR 14GX15 (MISCELLANEOUS) IMPLANT
HOLDER FOLEY CATH W/STRAP (MISCELLANEOUS) ×1 IMPLANT
IRRIG SUCT STRYKERFLOW 2 WTIP (MISCELLANEOUS) ×1
IRRIGATION SUCT STRKRFLW 2 WTP (MISCELLANEOUS) ×1 IMPLANT
KIT BASIN OR (CUSTOM PROCEDURE TRAY) ×1 IMPLANT
KIT PROCEDURE DA VINCI SI (MISCELLANEOUS)
KIT PROCEDURE DVNC SI (MISCELLANEOUS) IMPLANT
KIT SIGMOIDOSCOPE (SET/KITS/TRAYS/PACK) IMPLANT
KIT TURNOVER KIT A (KITS) IMPLANT
NDL INSUFFLATION 14GA 120MM (NEEDLE) ×1 IMPLANT
NEEDLE HYPO 22GX1.5 SAFETY (NEEDLE) ×1 IMPLANT
NEEDLE INSUFFLATION 14GA 120MM (NEEDLE) ×1 IMPLANT
PACK CARDIOVASCULAR III (CUSTOM PROCEDURE TRAY) ×1 IMPLANT
PACK COLON (CUSTOM PROCEDURE TRAY) ×1 IMPLANT
PACK GENERAL/GYN (CUSTOM PROCEDURE TRAY) ×1 IMPLANT
PAD POSITIONING PINK XL (MISCELLANEOUS) ×1 IMPLANT
RELOAD STAPLE 60 3.5 BLU DVNC (STAPLE) IMPLANT
RELOAD STAPLE 60 4.3 GRN DVNC (STAPLE) IMPLANT
RELOAD STAPLER 3.5X60 BLU DVNC (STAPLE) IMPLANT
RELOAD STAPLER 4.3X60 GRN DVNC (STAPLE) ×1 IMPLANT
RETRACTOR WND ALEXIS 18 MED (MISCELLANEOUS) IMPLANT
RTRCTR WOUND ALEXIS 18CM MED (MISCELLANEOUS)
SCISSORS LAP 5X35 DISP (ENDOMECHANICALS) IMPLANT
SEAL CANN UNIV 5-8 DVNC XI (MISCELLANEOUS) ×3 IMPLANT
SEAL XI 5MM-8MM UNIVERSAL (MISCELLANEOUS) ×3
SEALER VESSEL DA VINCI XI (MISCELLANEOUS) ×1
SEALER VESSEL EXT DVNC XI (MISCELLANEOUS) ×1 IMPLANT
SOLUTION ELECTROLUBE (MISCELLANEOUS) ×1 IMPLANT
SPIKE FLUID TRANSFER (MISCELLANEOUS) IMPLANT
STAPLER 60 DA VINCI SURE FORM (STAPLE) ×1
STAPLER 60 SUREFORM DVNC (STAPLE) IMPLANT
STAPLER CANNULA SEAL DVNC XI (STAPLE) IMPLANT
STAPLER CANNULA SEAL XI (STAPLE)
STAPLER ECHELON POWER CIR 29 (STAPLE) IMPLANT
STAPLER ECHELON POWER CIR 31 (STAPLE) IMPLANT
STAPLER RELOAD 3.5X60 BLU DVNC (STAPLE)
STAPLER RELOAD 3.5X60 BLUE (STAPLE)
STAPLER RELOAD 4.3X60 GREEN (STAPLE) ×1
STAPLER RELOAD 4.3X60 GRN DVNC (STAPLE) ×1
STAPLER VISISTAT 35W (STAPLE) IMPLANT
STOPCOCK 4 WAY LG BORE MALE ST (IV SETS) ×2 IMPLANT
SUT ETHILON 2 0 PS N (SUTURE) IMPLANT
SUT NOVA NAB DX-16 0-1 5-0 T12 (SUTURE) IMPLANT
SUT NOVA NAB GS-21 1 T12 (SUTURE) ×2 IMPLANT
SUT PROLENE 2 0 KS (SUTURE) IMPLANT
SUT SILK 2 0 (SUTURE) ×2
SUT SILK 2 0 SH CR/8 (SUTURE) IMPLANT
SUT SILK 2-0 18XBRD TIE 12 (SUTURE) ×1 IMPLANT
SUT SILK 3 0 (SUTURE) ×1
SUT SILK 3 0 SH CR/8 (SUTURE) ×1 IMPLANT
SUT SILK 3-0 18XBRD TIE 12 (SUTURE) IMPLANT
SUT V-LOC BARB 180 2/0GR6 GS22 (SUTURE)
SUT VIC AB 2-0 SH 18 (SUTURE) ×1 IMPLANT
SUT VIC AB 2-0 SH 27 (SUTURE) ×2
SUT VIC AB 2-0 SH 27X BRD (SUTURE) IMPLANT
SUT VIC AB 3-0 SH 18 (SUTURE) IMPLANT
SUT VIC AB 4-0 PS2 18 (SUTURE) ×1 IMPLANT
SUT VIC AB 4-0 PS2 27 (SUTURE) ×2 IMPLANT
SUT VICRYL 0 UR6 27IN ABS (SUTURE) ×1 IMPLANT
SUTURE V-LC BRB 180 2/0GR6GS22 (SUTURE) IMPLANT
SYR 10ML ECCENTRIC (SYRINGE) ×1 IMPLANT
SYR 20ML LL LF (SYRINGE) ×1 IMPLANT
SYS LAPSCP GELPORT 120MM (MISCELLANEOUS)
SYS WOUND ALEXIS 18CM MED (MISCELLANEOUS)
SYSTEM LAPSCP GELPORT 120MM (MISCELLANEOUS) IMPLANT
SYSTEM WOUND ALEXIS 18CM MED (MISCELLANEOUS) IMPLANT
TOWEL OR 17X26 10 PK STRL BLUE (TOWEL DISPOSABLE) ×1 IMPLANT
TOWEL OR NON WOVEN STRL DISP B (DISPOSABLE) ×1 IMPLANT
TRAY FOLEY MTR SLVR 16FR STAT (SET/KITS/TRAYS/PACK) ×1 IMPLANT
TROCAR ADV FIXATION 5X100MM (TROCAR) ×1 IMPLANT
TUBING CONNECTING 10 (TUBING) ×2 IMPLANT
TUBING INSUFFLATION 10FT LAP (TUBING) ×1 IMPLANT

## 2021-12-27 NOTE — H&P (Signed)
PROVIDER:  Monico Blitz, MD   MRN: [id number] DOB: [birth year] DATE OF ENCOUNTER: 11/06/2021   Subjective    Chief Complaint: New Problem (Diverticulitis )       History of Present Illness: [name] [name]. is a 61 y.o. male who is seen today as an office consultation at the request of Dr. Marcello Moores for evaluation of New Problem (Diverticulitis ) .  Patient presented to the hospital in early July with fevers and was found to have diverticulitis with hepatic abscesses.  He underwent pelvic drain placement and percutaneous drainage of one of the larger liver abscesses.  He was then placed on oral antibiotics per ID and his liver abscess is resolved.  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  Colonoscopy was repeated in September 2023.  This showed diverticulosis of the sigmoid colon with no stricture or mass noted.  The exam was otherwise normal.  Patient has a history of a robotic prostatectomy and hernia repair approximately 2 years ago by Dr. Alinda Money.     Review of Systems: A complete review of systems was obtained from the patient.  I have reviewed this information and discussed as appropriate with the patient.  See HPI as well for other ROS.       Medical History:     Past Medical History:  Diagnosis Date   Arthritis     History of cancer     Hyperlipidemia     Hypertension     Sleep apnea           Patient Active Problem List  Diagnosis   Diverticulitis of large intestine with perforation and abscess without bleeding   Liver abscess           Past Surgical History:  Procedure Laterality Date   COLONOSCOPY   10/29/2021   heart stents       prostate removal       REPLACEMENT TOTAL KNEE          No Known Allergies         Current Outpatient Medications on File Prior to Visit  Medication Sig Dispense Refill   allopurinoL (ZYLOPRIM) 300 MG tablet Take 1 tablet by mouth once daily       aspirin 81 MG EC tablet Take by mouth        chlorhexidine (PERIDEX) 0.12 % solution SWISH WITH A CAPFUL TWICE DAILY AFTER BRUSHING AND FLOSSING       hydroCHLOROthiazide (HYDRODIURIL) 25 MG tablet Take 1 tablet by mouth every morning       lisinopriL (ZESTRIL) 20 MG tablet TAKE ONE TABLET (20 MG DOSE) BY MOUTH DAILY.       metoprolol succinate (TOPROL-XL) 25 MG XL tablet Take 1 tablet by mouth once daily       tadalafiL (CIALIS) 5 MG tablet Take 5 mg by mouth once daily        No current facility-administered medications on file prior to visit.           Family History  Problem Relation Age of Onset   High blood pressure (Hypertension) Mother     Hyperlipidemia (Elevated cholesterol) Mother     Lung cancer Father        Social History       Tobacco Use  Smoking Status Never  Smokeless Tobacco Never      Social History        Socioeconomic History   Marital status: Married  Tobacco Use   Smoking status: Never   Smokeless tobacco: Never  Substance and Sexual Activity   Alcohol use: Yes   Drug use: Never      Objective:      BP 139/81   Pulse 75   Temp 97.9 F (36.6 C) (Oral)   Resp 18   Ht 5' 10.5" (1.791 m)   Wt 110.2 kg   SpO2 100%   BMI 34.37 kg/m     Exam Gen: NAD Abd: soft, IR drain with purulence noted in bag       Labs, Imaging and Diagnostic Testing: CT reviewed   Assessment and Plan:  Diagnoses and all orders for this visit:   Diverticular disease of left colon     Patient with a recent history of diverticulitis which resulted in pelvic abscess as well as multiple hepatic abscesses.  He is now recovered from this but his drain remains due to a persistent fistula.  He has completed a colonoscopy which showed no signs of malignancy.  I have recommended proceeding with robotic assisted partial colectomy.  Patient does have a ventral hernia.  We discussed primary hernia repair as well.   The surgery and anatomy were described to the patient as well as the risks of surgery and the  possible complications.  These include: Bleeding, deep abdominal infections and possible wound complications such as hernia and infection, damage to adjacent structures, leak of surgical connections, which can lead to other surgeries and possibly an ostomy, possible need for other procedures, such as abscess drains in radiology, possible prolonged hospital stay, possible diarrhea from removal of part of the colon, possible constipation from narcotics, possible bowel, bladder or sexual dysfunction if having rectal surgery, prolonged fatigue/weakness or appetite loss, possible early recurrence of of disease, possible complications of their medical problems such as heart disease or arrhythmias or lung problems, death (less than 1%). I believe the patient understands and wishes to proceed with the surgery.    No follow-ups on file.     Rosario Adie, MD Colon and Rectal Surgery [facility]

## 2021-12-27 NOTE — Anesthesia Procedure Notes (Signed)
Procedure Name: Intubation Date/Time: 12/27/2021 9:59 AM  Performed by: Victoriano Lain, CRNAPre-anesthesia Checklist: Patient identified, Emergency Drugs available, Suction available, Timeout performed and Patient being monitored Patient Re-evaluated:Patient Re-evaluated prior to induction Oxygen Delivery Method: Circle system utilized Preoxygenation: Pre-oxygenation with 100% oxygen Induction Type: IV induction Ventilation: Mask ventilation without difficulty and Oral airway inserted - appropriate to patient size Laryngoscope Size: Mac and 4 Grade View: Grade I Tube type: Oral Tube size: 7.5 mm Number of attempts: 1 Airway Equipment and Method: Stylet Placement Confirmation: ETT inserted through vocal cords under direct vision, positive ETCO2 and breath sounds checked- equal and bilateral Secured at: 22 cm Tube secured with: Tape Dental Injury: Teeth and Oropharynx as per pre-operative assessment

## 2021-12-27 NOTE — Anesthesia Postprocedure Evaluation (Signed)
Anesthesia Post Note  Patient: [name] [name]  Procedure(s) Performed: XI ROBOT ASSISTED LAPAROSCOPIC SIGMOIDECTOMY PRIMARY HERNIA REPAIR     Patient location during evaluation: PACU Anesthesia Type: General Level of consciousness: awake and alert Pain management: pain level controlled Vital Signs Assessment: post-procedure vital signs reviewed and stable Respiratory status: spontaneous breathing, nonlabored ventilation and respiratory function stable Cardiovascular status: blood pressure returned to baseline and stable Postop Assessment: no apparent nausea or vomiting Anesthetic complications: no   No notable events documented.  Last Vitals:  Vitals:   12/27/21 1300 12/27/21 1315  BP: (!) 143/88 (!) 130/100  Pulse: 67 66  Resp: 20 19  Temp:    SpO2: 98% 97%    Last Pain:  Vitals:   12/27/21 1300  TempSrc:   PainSc: 6                  Diera Wirkkala A.

## 2021-12-27 NOTE — Transfer of Care (Signed)
Immediate Anesthesia Transfer of Care Note  Patient: [name] [name]  Procedure(s) Performed: XI ROBOT ASSISTED LAPAROSCOPIC SIGMOIDECTOMY PRIMARY HERNIA REPAIR  Patient Location: PACU  Anesthesia Type:General  Level of Consciousness: awake, alert , oriented, and patient cooperative  Airway & Oxygen Therapy: Patient Spontanous Breathing and Patient connected to face mask oxygen  Post-op Assessment: Report given to RN, Post -op Vital signs reviewed and stable, and Patient moving all extremities  Post vital signs: Reviewed and stable  Last Vitals:  Vitals Value Taken Time  BP 140/82 12/27/21 1248  Temp 36.4 C 12/27/21 1245  Pulse 72 12/27/21 1249  Resp 23 12/27/21 1249  SpO2 100 % 12/27/21 1249  Vitals shown include unvalidated device data.  Last Pain:  Vitals:   12/27/21 0809  TempSrc: Oral         Complications: No notable events documented.

## 2021-12-27 NOTE — Op Note (Signed)
12/27/2021  12:22 PM  PATIENT:  [name] [name]  61 y.o. male  Patient Care Team: Bernerd Limbo, MD as PCP - General (Family Medicine)  PRE-OPERATIVE DIAGNOSIS:  INCISIONAL HERNIA, DIVERTICULAR DISEASE  POST-OPERATIVE DIAGNOSIS:  INCISIONAL HERNIA, DIVERTICULAR DISEASE  PROCEDURE:  XI ROBOT ASSISTED SIGMOIDECTOMY PRIMARY HERNIA REPAIR   Surgeon(s): Leighton Ruff, MD Ileana Roup, MD  ASSISTANT: Dr Dema Severin   ANESTHESIA:   local and general  EBL: 36m Total I/O In: 1100 [I.V.:1000; IV Piggyback:100] Out: -   HERNIA SIZE: 4x3cm  Delay start of Pharmacological VTE agent (>24hrs) due to surgical blood loss or risk of bleeding:  no  DRAINS: none   SPECIMEN:  Source of Specimen:  Rectosigmoid  DISPOSITION OF SPECIMEN:  PATHOLOGY  COUNTS:  YES  PLAN OF CARE: Admit to inpatient   PATIENT DISPOSITION:  PACU - hemodynamically stable.  INDICATION:    I recommended segmental resection:  The anatomy & physiology of the digestive tract was discussed.  The pathophysiology was discussed.  Natural history risks without surgery was discussed.   I worked to give an overview of the disease and the frequent need to have multispecialty involvement.  I feel the risks of no intervention will lead to serious problems that outweigh the operative risks; therefore, I recommended a partial colectomy to remove the pathology.  Laparoscopic & open techniques were discussed.   Risks such as bleeding, infection, abscess, leak, reoperation, possible ostomy, hernia, heart attack, death, and other risks were discussed.  I noted a good likelihood this will help address the problem.   Goals of post-operative recovery were discussed as well.    The patient expressed understanding & wished to proceed with surgery.  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  DESCRIPTION:    Informed consent was confirmed.  The patient underwent general anaesthesia without difficulty.  The patient was positioned appropriately.  VTE prevention in place.  The patient's abdomen was clipped, prepped, & draped in a sterile fashion.  Surgical timeout confirmed our plan.  The patient was positioned in reverse Trendelenburg.  Abdominal entry was gained using a Varies needle.  Entry was clean.  I induced carbon dioxide insufflation.  An 856mrobotic port was placed in the RUQ.  Camera inspection revealed no injury.  Extra ports were carefully placed under direct laparoscopic visualization.  I laparoscopically reflected the greater omentum and the upper abdomen the small bowel in the upper abdomen. The patient was appropriately positioned and the robot was docked to the patient's left side.  Instruments were placed under direct visualization.    I mobilized the sigmoid colon off of the pelvic sidewall. There were significant adhesions to the anterior peritoneum.  Blunt dissection was used to separate the diseased colon from the pelvic sidewall.  I scored the base of peritoneum of the right side of the mesentery of the left colon from the ligament of Treitz to the peritoneal reflection of the mid rectum.   I elevated the sigmoid mesentery and enetered into the retro-mesenteric plane. We were able to identify the left ureter and gonadal vessels. We kept those posterior within the retroperitoneum and elevated the left colon mesentery off that. I did isolated IMA pedicle but did not ligate it yet.  I continued distally and got into the avascular plane posterior to the mesorectum. This allowed me to help mobilize the rectum as well by  freeing the mesorectum off the sacrum.  I mobilized the peritoneal coverings towards the peritoneal reflection on both the right and left sides of the rectum.  I could see the right and left ureters and stayed away from them.    I skeletonized the inferior mesenteric artery  pedicle.  After confirming the left ureter was out of the way, I went ahead and ligated the inferior mesenteric artery pedicle with bipolar robotic vessel sealer well above its takeoff from the aorta.  We ensured hemostasis. I skeletonized the mesorectum at the junction at the proximal rectum using blunt dissection & bipolar robotic vessel sealer.  I mobilized the left colon in a lateral to medial fashion off the line of Toldt up towards the splenic flexure to ensure good mobilization of the left colon to reach into the pelvis.  I divided the mesentery to the level of the descending sigmoid junction using the vessel sealer.  I confirmed good mobilization of the colon to reach the rectal stump.   We made an incision through the patient's previous periumbilical wound and placed a 12 mm robotic port.  The green load 60 mm stapler was introduced and the rectosigmoid junction was divided.  At this point, the robot was undocked.   The 12 mm port was enlarged to a small incision about the size of the hernia.  An [place] wound protector was placed.  The colon was brought out through the wound protector and divided over a pursestring device proximally.  There was good bleeding noted in the edges of the divided colon.  Several 3-0 silk sutures were used to secure the pursestring suture to the colon.  A 31 mm EEA anvil was placed in the pursestring was tied tightly around this.  The pericolonic fat was cleared from the colon resting on the anvil plate.  This was then placed back into the abdomen.  An anastomosis was created through the rectal stump in end-to-end fashion under laparoscopic visualization.  There was no tension noted on the anastomosis.  There was no leak when tested with insufflation under irrigation.  The anastomosis rest approximately 15 cm from the sphincter complex.  We then switched to clean gowns, gloves, instruments and drapes.  I began by mobilizing the fascia of the incisional hernia using  electrocautery.  The hernia measured approximately 3 x 4 cm.  Once this was complete the fascia was closed using interrupted #1 Novafil's.  The subcutaneous tissue was reapproximated over this using a running 2-0 Vicryl suture and the skin was closed using a running 4-0 Vicryl suture.  A sterile dressing was applied.  The remaining port sites were closed using interrupted 4-0 Vicryl sutures and Dermabond.  The patient was then awakened from anesthesia and sent to the postanesthesia care unit in stable condition.  All counts were correct per operating room staff.  An MD assistant was necessary for tissue manipulation, retraction and positioning due to the complexity of the case and hospital policies.  Rosario Adie, MD  Col[facility] [place] [facility]

## 2021-12-27 NOTE — Anesthesia Preprocedure Evaluation (Addendum)
Anesthesia Evaluation  Patient identified by MRN, date of birth, ID band Patient awake    Reviewed: Allergy & Precautions, NPO status , Patient's Chart, lab work & pertinent test results, reviewed documented beta blocker date and time   Airway Mallampati: II  TM Distance: >3 FB Neck ROM: Full    Dental  (+) Dental Advisory Given, Partial Lower, Partial Upper   Pulmonary sleep apnea  No longer on CPAP since weight loss   Pulmonary exam normal breath sounds clear to auscultation       Cardiovascular hypertension, Pt. on medications and Pt. on home beta blockers + CAD, + Past MI and + Cardiac Stents  Normal cardiovascular exam Rhythm:Regular Rate:Normal  EKG 09/05/21 NSR, RBBB pattern  Echo 12/12/13 Left ventricle: The cavity size was normal. Systolic function was    normal. The estimated ejection fraction was in the range of 50%    to 55%. Wall motion was normal; there were no regional wall    motion abnormalities. Doppler parameters are consistent with    abnormal left ventricular relaxation (grade 1 diastolic    dysfunction). There was no evidence of elevated ventricular    filling pressure by Doppler parameters.  - Aortic valve: Trileaflet; normal thickness leaflets. There was no    regurgitation.  - Mitral valve: There was no regurgitation.  - Right ventricle: Systolic function was normal.  - Right atrium: The atrium was normal in size.  - Tricuspid valve: There was no regurgitation.  - Pulmonic valve: There was trivial regurgitation.  - Pulmonary arteries: Systolic pressure couldn&'t be assessed.  - Pericardium, extracardiac: There was no pericardial effusion.   Inferior MI 2005 Stent RCA - occluded  Cardiac cath 2009- stent LAD    Neuro/Psych negative neurological ROS  negative psych ROS   GI/Hepatic Neg liver ROS,,,Diverticular disease   Endo/Other  Obesity Hyperlipidemia  Renal/GU Renal InsufficiencyRenal  disease   Hx/o Prostate Ca S/P robotic prostatectomy 2021    Musculoskeletal  (+) Arthritis , Osteoarthritis,  Incisional hernia   Abdominal  (+) + obese  Peds  Hematology negative hematology ROS (+)   Anesthesia Other Findings   Reproductive/Obstetrics                             Anesthesia Physical Anesthesia Plan  ASA: 3  Anesthesia Plan: General   Post-op Pain Management: Tylenol PO (pre-op)* and Precedex   Induction:   PONV Risk Score and Plan: 4 or greater and Treatment may vary due to age or medical condition, Midazolam, Ondansetron and Dexamethasone  Airway Management Planned: Oral ETT  Additional Equipment: None  Intra-op Plan:   Post-operative Plan: Extubation in OR  Informed Consent: I have reviewed the patients History and Physical, chart, labs and discussed the procedure including the risks, benefits and alternatives for the proposed anesthesia with the patient or authorized representative who has indicated his/her understanding and acceptance.     Dental advisory given  Plan Discussed with: CRNA and Anesthesiologist  Anesthesia Plan Comments:         Anesthesia Quick Evaluation

## 2021-12-28 ENCOUNTER — Encounter (HOSPITAL_COMMUNITY): Payer: Self-pay | Admitting: General Surgery

## 2021-12-28 LAB — BASIC METABOLIC PANEL
Anion gap: 6 (ref 5–15)
BUN: 18 mg/dL (ref 8–23)
CO2: 24 mmol/L (ref 22–32)
Calcium: 8.2 mg/dL — ABNORMAL LOW (ref 8.9–10.3)
Chloride: 105 mmol/L (ref 98–111)
Creatinine, Ser: 1.24 mg/dL (ref 0.61–1.24)
GFR, Estimated: >60 mL/min (ref >60)
Glucose, Bld: 165 mg/dL — ABNORMAL HIGH (ref 70–99)
Potassium: 4.3 mmol/L (ref 3.5–5.1)
Sodium: 135 mmol/L (ref 135–145)

## 2021-12-28 LAB — CBC
HCT: 43 % (ref 39.0–52.0)
Hemoglobin: 14.7 g/dL (ref 13.0–17.0)
MCH: 30.8 pg (ref 26.0–34.0)
MCHC: 34.2 g/dL (ref 30.0–36.0)
MCV: 90.1 fL (ref 80.0–100.0)
Platelets: 159 10*3/uL (ref 150–400)
RBC: 4.77 MIL/uL (ref 4.22–5.81)
RDW: 12.8 % (ref 11.5–15.5)
WBC: 13.9 10*3/uL — ABNORMAL HIGH (ref 4.0–10.5)
nRBC: 0 % (ref 0.0–0.2)

## 2021-12-28 MED ORDER — TRAMADOL HCL 50 MG PO TABS
50.0000 mg | ORAL_TABLET | Freq: Four times a day (QID) | ORAL | Status: DC | PRN
  Administered 2021-12-28: 50 mg via ORAL
  Filled 2021-12-28: qty 1

## 2021-12-28 MED ORDER — TRAMADOL HCL 50 MG PO TABS: 50.0000 mg | ORAL_TABLET | Freq: Four times a day (QID) | ORAL | 0 refills | Status: AC | PRN

## 2021-12-28 NOTE — Progress Notes (Signed)
1 Day Post-Op Rob Sig Subjective: Doing well, having BM's, pain controlled  Objective: Vital signs in last 24 hours: Temp:  [97.5 F (36.4 C)-98.3 F (36.8 C)] 97.9 F (36.6 C) (11/10 0623) Pulse Rate:  [63-87] 70 (11/10 0623) Resp:  [13-20] 18 (11/10 0623) BP: (106-156)/(67-100) 114/68 (11/10 0623) SpO2:  [89 %-100 %] 97 % (11/10 0623) Weight:  [588 kg] 116 kg (11/10 0500)   Intake/Output from previous day: 11/09 0701 - 11/10 0700 In: 3830.5 [P.O.:1200; I.V.:2430.5; IV Piggyback:200] Out: 2250 [Urine:2200; Blood:50] Intake/Output this shift: Total I/O In: 240 [P.O.:240] Out: -    General appearance: alert and cooperative GI: soft, nondistended  Incision: no significant drainage  Lab Results:  Recent Labs    12/28/21 0418  WBC 13.9*  HGB 14.7  HCT 43.0  PLT 159   BMET Recent Labs    12/28/21 0418  NA 135  K 4.3  CL 105  CO2 24  GLUCOSE 165*  BUN 18  CREATININE 1.24  CALCIUM 8.2*   PT/INR No results for input(s): "LABPROT", "INR" in the last 72 hours. ABG No results for input(s): "PHART", "HCO3" in the last 72 hours.  Invalid input(s): "PCO2", "PO2"  MEDS, Scheduled  acetaminophen  1,000 mg Oral Q6H   allopurinol  300 mg Oral Daily   alvimopan  12 mg Oral BID   enoxaparin (LOVENOX) injection  40 mg Subcutaneous Q24H   feeding supplement  237 mL Oral BID BM   gabapentin  300 mg Oral BID   hydrochlorothiazide  25 mg Oral Daily   lisinopril  20 mg Oral Daily   metoprolol succinate  25 mg Oral Daily   saccharomyces boulardii  250 mg Oral BID    Studies/Results: No results found.  Assessment: s/p Procedure(s): XI ROBOT ASSISTED LAPAROSCOPIC SIGMOIDECTOMY PRIMARY HERNIA REPAIR Patient Active Problem List   Diagnosis Date Noted   Diverticular disease 12/27/2021   Liver abscess 09/03/2021   Perforation of sigmoid colon due to diverticulitis 09/03/2021   Perforated diverticulum 08/27/2021   Prostate cancer ([place]) 06/09/2019   History of  gout 10/05/2018   Elevated PSA 09/24/2018   History of total knee arthroplasty 07/31/2018   Severe obesity (BMI 35.0-39.9) with comorbidity ([place]) 04/15/2018   Bilateral impacted cerumen 12/31/2017   Dysphonia 12/31/2017   Aftercare 12/08/2017   Traumatic arthritis of knee, left 10/23/2017   Traumatic arthritis of left knee 10/23/2017   Primary osteoarthritis of left knee 10/07/2017   Abnormal nuclear cardiac imaging test 09/24/2017   Ejection fraction < 50% 09/24/2017   Pain in left knee 07/01/2017   Obesity (BMI 30-39.9) 05/25/2014   OSA (obstructive sleep apnea) 03/01/2014   CAD (coronary artery disease) 10/17/2010   HTN (hypertension) 10/17/2010   Hyperlipidemia 10/17/2010   Situational stress 10/17/2010    Expected post op course  Plan: d/c foley Advance diet SL IVF Ambulate  PO pain meds   LOS: 1 day     .Rosario Adie, [place] [facility], [place]    12/28/2021 8:38 AM

## 2021-12-28 NOTE — Progress Notes (Signed)
Pharmacy Brief Note - Alvimopan (Entereg)  The standing order set for alvimopan (Entereg) now includes an automatic order to discontinue the drug after the patient has had a bowel movement. The change was approved by the [place] and the Medical Executive Committee.   This patient has had bowel movements documented by nursing. Therefore, alvimopan has been discontinued. If there are questions, please contact the pharmacy at [phone].   Thank you- Peggyann Juba, PharmD, BCPS

## 2021-12-28 NOTE — Progress Notes (Signed)
  Transition of Care [facility]) Screening Note   Patient Details  Name: NYKO GELL Date of Birth: 1960/03/28   Transition of Care [facility]) CM/SW Contact:    Lennart Pall, LCSW Phone Number: 12/28/2021, 4:01 PM    Transition of Care Department [facility]) has reviewed patient and no TOC needs have been identified at this time. We will continue to monitor patient advancement through interdisciplinary progression rounds. If new patient transition needs arise, please place a TOC consult.

## 2021-12-28 NOTE — Discharge Instructions (Signed)
SURGERY: POST OP INSTRUCTIONS (Surgery for small bowel obstruction, colon resection, etc)   ######################################################################  EAT Gradually transition to a high fiber diet with a fiber supplement over the next few days after discharge  WALK Walk an hour a day.  Control your pain to do that.    CONTROL PAIN Control pain so that you can walk, sleep, tolerate sneezing/coughing, go up/down stairs.  HAVE A BOWEL MOVEMENT DAILY Keep your bowels regular to avoid problems.  OK to try a laxative to override constipation.  OK to use an antidairrheal to slow down diarrhea.  Call if not better after 2 tries  CALL IF YOU HAVE PROBLEMS/CONCERNS Call if you are still struggling despite following these instructions. Call if you have concerns not answered by these instructions  ######################################################################   DIET Follow a light diet the first few days at home.  Start with a bland diet such as soups, liquids, starchy foods, low fat foods, etc.  If you feel full, bloated, or constipated, stay on a ful liquid or pureed/blenderized diet for a few days until you feel better and no longer constipated. Be sure to drink plenty of fluids every day to avoid getting dehydrated (feeling dizzy, not urinating, etc.). Gradually add a fiber supplement to your diet over the next week.  Gradually get back to a regular solid diet.  Avoid fast food or heavy meals the first week as you are more likely to get nauseated. It is expected for your digestive tract to need a few months to get back to normal.  It is common for your bowel movements and stools to be irregular.  You will have occasional bloating and cramping that should eventually fade away.  Until you are eating solid food normally, off all pain medications, and back to regular activities; your bowels will not be normal. Focus on eating a low-fat, high fiber diet the rest of your life  (See Getting to Good Bowel Health, below).  CARE of your INCISION or WOUND  It is good for closed incisions and even open wounds to be washed every day.  Shower every day.  Short baths are fine.  Wash the incisions and wounds clean with soap & water.    You may leave closed incisions open to air if it is dry.   You may cover the incision with clean gauze & replace it after your daily shower for comfort.  STAPLES: You have skin staples.  Leave them in place & set up an appointment for them to be removed by a surgery office nurse ~10 days after surgery. = 1st week of January 2024    ACTIVITIES as tolerated Start light daily activities --- self-care, walking, climbing stairs-- beginning the day after surgery.  Gradually increase activities as tolerated.  Control your pain to be active.  Stop when you are tired.  Ideally, walk several times a day, eventually an hour a day.   Most people are back to most day-to-day activities in a few weeks.  It takes 4-8 weeks to get back to unrestricted, intense activity. If you can walk 30 minutes without difficulty, it is safe to try more intense activity such as jogging, treadmill, bicycling, low-impact aerobics, swimming, etc. Save the most intensive and strenuous activity for last (Usually 4-8 weeks after surgery) such as sit-ups, heavy lifting, contact sports, etc.  Refrain from any intense heavy lifting or straining until you are off narcotics for pain control.  You will have off days, but things should improve   week-by-week. DO NOT PUSH THROUGH PAIN.  Let pain be your guide: If it hurts to do something, don't do it.  Pain is your body warning you to avoid that activity for another week until the pain goes down. You may drive when you are no longer taking narcotic prescription pain medication, you can comfortably wear a seatbelt, and you can safely make sudden turns/stops to protect yourself without hesitating due to pain. You may have sexual intercourse when it  is comfortable. If it hurts to do something, stop.  MEDICATIONS Take your usually prescribed home medications unless otherwise directed.   Blood thinners:  Usually you can restart any strong blood thinners after the second postoperative day.  It is OK to take aspirin right away.     If you are on strong blood thinners (warfarin/Coumadin, Plavix, Xerelto, Eliquis, Pradaxa, etc), discuss with your surgeon, medicine PCP, and/or cardiologist for instructions on when to restart the blood thinner & if blood monitoring is needed (PT/INR blood check, etc).     PAIN CONTROL Pain after surgery or related to activity is often due to strain/injury to muscle, tendon, nerves and/or incisions.  This pain is usually short-term and will improve in a few months.  To help speed the process of healing and to get back to regular activity more quickly, DO THE FOLLOWING THINGS TOGETHER: Increase activity gradually.  DO NOT PUSH THROUGH PAIN Use Ice and/or Heat Try Gentle Massage and/or Stretching Take over the counter pain medication Take Narcotic prescription pain medication for more severe pain  Good pain control = faster recovery.  It is better to take more medicine to be more active than to stay in bed all day to avoid medications.  Increase activity gradually Avoid heavy lifting at first, then increase to lifting as tolerated over the next 6 weeks. Do not "push through" the pain.  Listen to your body and avoid positions and maneuvers than reproduce the pain.  Wait a few days before trying something more intense Walking an hour a day is encouraged to help your body recover faster and more safely.  Start slowly and stop when getting sore.  If you can walk 30 minutes without stopping or pain, you can try more intense activity (running, jogging, aerobics, cycling, swimming, treadmill, sex, sports, weightlifting, etc.) Remember: If it hurts to do it, then don't do it! Use Ice and/or Heat You will have swelling and  bruising around the incisions.  This will take several weeks to resolve. Ice packs or heating pads (6-8 times a day, 30-60 minutes at a time) will help sooth soreness & bruising. Some people prefer to use ice alone, heat alone, or alternate between ice & heat.  Experiment and see what works best for you.  Consider trying ice for the first few days to help decrease swelling and bruising; then, switch to heat to help relax sore spots and speed recovery. Shower every day.  Short baths are fine.  It feels good!  Keep the incisions and wounds clean with soap & water.   Try Gentle Massage and/or Stretching Massage at the area of pain many times a day Stop if you feel pain - do not overdo it Take over the counter pain medication This helps the muscle and nerve tissues become less irritable and calm down faster Choose ONE of the following over-the-counter anti-inflammatory medications: Acetaminophen 500mg tabs (Tylenol) 1-2 pills with every meal and just before bedtime (avoid if you have liver problems or if you have   acetaminophen in you narcotic prescription) Naproxen 220mg tabs (ex. Aleve, Naprosyn) 1-2 pills twice a day (avoid if you have kidney, stomach, IBD, or bleeding problems) Ibuprofen 200mg tabs (ex. Advil, Motrin) 3-4 pills with every meal and just before bedtime (avoid if you have kidney, stomach, IBD, or bleeding problems) Take with food/snack several times a day as directed for at least 2 weeks to help keep pain / soreness down & more manageable. Take Narcotic prescription pain medication for more severe pain A prescription for strong pain control is often given to you upon discharge (for example: oxycodone/Percocet, hydrocodone/Norco/Vicodin, or tramadol/Ultram) Take your pain medication as prescribed. Be mindful that most narcotic prescriptions contain Tylenol (acetaminophen) as well - avoid taking too much Tylenol. If you are having problems/concerns with the prescription medicine (does  not control pain, nausea, vomiting, rash, itching, etc.), please call us [phone] to see if we need to switch you to a different pain medicine that will work better for you and/or control your side effects better. If you need a refill on your pain medication, you must call the office before 4 pm and on weekdays only.  By federal law, prescriptions for narcotics cannot be called into a pharmacy.  They must be filled out on paper & picked up from our office by the patient or authorized caretaker.  Prescriptions cannot be filled after 4 pm nor on weekends.    WHEN TO CALL US [phone] Severe uncontrolled or worsening pain  Fever over 101 F (38.5 C) Concerns with the incision: Worsening pain, redness, rash/hives, swelling, bleeding, or drainage Reactions / problems with new medications (itching, rash, hives, nausea, etc.) Nausea and/or vomiting Difficulty urinating Difficulty breathing Worsening fatigue, dizziness, lightheadedness, blurred vision Other concerns If you are not getting better after two weeks or are noticing you are getting worse, contact our office [phone] for further advice.  We may need to adjust your medications, re-evaluate you in the office, send you to the emergency room, or see what other things we can do to help. The clinic staff is available to answer your questions during regular business hours (8:30am-5pm).  Please don't hesitate to call and ask to speak to one of our nurses for clinical concerns.    A surgeon from [facility] is always on call at the hospitals 24 hours/day If you have a medical emergency, go to the nearest emergency room or call 911.  FOLLOW UP in our office One the day of your discharge from the hospital (or the next business weekday), please call [facility] to set up or confirm an appointment to see your surgeon in the office for a follow-up appointment.  Usually it is 2-3 weeks after your surgery.   If you  have skin staples at your incision(s), let the office know so we can set up a time in the office for the nurse to remove them (usually around 10 days after surgery). Make sure that you call for appointments the day of discharge (or the next business weekday) from the hospital to ensure a convenient appointment time. IF YOU HAVE DISABILITY OR FAMILY LEAVE FORMS, BRING THEM TO THE OFFICE FOR PROCESSING.  DO NOT GIVE THEM TO YOUR DOCTOR.  [facility] [address], [address], [place], [place]  [address] ? [phone] - Main [phone] - Toll Free,  [phone] - Fax [email or web address]    GETTING TO GOOD BOWEL HEALTH. It is expected for your digestive tract to   need a few months to get back to normal.  It is common for your bowel movements and stools to be irregular.  You will have occasional bloating and cramping that should eventually fade away.  Until you are eating solid food normally, off all pain medications, and back to regular activities; your bowels will not be normal.   Avoiding constipation The goal: ONE SOFT BOWEL MOVEMENT A DAY!    Drink plenty of fluids.  Choose water first. TAKE A FIBER SUPPLEMENT EVERY DAY THE REST OF YOUR LIFE During your first week back home, gradually add back a fiber supplement every day Experiment which form you can tolerate.   There are many forms such as powders, tablets, wafers, gummies, etc Psyllium bran (Metamucil), methylcellulose (Citrucel), Miralax or Glycolax, Benefiber, Flax Seed.  Adjust the dose week-by-week (1/2 dose/day to 6 doses a day) until you are moving your bowels 1-2 times a day.  Cut back the dose or try a different fiber product if it is giving you problems such as diarrhea or bloating. Sometimes a laxative is needed to help jump-start bowels if constipated until the fiber supplement can help regulate your bowels.  If you are tolerating eating & you are farting, it is okay to try a gentle  laxative such as double dose MiraLax, prune juice, or Milk of Magnesia.  Avoid using laxatives too often. Stool softeners can sometimes help counteract the constipating effects of narcotic pain medicines.  It can also cause diarrhea, so avoid using for too long. If you are still constipated despite taking fiber daily, eating solids, and a few doses of laxatives, call our office. Controlling diarrhea Try drinking liquids and eating bland foods for a few days to avoid stressing your intestines further. Avoid dairy products (especially milk & ice cream) for a short time.  The intestines often can lose the ability to digest lactose when stressed. Avoid foods that cause gassiness or bloating.  Typical foods include beans and other legumes, cabbage, broccoli, and dairy foods.  Avoid greasy, spicy, fast foods.  Every person has some sensitivity to other foods, so listen to your body and avoid those foods that trigger problems for you. Probiotics (such as active yogurt, Align, etc) may help repopulate the intestines and colon with normal bacteria and calm down a sensitive digestive tract Adding a fiber supplement gradually can help thicken stools by absorbing excess fluid and retrain the intestines to act more normally.  Slowly increase the dose over a few weeks.  Too much fiber too soon can backfire and cause cramping & bloating. It is okay to try and slow down diarrhea with a few doses of antidiarrheal medicines.   Bismuth subsalicylate (ex. Kayopectate, Pepto Bismol) for a few doses can help control diarrhea.  Avoid if pregnant.   Loperamide (Imodium) can slow down diarrhea.  Start with one tablet (2mg) first.  Avoid if you are having fevers or severe pain.  ILEOSTOMY PATIENTS WILL HAVE CHRONIC DIARRHEA since their colon is not in use.    Drink plenty of liquids.  You will need to drink even more glasses of water/liquid a day to avoid getting dehydrated. Record output from your ileostomy.  Expect to empty  the bag every 3-4 hours at first.  Most people with a permanent ileostomy empty their bag 4-6 times at the least.   Use antidiarrheal medicine (especially Imodium) several times a day to avoid getting dehydrated.  Start with a dose at bedtime & breakfast.  Adjust up or   down as needed.  Increase antidiarrheal medications as directed to avoid emptying the bag more than 8 times a day (every 3 hours). Work with your wound ostomy nurse to learn care for your ostomy.  See ostomy care instructions. TROUBLESHOOTING IRREGULAR BOWELS 1) Start with a soft & bland diet. No spicy, greasy, or fried foods.  2) Avoid gluten/wheat or dairy products from diet to see if symptoms improve. 3) Miralax 17gm or flax seed mixed in 8oz. water or juice-daily. May use 2-4 times a day as needed. 4) Gas-X, Phazyme, etc. as needed for gas & bloating.  5) Prilosec (omeprazole) over-the-counter as needed 6)  Consider probiotics (Align, Activa, etc) to help calm the bowels down  Call your doctor if you are getting worse or not getting better.  Sometimes further testing (cultures, endoscopy, X-ray studies, CT scans, bloodwork, etc.) may be needed to help diagnose and treat the cause of the diarrhea. [facility] [address], [address], [place], [place]  [address] [phone] - Main.    [phone]  - Toll Free.   [phone] - Fax [email or web address]   ###############################   #######################################################  Ostomy Support Information  You've heard that people get along just fine with only one of their eyes, or one of their lungs, or one of their kidneys. But you also know that you have only one intestine and only one bladder, and that leaves you feeling awfully empty, both physically and emotionally: You think no other people go around without part of their intestine with the ends of their intestines sticking out through their abdominal walls.    YOU ARE NOT ALONE.  There are nearly three quarters of a million people in the [place] who have an ostomy; people who have had surgery to remove all or part of their colons or bladders.   There is even a national association, the [employer] with over 350 local affiliated support groups that are organized by volunteers who provide peer support and counseling. [employer] has a toll free telephone num-ber, 800-826-0826 and an educational, interactive website, www.ostomy.org   An ostomy is an opening in the belly (abdominal wall) made by surgery. Ostomates are people who have had this procedure. The opening (stoma) allows the kidney or bowel to grdischarge waste. An external pouch covers the stoma to collect waste. Pouches are are a simple bag and are odor free. Different companies have disposable or reusable pouches to fit one's lifestyle. An ostomy can either be temporary or permanent.   THERE ARE THREE MAIN TYPES OF OSTOMIES Colostomy. A colostomy is a surgically created opening in the large intestine (colon). Ileostomy. An ileostomy is a surgically created opening in the small intestine. Urostomy. A urostomy is a surgically created opening to divert urine away from the bladder.  OSTOMY Care  The following guidelines will make care of your colostomy easier. Keep this information close by for quick reference.  Helpful DIET hints Eat a well-balanced diet including vegetables and fresh fruits. Eat on a regular schedule.  Drink at least 6 to 8 glasses of fluids daily. Eat slowly in a relaxed atmosphere. Chew your food thoroughly. Avoid chewing gum, smoking, and drinking from a straw. This will help decrease the amount of air you swallow, which may help reduce gas. Eating yogurt or drinking buttermilk may help reduce gas.  To control gas at night, do not eat after 8 p.m. This will give your bowel time to quiet down before you go   to bed.  If gas is a problem, you can purchase  Beano. Sprinkle Beano on the first bite of food before eating to reduce gas. It has no flavor and should not change the taste of your food. You can buy Beano over the counter at your local drugstore.  Foods like fish, onions, garlic, broccoli, asparagus, and cabbage produce odor. Although your pouch is odor-proof, if you eat these foods you may notice a stronger odor when emptying your pouch. If this is a concern, you may want to limit these foods in your diet.  If you have an ileostomy, you will have chronic diarrhea & need to drink more liquids to avoid getting dehydrated.  Consider antidiarrheal medicine like imodium (loperamide) or Lomotil to help slow down bowel movements / diarrhea into your ileostomy bag.  GETTING TO GOOD BOWEL HEALTH WITH AN ILEOSTOMY    With the colon bypassed & not in use, you will have small bowel diarrhea.   It is important to thicken & slow your bowel movements down.   The goal: 4-6 small BOWEL MOVEMENTS A DAY It is important to drink plenty of liquids to avoid getting dehydrated  CONTROLLING ILEOSTOMY DIARRHEA  TAKE A FIBER SUPPLEMENT (FiberCon or Benefiner soluble fiber) twice a day - to thicken stools by absorbing excess fluid and retrain the intestines to act more normally.  Slowly increase the dose over a few weeks.  Too much fiber too soon can backfire and cause cramping & bloating.  TAKE AN IRON SUPPLEMENT twice a day to naturally constipate your bowels.  Usually ferrous sulfate 325mg twice a day)  TAKE ANTI-DIARRHEAL MEDICINES: Loperamide (Imodium) can slow down diarrhea.  Start with two tablets (= 4mg) first and then try one tablet every 6 hours.  Can go up to 2 pills four times day (8 pills of 2mg max) Avoid if you are having fevers or severe pain.  If you are not better or start feeling worse, stop all medicines and call your doctor for advice LoMotil (Diphenoxylate / Atropine) is another medicine that can constipate & slow down bowel moevements Pepto  Bismol (bismuth) can gently thicken bowels as well  If diarrhea is worse,: drink plenty of liquids and try simpler foods for a few days to avoid stressing your intestines further. Avoid dairy products (especially milk & ice cream) for a short time.  The intestines often can lose the ability to digest lactose when stressed. Avoid foods that cause gassiness or bloating.  Typical foods include beans and other legumes, cabbage, broccoli, and dairy foods.  Every person has some sensitivity to other foods, so listen to our body and avoid those foods that trigger problems for you.Call your doctor if you are getting worse or not better.  Sometimes further testing (cultures, endoscopy, X-ray studies, bloodwork, etc) may be needed to help diagnose and treat the cause of the diarrhea. Take extra anti-diarrheal medicines (maximum is 8 pills of 2mg loperamide a day)   Tips for POUCHING an OSTOMY   Changing Your Pouch The best time to change your pouch is in the morning, before eating or drinking anything. Your stoma can function at any time, but it will function more after eating or drinking.   Applying the pouching system  Place all your equipment close at hand before removing your pouch.  Wash your hands.  Stand or sit in front of a mirror. Use the position that works best for you. Remember that you must keep the skin around the stoma   wrinkle-free for a good seal.  Gently remove the used pouch (1-piece system) or the pouch and old wafer (2-piece system). Empty the pouch into the toilet. Save the closure clip to use again.  Wash the stoma itself and the skin around the stoma. Your stoma may bleed a little when being washed. This is normal. Rinse and pat dry. You may use a wash cloth or soft paper towels (like Bounty), mild soap (like Dial, Safeguard, or Ivory), and water. Avoid soaps that contain perfumes or lotions.  For a new pouch (1-piece system) or a new wafer (2-piece system), measure your  stoma using the stoma guide in each box of supplies.  Trace the shape of your stoma onto the back of the new pouch or the back of the new wafer. Cut out the opening. Remove the paper backing and set it aside.  Optional: Apply a skin barrier powder to surrounding skin if it is irritated (bare or weeping), and dust off the excess. Optional: Apply a skin-prep wipe (such as Skin Prep or All-Kare) to the skin around the stoma, and let it dry. Do not apply this solution if the skin is irritated (red, tender, or broken) or if you have shaved around the stoma. Optional: Apply a skin barrier paste (such as Stomahesive, Coloplast, or Premium) around the opening cut in the back of the pouch or wafer. Allow it to dry for 30 to 60 seconds.  Hold the pouch (1-piece system) or wafer (2-piece system) with the sticky side toward your body. Make sure the skin around the stoma is wrinkle-free. Center the opening on the stoma, then press firmly to your abdomen (Fig. 4). Look in the mirror to check if you are placing the pouch, or wafer, in the right position. For a 2-piece system, snap the pouch onto the wafer. Make sure it snaps into place securely.  Place your hand over the stoma and the pouch or wafer for about 30 seconds. The heat from your hand can help the pouch or wafer stick to your skin.  Add deodorant (such as Super Banish or Nullo) to your pouch. Other options include food extracts such as vanilla oil and peppermint extract. Add about 10 drops of the deodorant to the pouch. Then apply the closure clamp. Note: Do not use toxic  chemicals or commercial cleaning agents in your pouch. These substances may harm the stoma.  Optional: For extra seal, apply tape to all 4 sides around the pouch or wafer, as if you were framing a picture. You may use any brand of medical adhesive tape. Change your pouch every 5 to 7 days. Change it immediately if a leak occurs.  Wash your hands afterwards.  If you are wearing a  2-piece system, you may use 2 new pouches per week and alternate them. Rinse the pouch with mild soap and warm water and hang it to dry for the next day. Apply the fresh pouch. Alternate the 2 pouches like this for a week. After a week, change the wafer and begin with 2 new pouches. Place the old pouches in a plastic bag, and put them in the trash.   LIVING WITH AN OSTOMY  Emptying Your Pouch Empty your pouch when it is one-third full (of urine, stool, and/or gas). If you wait until your pouch is fuller than this, it will be more difficult to empty and more noticeable. When you empty your pouch, either put toilet paper in the toilet bowl first, or flush the   toilet while you empty the pouch. This will reduce splashing. You can empty the pouch between your legs or to one side while sitting, or while standing or stooping. If you have a 2-piece system, you can snap off the pouch to empty it. Remember that your stoma may function during this time. If you wish to rinse your pouch after you empty it, a turkey baster can be helpful. When using a baster, squirt water up into the pouch through the opening at the bottom. With a 2-piece system, you can snap off the pouch to rinse it. After rinsing  your pouch, empty it into the toilet. When rinsing your pouch at home, put a few granules of Dreft soap in the rinse water. This helps lubricate and freshen your pouch. The inside of your pouch can be sprayed with non-stick cooking oil (Pam spray). This may help reduce stool sticking to the inside of the pouch.  Bathing You may shower or bathe with your pouch on or off. Remember that your stoma may function during this time.  The materials you use to wash your stoma and the skin around it should be clean, but they do not need to be sterile.  Wearing Your Pouch During hot weather, or if you perspire a lot in general, wear a cover over your pouch. This may prevent a rash on your skin under the pouch. Pouch covers are  sold at ostomy supply stores. Wear the pouch inside your underwear for better support. Watch your weight. Any gain or loss of 10 to 15 pounds or more can change the way your pouch fits.  Going Away From Home A collapsible cup (like those that come in travel kits) or a soft plastic squirt bottle with a pull-up top (like a travel bottle for shampoo) can be used for rinsing your pouch when you are away from home. Tilt the opening of the pouch at an upward angle when using a cup to rinse.  Carry wet wipes or extra tissues to use in public bathrooms.  Carry an extra pouching system with you at all times.  Never keep ostomy supplies in the glove compartment of your car. Extreme heat or cold can damage the skin barriers and adhesive wafers on the pouch.  When you travel, carry your ostomy supplies with you at all times. Keep them within easy reach. Do not pack ostomy supplies in baggage that will be checked or otherwise separated from you, because your baggage might be lost. If you're traveling out of the country, it is helpful to have a letter stating that you are carrying ostomy supplies as a medical necessity.  If you need ostomy supplies while traveling, look in the yellow pages of the telephone book under "Surgical Supplies." Or call the local ostomy organization to find out where supplies are available.  Do not let your ostomy supplies get low. Always order new pouches before you use the last one.  Reducing Odor Limit foods such as broccoli, cabbage, onions, fish, and garlic in your diet to help reduce odor. Each time you empty your pouch, carefully clean the opening of the pouch, both inside and outside, with toilet paper. Rinse your pouch 1 or 2 times daily after you empty it (see directions for emptying your pouch and going away from home). Add deodorant (such as Super Banish or Nullo) to your pouch. Use air deodorizers in your bathroom. Do not add aspirin to your pouch. Even though  aspirin can help prevent odor, it   could cause ulcers on your stoma.  When to call the doctor Call the doctor if you have any of the following symptoms: Purple, black, or white stoma Severe cramps lasting more than 6 hours Severe watery discharge from the stoma lasting more than 6 hours No output from the colostomy for 3 days Excessive bleeding from your stoma Swelling of your stoma to more than 1/2-inch larger than usual Pulling inward of your stoma below skin level Severe skin irritation or deep ulcers Bulging or other changes in your abdomen  When to call your ostomy nurse Call your ostomy/enterostomal therapy (WOCN) nurse if any of the following occurs: Frequent leaking of your pouching system Change in size or appearance of your stoma, causing discomfort or problems with your pouch Skin rash or rawness Weight gain or loss that causes problems with your pouch     FREQUENTLY ASKED QUESTIONS   Why haven't you met any of these folks who have an ostomy?  Well, maybe you have! You just did not recognize them because an ostomy doesn't show. It can be kept secret if you wish. Why, maybe some of your best friends, office associates or neighbors have an ostomy ... you never can tell. People facing ostomy surgery have many quality-of-life questions like: Will you bulge? Smell? Make noises? Will you feel waste leaving your body? Will you be a captive of the toilet? Will you starve? Be a social outcast? Get/stay married? Have babies? Easily bathe, go swimming, bend over?  OK, let's look at what you can expect:   Will you bulge?  Remember, without part of the intestine or bladder, and its contents, you should have a flatter tummy than before. You can expect to wear, with little exception, what you wore before surgery ... and this in-cludes tight clothing and bathing suits.   Will you smell?  Today, thanks to modern odor proof pouching systems, you can walk into an ostomy support group  meeting and not smell anything that is foul or offensive. And, for those with an ileostomy or colostomy who are concerned about odor when emptying their pouch, there are in-pouch deodorants that can be used to eliminate any waste odors that may exist.   Will you make noises?  Everyone produces gas, especially if they are an air-swallower. But intestinal sounds that occur from time to time are no differ-ent than a gurgling tummy, and quite often your clothing will muffle any sounds.   Will you feel the waste discharges?  For those with a colostomy or ileostomy there might be a slight pressure when waste leaves your body, but understand that the intestines have no nerve endings, so there will be no unpleasant sensations. Those with a urostomy will probably be unaware of any kidney drainage.   Will you be a captive of the toilet?  Immediately post-op you will spend more time in the bathroom than you will after your body recovers from surgery. Every person is different, but on average those with an ileostomy or urostomy may empty their pouches 4 to 6 times a day; a little  less if you have a colostomy. The average wear time between pouch system changes is 3 to 5 days and the changing process should take less than 30 minutes.   Will I need to be on a special diet? Most people return to their normal diet when they have recovered from surgery. Be sure to chew your food well, eat a well-balanced diet and drink plenty of fluids. If   you experience problems with a certain food, wait a couple of weeks and try it again.  Will there be odor and noises? Pouching systems are designed to be odor-proof or odor-resistant. There are deodorants that can be used in the pouch. Medications are also available to help reduce odor. Limit gas-producing foods and carbonated beverages. You will experience less gas and fewer noises as you heal from surgery.  How much time will it take to care for my ostomy? At first, you may  spend a lot of time learning about your ostomy and how to take care of it. As you become more comfortable and skilled at changing the pouching system, it will take very little time to care for it.   Will I be able to return to work? People with ostomies can perform most jobs. As soon as you have healed from surgery, you should be able to return to work. Heavy lifting (more than 10 pounds) may be discouraged.   What about intimacy? Sexual relationships and intimacy are important and fulfilling aspects of your life. They should continue after ostomy surgery. Intimacy-related concerns should be discussed openly between you and your partner.   Can I wear regular clothing? You do not need to wear special clothing. Ostomy pouches are fairly flat and barely noticeable. Elastic undergarments will not hurt the stoma or prevent the ostomy from functioning.   Can I participate in sports? An ostomy should not limit your involvement in sports. Many people with ostomies are runners, skiers, swimmers or participate in other active lifestyles. Talk with your caregiver first before doing heavy physical activity.  Will you starve?  Not if you follow doctor's orders at each stage of your post-op adjustment. There is no such thing as an "ostomy diet". Some people with an ostomy will be able to eat and tolerate anything; others may find diffi-culty with some foods. Each person is an individual and must determine, by trial, what is best for them. A good practice for all is to drink plenty of water.   Will you be a social outcast?  Have you met anyone who has an ostomy and is a social outcast? Why should you be the first? Only your attitude and self image will effect how you are treated. No confi-dent person is an outcast.    PROFESSIONAL HELP   Resources are available if you need help or have questions about your ostomy.   Specially trained nurses called Wound, Ostomy Continence Nurses (WOCN) are available for  consultation in most major medical centers.  Consider getting an ostomy consult at an outpatient ostomy clinic.   [facility] has an [facility] run by an WOCN ostomy nurse at the  Hospital campus.  [phone]. [facility] can help set up an appointment   The [employer] (UOA) is a group made up of many local chapters throughout the [place]. These local groups hold meetings and provide support to prospective and existing ostomates. They sponsor educational events and have qualified visitors to make personal or telephone visits. Contact the UOA for the chapter nearest you and for other educational publications.  More detailed information can be found in Colostomy Guide, a publication of the [employer] (UOA). Contact UOA at 1-800-826-0826 or visit their web site at www.uoaa.org. The website contains links to other sites, suppliers and resources.  [facility] Secure Start Services: Start at the website to enlist for support.  Your Wound Ostomy (WOCN) nurse may have started this   process. https://[email or web address].[employer].com/en/securestart Secure Start services are designed to support people as they live their lives with an ostomy or neurogenic bladder. Enrolling is easy and at no cost to the patient. We realize that each person's needs and life journey are different. Through Secure Start services, we want to help people live their life, their way.  #######################################################  

## 2021-12-29 LAB — BASIC METABOLIC PANEL
Anion gap: 6 (ref 5–15)
BUN: 19 mg/dL (ref 8–23)
CO2: 27 mmol/L (ref 22–32)
Calcium: 8.7 mg/dL — ABNORMAL LOW (ref 8.9–10.3)
Chloride: 106 mmol/L (ref 98–111)
Creatinine, Ser: 1.13 mg/dL (ref 0.61–1.24)
GFR, Estimated: >60 mL/min (ref >60)
Glucose, Bld: 116 mg/dL — ABNORMAL HIGH (ref 70–99)
Potassium: 4.1 mmol/L (ref 3.5–5.1)
Sodium: 139 mmol/L (ref 135–145)

## 2021-12-29 LAB — CBC
HCT: 42.6 % (ref 39.0–52.0)
Hemoglobin: 14.5 g/dL (ref 13.0–17.0)
MCH: 31.3 pg (ref 26.0–34.0)
MCHC: 34 g/dL (ref 30.0–36.0)
MCV: 92 fL (ref 80.0–100.0)
Platelets: 142 10*3/uL — ABNORMAL LOW (ref 150–400)
RBC: 4.63 MIL/uL (ref 4.22–5.81)
RDW: 13.3 % (ref 11.5–15.5)
WBC: 10.2 10*3/uL (ref 4.0–10.5)
nRBC: 0 % (ref 0.0–0.2)

## 2021-12-29 MED ORDER — OXYCODONE HCL 5 MG PO TABS: 5.0000 mg | ORAL_TABLET | ORAL | 0 refills | Status: AC | PRN

## 2021-12-29 NOTE — Discharge Summary (Signed)
Physician Discharge Summary  Patient ID: [name] [name] MRN: [id number] DOB/AGE: [birth year] 61 y.o.  Admit date: 12/27/2021 Discharge date: 12/29/2021  Admission Diagnoses: Diverticular disease  Discharge Diagnoses:  Principal Problem:   Diverticular disease   Discharged Condition: good  Hospital Course: Patient underwent laparoscopic robotic sigmoid colectomy on 12/27/2021 by Dr. Marcello Moores.  He did well.  His bowels are moving on postop day 1.  On postop day 2 was ambulating the halls tolerating his diet and had good pain control.  He was voiding.  He was discharged home on postoperative day 1 in good condition      Treatments: surgery: Robotic simple colectomy  Discharge Exam: Blood pressure 134/74, pulse 69, temperature 98.3 F (36.8 C), temperature source Oral, resp. rate 16, height 5' 10.5" (1.791 m), weight 116 kg, SpO2 97 %. General appearance: alert and cooperative Resp: clear to auscultation bilaterally Cardio: Normal sinus rhythm Incision/Wound: Incisions covered with honeycomb dressings.  Clean.  Obese.  Soft.  Appropriate tender without any significant distention.  Disposition: Discharge disposition: 01-Home or Self Care       Discharge Instructions     Diet - low sodium heart healthy   Complete by: As directed    Increase activity slowly   Complete by: As directed       Allergies as of 12/29/2021   No Known Allergies      Medication List     TAKE these medications    acidophilus Caps capsule Take 1 capsule by mouth daily.   allopurinol 300 MG tablet Commonly known as: ZYLOPRIM Take 300 mg by mouth daily.   aspirin EC 81 MG tablet Take 81 mg by mouth daily. Swallow whole.   colchicine 0.6 MG tablet Take 1 tablet (0.6 mg total) by mouth daily for 2 days.   hydrochlorothiazide 25 MG tablet Commonly known as: HYDRODIURIL TAKE 1 TABLET (25 MG TOTAL) BY MOUTH DAILY.   lisinopril 20 MG tablet Commonly known as: ZESTRIL TAKE 1 TABLET BY  MOUTH EVERY DAY   meloxicam 15 MG tablet Commonly known as: MOBIC Take 15 mg by mouth daily.   metoprolol succinate 25 MG 24 hr tablet Commonly known as: TOPROL-XL Take 25 mg by mouth daily.   NON FORMULARY Pt uses a cpap nightly   oxyCODONE 5 MG immediate release tablet Commonly known as: Roxicodone Take 1 tablet (5 mg total) by mouth every 4 (four) hours as needed for severe pain.   tadalafil 5 MG tablet Commonly known as: CIALIS Take 5 mg by mouth daily.   traMADol 50 MG tablet Commonly known as: ULTRAM Take 1-2 tablets (50-100 mg total) by mouth every 6 (six) hours as needed (mild pain).        Follow-up Information     Leighton Ruff, MD. Schedule an appointment as soon as possible for a visit in 2 week(s).   Specialties: General Surgery, Colon and Rectal Surgery Contact information: [address] [place] [id number] [phone]                 Signed: Joyice Faster Charleen Madera MD 12/29/2021, 9:44 AM

## 2021-12-29 NOTE — Progress Notes (Signed)
Assessment unchanged. Pt verbalized understanding of dc instructions through teach back including medications, follow up care and when to call the doctor. Discharged via wc to front entrance accompanied by NT.

## 2021-12-29 NOTE — Plan of Care (Signed)

## 2022-01-01 LAB — SURGICAL PATHOLOGY
